# Patient Record
Sex: Male | Born: 1950 | Race: White | Hispanic: No | Marital: Single | Smoking: Current every day smoker
Health system: Southern US, Community
[De-identification: ages and names within clinical notes are randomized; demographics above are authoritative.]

## PROBLEM LIST (undated history)

## (undated) DIAGNOSIS — E079 Disorder of thyroid, unspecified: Secondary | ICD-10-CM

## (undated) DIAGNOSIS — I219 Acute myocardial infarction, unspecified: Secondary | ICD-10-CM

## (undated) DIAGNOSIS — I4891 Unspecified atrial fibrillation: Secondary | ICD-10-CM

## (undated) DIAGNOSIS — M549 Dorsalgia, unspecified: Secondary | ICD-10-CM

## (undated) DIAGNOSIS — E785 Hyperlipidemia, unspecified: Secondary | ICD-10-CM

## (undated) DIAGNOSIS — J449 Chronic obstructive pulmonary disease, unspecified: Secondary | ICD-10-CM

## (undated) DIAGNOSIS — I509 Heart failure, unspecified: Secondary | ICD-10-CM

## (undated) DIAGNOSIS — Z951 Presence of aortocoronary bypass graft: Secondary | ICD-10-CM

## (undated) HISTORY — PX: CORONARY ARTERY BYPASS GRAFT: SHX141

## (undated) HISTORY — PX: SHOULDER SURGERY: SHX246

## (undated) HISTORY — PX: CORONARY ANGIOPLASTY WITH STENT PLACEMENT: SHX49

## (undated) HISTORY — PX: CORONARY ANGIOPLASTY: SHX604

## (undated) HISTORY — PX: CHOLECYSTECTOMY: SHX55

---

## 2008-02-22 ENCOUNTER — Ambulatory Visit: Payer: Self-pay | Admitting: Gastroenterology

## 2008-02-26 ENCOUNTER — Ambulatory Visit: Payer: Self-pay | Admitting: Orthopedic Surgery

## 2008-03-08 HISTORY — PX: JOINT REPLACEMENT: SHX530

## 2008-04-22 ENCOUNTER — Ambulatory Visit: Payer: Self-pay | Admitting: Orthopedic Surgery

## 2008-04-30 ENCOUNTER — Inpatient Hospital Stay: Payer: Self-pay | Admitting: Orthopedic Surgery

## 2008-07-30 ENCOUNTER — Ambulatory Visit: Payer: Self-pay | Admitting: Orthopedic Surgery

## 2008-08-06 ENCOUNTER — Ambulatory Visit: Payer: Self-pay | Admitting: Orthopedic Surgery

## 2009-03-05 ENCOUNTER — Ambulatory Visit: Payer: Self-pay | Admitting: Orthopedic Surgery

## 2009-03-13 ENCOUNTER — Ambulatory Visit: Payer: Self-pay | Admitting: Orthopedic Surgery

## 2010-02-10 ENCOUNTER — Encounter: Payer: Self-pay | Admitting: Family Medicine

## 2010-03-08 ENCOUNTER — Encounter: Payer: Self-pay | Admitting: Family Medicine

## 2010-03-18 ENCOUNTER — Ambulatory Visit: Payer: Self-pay | Admitting: Cardiovascular Disease

## 2010-03-31 ENCOUNTER — Ambulatory Visit: Payer: Self-pay | Admitting: Family Medicine

## 2010-06-05 ENCOUNTER — Ambulatory Visit: Payer: Self-pay | Admitting: Orthopedic Surgery

## 2010-06-10 ENCOUNTER — Ambulatory Visit: Payer: Self-pay | Admitting: Orthopedic Surgery

## 2010-07-15 ENCOUNTER — Encounter: Payer: Self-pay | Admitting: Orthopedic Surgery

## 2011-02-23 ENCOUNTER — Ambulatory Visit: Payer: Self-pay | Admitting: Family Medicine

## 2011-03-26 ENCOUNTER — Encounter: Payer: Self-pay | Admitting: Family Medicine

## 2011-04-09 ENCOUNTER — Encounter: Payer: Self-pay | Admitting: Family Medicine

## 2011-05-07 ENCOUNTER — Encounter: Payer: Self-pay | Admitting: Family Medicine

## 2011-09-07 ENCOUNTER — Ambulatory Visit: Payer: Self-pay | Admitting: Pain Medicine

## 2011-09-13 ENCOUNTER — Ambulatory Visit: Payer: Self-pay | Admitting: Pain Medicine

## 2011-09-16 ENCOUNTER — Ambulatory Visit: Payer: Self-pay | Admitting: Pain Medicine

## 2011-10-12 ENCOUNTER — Ambulatory Visit: Payer: Self-pay | Admitting: Pain Medicine

## 2011-10-20 ENCOUNTER — Ambulatory Visit: Payer: Self-pay | Admitting: Pain Medicine

## 2011-11-16 ENCOUNTER — Ambulatory Visit: Payer: Self-pay | Admitting: Pain Medicine

## 2011-11-24 ENCOUNTER — Ambulatory Visit: Payer: Self-pay | Admitting: Pain Medicine

## 2011-11-30 ENCOUNTER — Ambulatory Visit: Payer: Self-pay | Admitting: Pain Medicine

## 2011-12-23 ENCOUNTER — Ambulatory Visit: Payer: Self-pay | Admitting: Pain Medicine

## 2012-01-03 ENCOUNTER — Ambulatory Visit: Payer: Self-pay | Admitting: Pain Medicine

## 2012-03-14 ENCOUNTER — Ambulatory Visit: Payer: Self-pay | Admitting: Pain Medicine

## 2012-03-22 ENCOUNTER — Ambulatory Visit: Payer: Self-pay | Admitting: Pain Medicine

## 2012-05-16 ENCOUNTER — Ambulatory Visit: Payer: Self-pay | Admitting: Pain Medicine

## 2012-06-07 ENCOUNTER — Ambulatory Visit: Payer: Self-pay | Admitting: Pain Medicine

## 2012-08-03 ENCOUNTER — Ambulatory Visit: Payer: Self-pay | Admitting: Pain Medicine

## 2012-08-16 ENCOUNTER — Ambulatory Visit: Payer: Self-pay | Admitting: Pain Medicine

## 2012-09-07 ENCOUNTER — Emergency Department: Payer: Self-pay | Admitting: Emergency Medicine

## 2012-09-07 LAB — CBC WITH DIFFERENTIAL/PLATELET
Basophil #: 0.1 10*3/uL (ref 0.0–0.1)
Eosinophil #: 0 10*3/uL (ref 0.0–0.7)
Eosinophil %: 0.2 %
HGB: 14.5 g/dL (ref 13.0–18.0)
Lymphocyte #: 1.4 10*3/uL (ref 1.0–3.6)
Lymphocyte %: 11.5 %
MCHC: 33.9 g/dL (ref 32.0–36.0)
Monocyte %: 8.5 %
Neutrophil #: 9.7 10*3/uL — ABNORMAL HIGH (ref 1.4–6.5)
Neutrophil %: 79.2 %
RBC: 4.96 10*6/uL (ref 4.40–5.90)
RDW: 13.6 % (ref 11.5–14.5)
WBC: 12.2 10*3/uL — ABNORMAL HIGH (ref 3.8–10.6)

## 2012-09-07 LAB — SYNOVIAL CELL COUNT + DIFF, W/ CRYSTALS
Basophil: 0 %
Nucleated Cell Count: 20313 /mm3
Other Cells BF: 0 %
Other Mononuclear Cells: 8 %

## 2012-09-07 LAB — BASIC METABOLIC PANEL
Anion Gap: 7 (ref 7–16)
Calcium, Total: 9.4 mg/dL (ref 8.5–10.1)
Co2: 24 mmol/L (ref 21–32)
Creatinine: 1.32 mg/dL — ABNORMAL HIGH (ref 0.60–1.30)
EGFR (African American): >60
Glucose: 130 mg/dL — ABNORMAL HIGH (ref 65–99)

## 2012-09-11 LAB — AEROBIC CULTURE

## 2012-10-20 ENCOUNTER — Emergency Department: Payer: Self-pay | Admitting: Emergency Medicine

## 2012-10-20 LAB — COMPREHENSIVE METABOLIC PANEL
Anion Gap: 9 (ref 7–16)
BUN: 13 mg/dL (ref 7–18)
Bilirubin,Total: 0.2 mg/dL (ref 0.2–1.0)
Calcium, Total: 9.5 mg/dL (ref 8.5–10.1)
Co2: 22 mmol/L (ref 21–32)
Creatinine: 0.98 mg/dL (ref 0.60–1.30)
EGFR (African American): >60
EGFR (Non-African Amer.): >60
Glucose: 127 mg/dL — ABNORMAL HIGH (ref 65–99)
Osmolality: 270 (ref 275–301)
SGOT(AST): 20 U/L (ref 15–37)
SGPT (ALT): 19 U/L (ref 12–78)

## 2012-10-20 LAB — CBC
HCT: 44.2 % (ref 40.0–52.0)
HGB: 15.3 g/dL (ref 13.0–18.0)
MCH: 29.3 pg (ref 26.0–34.0)
MCHC: 34.5 g/dL (ref 32.0–36.0)
MCV: 85 fL (ref 80–100)

## 2012-10-20 LAB — TROPONIN I: Troponin-I: 0.15 ng/mL — ABNORMAL HIGH

## 2012-10-25 DIAGNOSIS — E039 Hypothyroidism, unspecified: Secondary | ICD-10-CM | POA: Insufficient documentation

## 2012-10-25 DIAGNOSIS — I213 ST elevation (STEMI) myocardial infarction of unspecified site: Secondary | ICD-10-CM | POA: Insufficient documentation

## 2012-11-01 DIAGNOSIS — I2510 Atherosclerotic heart disease of native coronary artery without angina pectoris: Secondary | ICD-10-CM | POA: Insufficient documentation

## 2012-11-01 DIAGNOSIS — E782 Mixed hyperlipidemia: Secondary | ICD-10-CM | POA: Insufficient documentation

## 2012-11-26 ENCOUNTER — Inpatient Hospital Stay: Payer: Self-pay | Admitting: Internal Medicine

## 2012-11-26 LAB — CBC
MCHC: 33.4 g/dL (ref 32.0–36.0)
Platelet: 382 10*3/uL (ref 150–440)
RDW: 16.3 % — ABNORMAL HIGH (ref 11.5–14.5)
WBC: 8.6 10*3/uL (ref 3.8–10.6)

## 2012-11-26 LAB — COMPREHENSIVE METABOLIC PANEL
Albumin: 3.4 g/dL (ref 3.4–5.0)
Alkaline Phosphatase: 133 U/L (ref 50–136)
Anion Gap: 8 (ref 7–16)
BUN: 13 mg/dL (ref 7–18)
Calcium, Total: 9.5 mg/dL (ref 8.5–10.1)
Creatinine: 1 mg/dL (ref 0.60–1.30)
EGFR (Non-African Amer.): >60
Glucose: 134 mg/dL — ABNORMAL HIGH (ref 65–99)
Potassium: 3.8 mmol/L (ref 3.5–5.1)
SGOT(AST): 14 U/L — ABNORMAL LOW (ref 15–37)
SGPT (ALT): 19 U/L (ref 12–78)
Sodium: 134 mmol/L — ABNORMAL LOW (ref 136–145)
Total Protein: 6.9 g/dL (ref 6.4–8.2)

## 2012-11-26 LAB — CK TOTAL AND CKMB (NOT AT ARMC): CK-MB: 1.1 ng/mL (ref 0.5–3.6)

## 2012-11-27 DIAGNOSIS — R079 Chest pain, unspecified: Secondary | ICD-10-CM

## 2012-11-27 LAB — LIPID PANEL
Cholesterol: 79 mg/dL (ref 0–200)
Ldl Cholesterol, Calc: 34 mg/dL (ref 0–100)
Triglycerides: 112 mg/dL (ref 0–200)
VLDL Cholesterol, Calc: 22 mg/dL (ref 5–40)

## 2012-11-27 LAB — TROPONIN I
Troponin-I: 6.8 ng/mL — ABNORMAL HIGH
Troponin-I: 3.4 ng/mL — ABNORMAL HIGH

## 2012-11-27 LAB — CK TOTAL AND CKMB (NOT AT ARMC)
CK, Total: 107 U/L (ref 35–232)
CK-MB: 11.6 ng/mL — ABNORMAL HIGH (ref 0.5–3.6)

## 2012-11-28 LAB — HEMOGLOBIN: HGB: 11.1 g/dL — ABNORMAL LOW (ref 13.0–18.0)

## 2013-11-27 IMAGING — CR DG CHEST 2V
1 series · 2 of 2 positions shown · non-contrast
Comparison: none

REASON FOR EXAM: Chest Pain
COMMENTS:

PROCEDURE:     DXR - DXR CHEST PA (OR AP) AND LATERAL  - November 26, 2012  [DATE]
RESULT:     There is lung base haziness on the right concerning for
atelectasis. The lungs otherwise appear to be grossly clear. Cardiac
silhouette is normal. The bony and mediastinal structures are unremarkable.

[Series 1: w chest pa · 0.14mm/px · 2 of 2 slices shown]
[im 1/2]
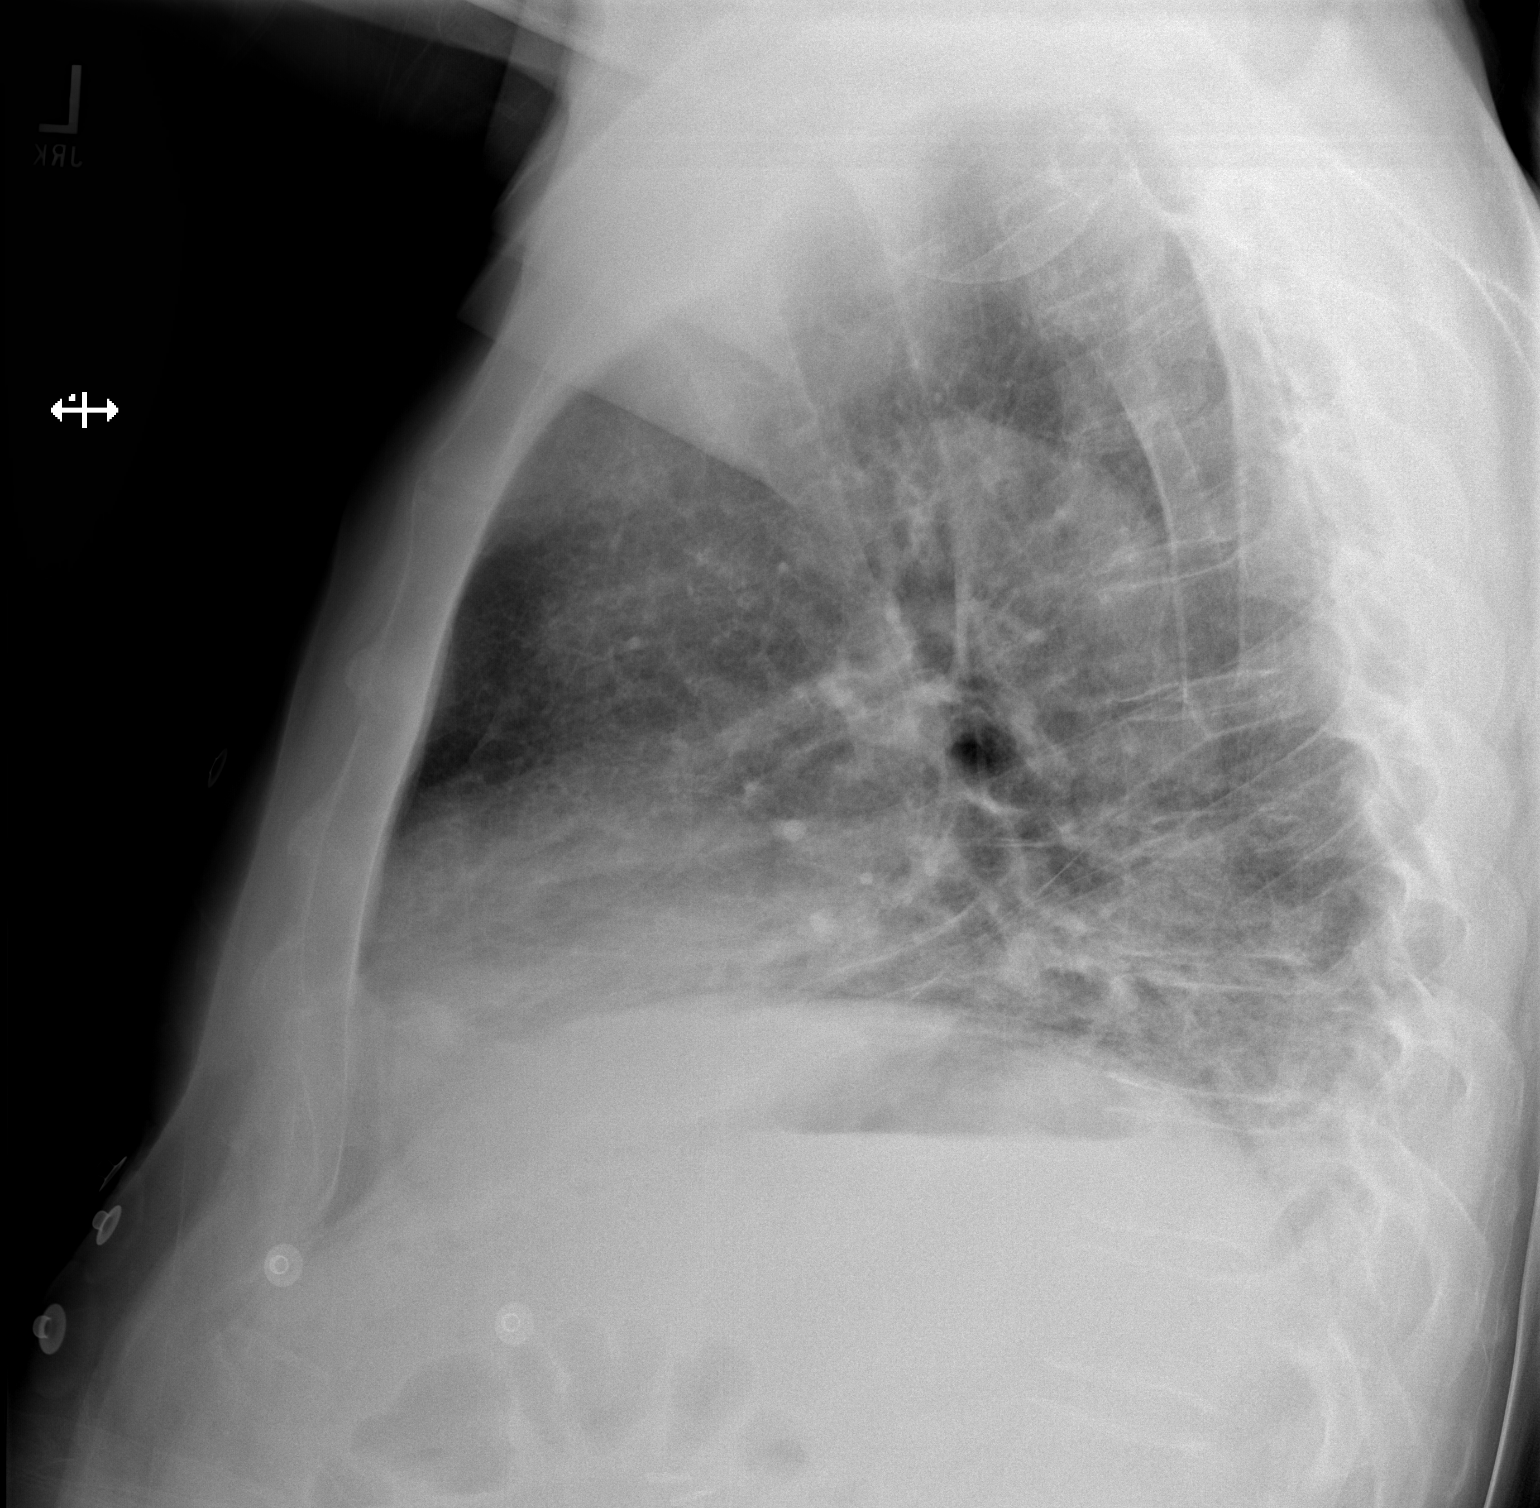
[im 2/2]
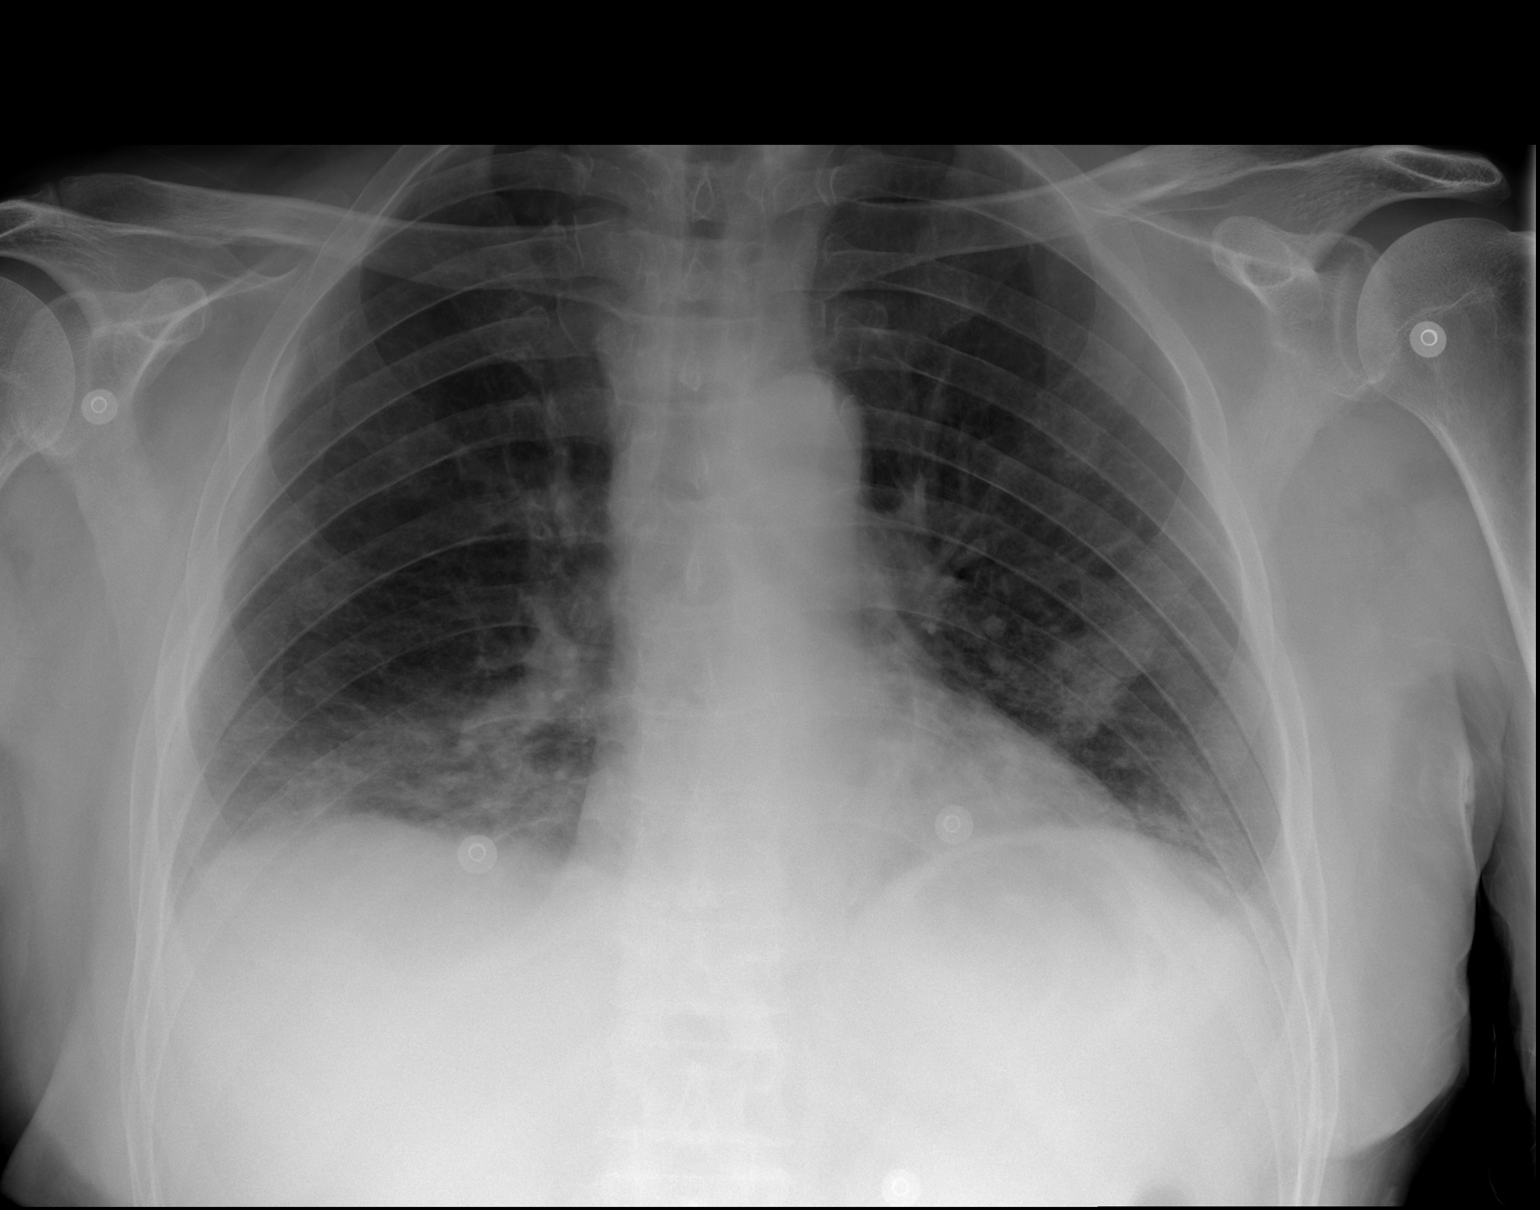

[2 of 2 positions shown; findings below may reference images not displayed]

IMPRESSION: 1. Lung base hazy density concerning for atelectasis. Minimal infiltrate at
the right lung base is not excluded.

[REDACTED]

## 2014-03-08 HISTORY — PX: JOINT REPLACEMENT: SHX530

## 2014-04-16 ENCOUNTER — Ambulatory Visit: Payer: Self-pay | Admitting: Orthopedic Surgery

## 2014-05-07 ENCOUNTER — Ambulatory Visit: Payer: Self-pay | Admitting: Orthopedic Surgery

## 2014-05-16 ENCOUNTER — Inpatient Hospital Stay: Payer: Self-pay | Admitting: Orthopedic Surgery

## 2014-05-31 ENCOUNTER — Emergency Department: Payer: Self-pay | Admitting: Emergency Medicine

## 2014-05-31 LAB — CBC WITH DIFFERENTIAL/PLATELET
Basophil #: 0 10*3/uL (ref 0.0–0.1)
Basophil %: 0.4 %
EOS ABS: 0.3 10*3/uL (ref 0.0–0.7)
Eosinophil %: 3.4 %
HCT: 38.2 % — AB (ref 40.0–52.0)
HGB: 13.2 g/dL (ref 13.0–18.0)
LYMPHS ABS: 2.2 10*3/uL (ref 1.0–3.6)
LYMPHS PCT: 24.4 %
MCH: 31.5 pg (ref 26.0–34.0)
MCHC: 34.6 g/dL (ref 32.0–36.0)
MCV: 91 fL (ref 80–100)
Monocyte #: 0.8 x10 3/mm (ref 0.2–1.0)
Monocyte %: 8.9 %
NEUTROS ABS: 5.6 10*3/uL (ref 1.4–6.5)
Neutrophil %: 62.9 %
Platelet: 485 10*3/uL — ABNORMAL HIGH (ref 150–440)
RBC: 4.2 10*6/uL — ABNORMAL LOW (ref 4.40–5.90)
RDW: 14.5 % (ref 11.5–14.5)
WBC: 9 10*3/uL (ref 3.8–10.6)

## 2014-05-31 LAB — BASIC METABOLIC PANEL
Anion Gap: 6 — ABNORMAL LOW (ref 7–16)
BUN: 17 mg/dL
CHLORIDE: 105 mmol/L
CO2: 23 mmol/L
CREATININE: 0.93 mg/dL
Calcium, Total: 9.1 mg/dL
GLUCOSE: 101 mg/dL — AB
Potassium: 3.8 mmol/L
Sodium: 134 mmol/L — ABNORMAL LOW

## 2014-05-31 LAB — TROPONIN I: Troponin-I: <0.03 ng/mL

## 2014-06-28 NOTE — H&P (Signed)
PATIENT NAME:  Bryce Haney, Bryce Haney MR#:  [id number] DATE OF BIRTH:  [birth year]  DATE OF ADMISSION: 11/26/2012  PRIMARY CARE PHYSICIAN: Salome Holmes, M.D.  REFERRING PHYSICIAN: Graciella Freer, M.D.  CHIEF COMPLAINT: Chest pain.   HISTORY OF PRESENT ILLNESS: The patient is 64 year old Caucasian male with past medical history of coronary artery disease, status post STEMI and stent placement on August 15 at [facility], COPD, degenerative joint disease, hypothyroidism, who is presenting to the ER with a chief complaint of chest pain. The patient is reporting that he started having chest pain at around 5:30 p.m. yesterday while he was resting. It was associated with nausea and diaphoresis. Denies any dizziness or loss of consciousness. Pain was on the left side of the chest which was pressure like and subsequently it was sharp in nature, associated with left shoulder weakness and numbness. He took aspirin yesterday morning and tried sublingual nitroglycerin  after he started having chest pressure with no significant improvement. As the chest pressure with pain was persistent in nature he called EMS. As soon as EMS came they gave 3 more aspirin and sublingual nitroglycerin. Subsequently his pain subsided from 10/10 to 4/10, and the patient was brought into the ER. In the ER, the patient's initial troponin is 0.03 but subsequent  one is at 1.20. Hospitalist team is called to admit the patient. A 12-lead EKG did not reveal any acute ST-T wave changes. During my examination, the patient was complaining of chest pressure which was 4/10. Repeat EKG did not reveal any acute ST-T wave changes either. Nitro paste is attached to his anterior chest wall and Lovenox 1 mg/kg subcutaneous x1 was given as troponin trending up. As there was a concern for possible re-stent stenosis call was placed to on-call cardiologist, Dr. Saralyn Pilar, and discussed with him regarding the patient's care. Dr. Saralyn Pilar has recommended to admit the  patient to [facility] as the patient did not have any acute ST-T wave changes. The patient is reporting that his allergic to morphine. After attaching 1 inch of nitro paste patient's chest pain significantly improved. No family members are at bedside.   PAST MEDICAL HISTORY: Coronary artery disease status post STEMI and one stent placement 1 month ago at [facility] on August 15, COPD . The patient quit smoking on August 15. Degenerative joint disease, hypothyroidism.   PAST SURGICAL HISTORY: Right shoulder surgery, right knee repair, stent placement at [facility] on August 15.   ALLERGIES:  MORPHINE.   PSYCHOSOCIAL HISTORY: Lives alone. He quit smoking on August 15.  Prior to that, he smoked 1 pack a day for 16 years. Denies any alcohol or illicit drug usage.   FAMILY HISTORY: Both parents have heart problems.   HOME MEDICATIONS: Plavix 75 mg once daily, aspirin 81 mg once daily, mirtazapine 30 mg once daily, levothyroxine 175 mcg once daily, gabapentin 300 mg 1 capsule p.o. once daily, Coreg 6.25 mg twice a day, atorvastatin 80 mg once daily, aspirin 81 mg once daily.   REVIEW OF SYSTEMS:   CONSTITUTIONAL: Denies any fever or fatigue.  EYES: Denies blurry vision or glaucoma.  EARS, NOSE, THROAT: No epistaxis or discharge.  RESPIRATION: Denies cough. Has a chronic history of COPD.  CARDIOVASCULAR: Complaining of chest pain with pressure. Denies any edema or syncope.  GASTROINTESTINAL: Denies nausea, vomiting, diarrhea, abdominal pain.  GENITOURINARY: No dysuria or hematuria.  ENDOCRINE: Denies polyuria, nocturia. Has chronic hypothyroidism.  HEMATOLOGIC AND LYMPHATIC: Denies anemia, easy bruising, bleeding.  INTEGUMENTARY: No acne, rash, lesions.  MUSCULOSKELETAL: Has chronic back pain and sees pain management doctor. Denies any gout.  NEUROLOGIC: No vertigo or ataxia.  PSYCHIATRIC: No ADD, OCD.   PHYSICAL EXAMINATION:  VITAL SIGNS: Temperature 98.2, pulse 61, respirations 18, blood pressure  146/71, pulse oximetry 99% on 2 liters.  GENERAL APPEARANCE: Not under acute distress. Moderately built and nourished, answering questions appropriately.   HEENT: Normocephalic, atraumatic. Pupils are equally reacting to light and accommodation. No scleral icterus. No conjunctival injection. No sinus tenderness. No postnasal drip. Moist mucous membranes.  NECK: Supple. No JVD. No thyromegaly.  LUNGS: Clear to auscultation bilaterally. No wheezing.  CARDIAC: S1 and S2 normal.  Regular rate and rhythm. No murmurs and gallops.No chest wall tenderness on palpation.  GASTROINTESTINAL: Soft. Bowel sounds are positive in all four quadrants. Nontender, nondistended. No hepatosplenomegaly.  NEUROLOGIC: Awake, alert, oriented x3. Motor and sensory grossly intact. Reflexes are 2+.  EXTREMITIES: No edema. No cyanosis. No clubbing.  SKIN: Warm to touch. Normal turgor. No rashes. No lesions.  MUSCULOSKELETAL: No joint effusion, tenderness or erythema.   LABS AND IMAGING STUDIES: Chest x-ray within normal limits. A 12-lead EKG: Initial one was done at 19 hours 39 minutes which has revealed normal sinus rhythm at 62 beats per minute, normal PR and QRS intervals interval, nonspecific ST-T wave changes. Repeat EKG was done at three hours 34 minutes on September 22 which has revealed sinus bradycardia at 58 beats per minute but normal PR and QRS interval, nonspecific ST-T wave changes. CK total is 23, CPK-MB 1.1. Troponin 0.03. A repeat troponin is at 1.20. LFTs are within normal range. Serum glucose 154, BNP 240, BUN 13, creatinine 1.0, sodium 134, potassium 3.8, chloride 105, CO2 of 21. GFR greater than 60, serum osmolality 270. Calcium 9.5. WBC 8.6, hemoglobin 11.3, hematocrit 33.7, platelets 382.   ASSESSMENT AND PLAN: A 64 year old Caucasian male with a past medical history of ST- segment myocardial infarction, status post stent placement at [facility] on August is coming with chest pain, will be admitted with the  following assessment and plan.  1.  Non-ST-segment myocardial infarction. Will admit him to Critical Care Unit. We will cycle cardiac biomarkers. The patient will be on acute coronary syndrome protocol with oxygen, nitroglycerin, aspirin, beta blocker and statin. The patient is allergic to morphine. We will provide him Lovenox 1 mg/kg subcutaneous q.12 hours. Cardiology consult is placed and discussed with Dr. Saralyn Pilar who agrees with the current plan.  2.  Chronic obstructive pulmonary disease, not in acute exacerbation. We will provide him DuoNebs as needed basis.  3.  Hypothyroidism. Resume Synthroid.  4.  Degenerative joint disease. Pain management as needed.  5.  He is full code. The patient's nephew, Mr. Lanny Cramp, is his medical power of attorney.   Total critical care Time Spent: 60 minutes.   Diagnosis and plan of care was discussed in detail with the patient. He is aware of the plan.   ____________________________ Nicholes Mango, MD ag:ja D: 11/27/2012 01:22:33 ET T: 11/27/2012 06:03:04 ET JOB#: [id number]  cc: Nicholes Mango, MD, <Dictator> Isaias Cowman, MD Nicholes Mango MD ELECTRONICALLY SIGNED 12/08/2012 6:56

## 2014-06-28 NOTE — Consult Note (Signed)
PATIENT NAME:  Bryce Haney, DOWNS MR#:  [id number] DATE OF BIRTH:  07/08/1950  DATE OF CONSULTATION:  11/27/2012  REFERRING PHYSICIAN:  Dr. Margaretmary Eddy.  CONSULTING PHYSICIAN:  Dwayne D. Callwood, MD  INDICATION: Non-Q-wave myocardial infarction.   PRIMARY CARE PHYSICIAN: Salome Holmes, MD.  HISTORY OF PRESENT ILLNESS: Bryce Haney is a 64 year old white male with recent past history of acute myocardial infarction with stent placement at [facility] August 15. He has COPD, DJD and hypothyroidism. He did reasonably well at [facility]. He was discharged home and followed up with Dr. Nehemiah Massed as an outpatient and was scheduled for outpatient cardiac studies. Today, he was admitted. This time he complained of left arm numbness and chest pain, not as bad as when he had his STEMI. He had some nausea and diaphoresis. Finally, EMS brought him in. He got aspirin and sublingual nitroglycerin. Pain improved from 10 out of 10 to 4 out of 10. Initial troponin was 0.03. It subsequently increased to 1.2 and increased even higher to 6. EKG had nonspecific ST-T wave changes. He was placed on anticoagulation and advised further evaluation and management. Left arm numbness and pain improved, but with positive troponins and recent cardiac history, he was advised to be admitted for further evaluation and care.   REVIEW OF SYSTEMS: Denies blackout spells or syncope. He has had mild nausea. No vomiting. No fever, no chills. He has had some sweats. No weight loss. No weight gain. No hemoptysis or hematemesis. No bright red blood per rectum. No vision change or hearing change. Denies sputum production or cough.   PAST MEDICAL HISTORY: Notable for coronary artery disease, status post STEMI, past history of smoking, DJD and hypothyroidism.   PAST SURGICAL HISTORY: Angioplasty and stenting at [facility] August 15, right shoulder surgery and knee repair.   ALLERGIES: MORPHINE.   SOCIAL HISTORY: Lives alone. Quit smoking about a month ago. Denies  significant alcohol consumption.   FAMILY HISTORY: Heart disease.   MEDICATIONS: Plavix 75 mg a day, aspirin 81 mg day, mirtazapine 30 mg a day, levothyroxine 175 mcg a day, gabapentin 300 mg once a day, Coreg 6.25 mg twice a day and atorvastatin 80 mg once a day.   PHYSICAL EXAMINATION:  VITAL SIGNS: Blood pressure 150/70, pulse 65, respiratory rate 18, afebrile.  HEENT: Normocephalic, atraumatic. Pupils equal and reactive to light.  NECK: Supple. No significant JVD, bruits or adenopathy.  LUNGS: Clear to auscultation and percussion. No significant wheezing. He had some mild rhonchi, no rales.  HEART: Regular rate and rhythm. Systolic ejection murmur left sternal border. Positive S4.  ABDOMEN: Benign.  EXTREMITIES: Within normal limits.  NEUROLOGIC: Intact.  SKIN: Normal.   LABORATORIES: Again, his EKG showed normal sinus rhythm, rate of 70, nonspecific ST-T changes. Chest x-ray unremarkable. Total CK was normal. Initial troponin 0.03, increased to 1.2. LFTs were normal. Glucose 154. BNP 240. MET-B unremarkable. White count 8.6, hemoglobin 11, hematocrit 33.7, platelet count 382. Follow up troponin was 6.   ASSESSMENT:  1.  Non-Q-wave myocardial infarction.  2.  Unstable angina.  3.  Coronary artery disease. 4.  Chronic obstructive pulmonary disease.  5.  Hypothyroidism. 6.  Degenerative joint disease.  7.  Past history of smoking. 8.  Recent history of angioplasty and stent. The patient states to be complaint with Plavix.   PLAN: I agree with admit. Continue to rule out for myocardial infarction. Followup cardiac enzymes. Followup EKGs. Would switch to heparin and Integrilin. Echocardiogram will be helpful. Continue beta blockers for now. Continue  Lipitor for lipid management. Continue ACE inhibitor therapy. Would also consider proceeding with cardiac catheter since he had non-Q-wave myocardial infarction and recent myocardial infarction. Will cath within intention to treat and  intervene as necessary. Would also recommend patient continue to refrain from smoking. We will base further evaluation on results of the catheterization.  ____________________________ Loran Senters Clayborn Bigness, MD ddc:aw D: 11/27/2012 12:33:12 ET T: 11/27/2012 13:15:28 ET JOB#: [id number]  cc: Dwayne D. Clayborn Bigness, MD, <Dictator> Yolonda Kida MD ELECTRONICALLY SIGNED 12/27/2012 10:34

## 2014-06-28 NOTE — Discharge Summary (Signed)
PATIENT NAME:  Bryce Haney, Bryce Haney MR#:  [id number] DATE OF BIRTH:  [birth year]  DATE OF ADMISSION:  11/26/2012 DATE OF DISCHARGE: 11/28/2012   DISCHARGE DIAGNOSES: 1. Chest pain secondary to angina.  2. Coronary artery disease with recent stent.  3. Hypertension.  4. Hypothyroidism.  5. Hyperlipidemia.  6. Depression.   DISCHARGE MEDICATIONS: 1. Neurontin 300 mg p.o. daily.  2. Ranitidine 150 mg p.o. daily.  3 omeprazole  20 mg b.i.d.  4. Levothyroxine 175 mcg p.o. daily.  >  mirtazapine  30 mg  p.o. daily.  6. Atorvastatin 80 mg p.o. daily.  7. Plavix 75 mg p.o. daily.  8. Lisinopril 5 mg p.o. daily.  9. Ferrous sulfate 325 mg 2 tablets p.o.  b.i.d.  10. Coreg 6.25 mg p.o. b.i.d.  11. Aspirin 81 mg p.o. daily.  12. Nitrostat sublingual as needed for chest pain.   CONSULTATIONS: Cardiology consult with Dr. Clayborn Bigness.   DIET: Low sodium diet.  PRIMARY DOCTOR: Dr. Salome Holmes  PROCEDURES: Cardiac cath report.  Angiogram report says EF 50% to 55% with no significant obstructive coronary artery disease. The patient has a patent stent to LAD and normal ejection fraction. The patient advised to have medical management and okay to discharge home as per the Cardiology, and the patient advised to follow up with the cardiologist in [place] in about 10 days and  also follow with primary doctor.   HOSPITAL COURSE: A 64 year old male patient admitted for chest pain. The patient had a recent stent in 10/20/2012 at [facility].  PMH significant for COPD, hypothyroidism, and depression, and the patient had left-sided chest pain and heaviness. The patient's initial troponins were negative and second troponin went up to 1.20, and third one is 6.80. CPK-MB was also elevated to 18.5 CK total was 157. The patient was admitted to ICU, started on heparin drip and also Integrilin drip. The patient had an angiogram yesterday which showed normal coronaries and also patent stent to LAD. The patient's troponins did come  down to 3.4 yesterday afternoon, and CK-MB also decreased to 11.6. The patient denies any chest pain today. The patient is very eager to go home. The patient cleared by Cardiology yesterday after the angiogram for discharge.  Vitals today, heart rate 70, blood pressure 128/85, and temperature 97 sats 98% on room air. Advised to quit smoking and (  discharge the patient  home. Advised to be compliant with his aspirin and Plavix and also blood pressure medications.   TIME SPENT ON DISCHARGE PREPARATION: More than 30 minutes.   ____________________________ Epifanio Lesches, MD sk:sg D: 11/28/2012 08:35:00 ET T: 11/28/2012 09:03:49 ET JOB#: [id number]  cc: Salome Holmes, MD Dwayne D. Clayborn Bigness, MD Epifanio Lesches, MD, <Dictator>    Epifanio Lesches MD ELECTRONICALLY SIGNED 12/15/2012 14:50

## 2014-07-01 LAB — SURGICAL PATHOLOGY

## 2014-07-07 NOTE — Op Note (Signed)
PATIENT NAME:  Bryce Haney, Bryce Haney MR#:  [id number] DATE OF BIRTH:  06/08/1950  DATE OF PROCEDURE:  05/16/2014  PREOPERATIVE DIAGNOSIS:  Left knee primary osteoarthritis.   POSTOPERATIVE DIAGNOSIS:  Left knee primary osteoarthritis.   PROCEDURE:  Left total knee replacement.   ANESTHESIA:  Spinal.   SURGEON:  Laurene Footman, MD   ASSISTANT:  Ronney Asters, PA-C  DESCRIPTION OF PROCEDURE:  The patient was brought to the operating room, and after adequate anesthesia was obtained, the left leg was prepped and draped in the usual sterile fashion with a tourniquet applied to the upper thigh. After patient identification and timeout procedures were completed, the tourniquet was raised to 300 mmHg. A midline skin incision was made with the knee in flexion followed by medial parapatellar arthrotomy. Next, inspection of the knee revealed moderate synovitis in areas in each compartment with exposed bone with very abnormal appearance with a roughened appearance to the articular cartilage. The ACL and PCL were excised along with the fat pad. Proximal tibial exposure was carried out and the [place] cutting block applied with a proximal tibial cut created. The proximal tibial cut was removed as one piece. The femur was approached in a similar fashion, removing cartilage around the area for the guide. When the guide was applied, the distal femoral cut was made with a 4+ cutting block applied based on preoperative templating. Anterior, posterior, and chamfer cuts were then carried out with no notching. The residual posterior horns of the menisci were excised at this time. The wounds were irrigated and then trial placed with a size 4 tibia applied centered over the tibia, pinned in place, and drilling carried out for a short stem. Next, the keel punch was placed with a short stem and the femoral trial placed with an 11 mm insert. There was excellent stability and full extension passively. The distal femoral drill holes  were made and the trochlear groove cut carried out. These trials were all then removed. The patella was then cut using the patellar cutting guide and sized to a size 3 after drill holes were made and trial placed. The tourniquet was let down and hemostasis checked with electrocautery without any significant bleeders. Next, the knee was infiltrated with Sensorcaine 0.25% with epinephrine as well as a dilute Exparel solution. The tourniquet was raised and the knee was thoroughly irrigated and bony surfaces dried. The components were then placed with the tibial component cemented into place first, followed by the 11 mm insert with set screw. The femoral component was impacted into place again with cement. The knee was held in extension. The tibial component was clamped. After the cement had set, excess cement was removed. There was excellent patellofemoral tracking. The tourniquet was let down, and the arthrotomy was repaired using a heavy quill suture, 2-0 quill subcutaneously, and skin staples. Diluted Betadine irrigation was used prior to wound closure. Xeroform, 4 x 4's, Webril, ABDs, Polar Care, and Ace wrap were applied. The patient was sent to the recovery room in stable condition.   ESTIMATED BLOOD LOSS:  100 mL.   TOURNIQUET TIME:  51 minutes at 300 mmHg.   IMPLANTS:  Medacta GMK Sphere 4+, left, 11 mm tibial insert size 4 with a left fixed tibial component and an 11 x 30 mm stem, size 3 patella, all components again cemented.    ____________________________ Laurene Footman, MD mjm:nb D: 05/16/2014 19:20:07 ET T: 05/17/2014 01:57:46 ET JOB#: [id number]  cc: Laurene Footman, MD, <Dictator> Iszabella Hebenstreit  Arnell Asal MD ELECTRONICALLY SIGNED 05/17/2014 8:01

## 2014-07-07 NOTE — Discharge Summary (Signed)
PATIENT NAME:  Bryce Haney, Bryce Haney MR#:  [id number] DATE OF BIRTH:  06-19-1950  DATE OF ADMISSION:  05/16/2014 DATE OF DISCHARGE:  05/19/2014  ADMITTING DIAGNOSIS: Left knee osteoarthritis.   DISCHARGE DIAGNOSIS: Left knee osteoarthritis.   PROCEDURE: Left total knee arthroplasty.   ANESTHESIA: Spinal.   SURGEON: Laurene Footman, MD   ASSISTANT: Ronney Asters, PA-C  ESTIMATED BLOOD LOSS:  100 mL   TOURNIQUET TIME: 51 minutes at 300 mmHg   IMPLANTS: Medacta GMK sphere 4+, left, 11 mm tibial insert size 4 with a left fixed tibial component and an 11 x 30 mm stem, size 3 patella, all components again cemented.   HISTORY: The patient is a 64 year old male who presents for evaluation of left knee pain. He has had years of left knee pain that has progressively gotten worse. The pain is located along the medial and lateral aspects of the knee. The pain is described as an ache that is increased with activity. He gets minimal relief with rest. The patient's knee has progressively gotten worse in regards to stability. The knee will constantly give out on him. He has frequent knee effusions that limit his range of motion. He takes occasional Tylenol and has used ice for pain. He is also using a knee brace to help with stability. He ambulates with a cane. The patient's pain is interfering with activities of daily living. He has had a cortisone injection with less than 1 week's worth of relief as well as Synvisc with less than 3 or 4 weeks' worth relief.   PHYSICAL EXAMINATION:  MUSCULOSKELETAL: Left knee: Examination of the left knee shows the patient has mild swelling with no warmth, erythema or effusion. Range of motion is -8 to 120 degrees. There is discomfort with knee range of motion. The knee is stable to stress testing. He is neurovascularly intact in the left lower extremity.  HEART: Regular rate and rhythm.  LUNGS: Clear to auscultation bilaterally. No wheezing, rales or rhonchi.   HOSPITAL  COURSE: The patient was admitted to the hospital on 05/16/2014. He had surgery that same day and was brought to the orthopedic floor from the PACU in stable condition. On postoperative day 1, the patient's labs were stable. Hemoglobin was stable. Vital signs were stable, and he had slow progress with physical therapy due to pain. On postoperative day 2, the pain did improve, and the patient made excellent progress with physical therapy, ambulating over 200 feet. The patient was eating well, tolerating p.o. He continued to have normal vital signs, and, by postoperative day 3, patient was making progress with physical therapy but it was recommneded that patient undergo PT at SNF. Patient was evaluated for sinus tachycardia, medicine recommened she start metoprolol. Patients HR did improve. She tolerated the medicine well. On post op day 4, patient was stable and ready for discharge to rehab facility CONDITION AT DISCHARGE: Stable.   DISCHARGE INSTRUCTIONS: The patient may increase weight-bearing on the affected extremity. Elevate the affected foot or leg on 1 or 2 pillows with the foot higher than the knee. Thigh-high TED hose on both legs, remove at bedtime, replace on arising the next morning. Elevate the heels off the bed. Use incentive spirometer every hour while awake. Encourage cough and deep breathing. The patient may resume a regular diet as tolerated. Continue using Polar Care unit maintaining a temperature between 40 and 50 degrees. Do not get the dressing or bandage wet or dirty. Call [facility] if the dressing gets water  under it. Leave the dressing on. Call [facility]oped[facility] if any of the following occur: Bright red bleeding from the incision wound, fever of 101.5 degrees, redness, swelling, or drainage at the incision site. Call [facility]oped[facility] if you experience any increased leg pain, numbness or weakness in the  legs or bowel or bladder symptoms. Home physical therapy has been arranged for  continuation of rehab program. Please call [place] if a therapist has not contacted you within 48 hours of your return home. Call [facility] for follow-up appointment. The patient needs be seen in [place] in 2 weeks.   DISCHARGE MEDICATIONS: Please see the list of discharge medications on discharge instructions.    ____________________________ T. Rachelle Hora, PA-C tcg:JT D: 05/19/2014 08:26:46 ET T: 05/19/2014 15:39:36 ET JOB#: [id number]  cc: T. Rachelle Hora, PA-C, <Dictator> Bryce Haney [place] ELECTRONICALLY SIGNED 05/29/2014 15:18

## 2014-07-24 ENCOUNTER — Ambulatory Visit
Admission: RE | Admit: 2014-07-24 | Discharge: 2014-07-24 | Disposition: A | Discharge status: Home / Self Care | Payer: Medicaid Other | Source: Ambulatory Visit | Attending: Internal Medicine | Admitting: Internal Medicine

## 2014-07-24 ENCOUNTER — Encounter
Admission: RE | Disposition: A | Discharge status: Home / Self Care | Payer: Medicaid Other | Source: Ambulatory Visit | Attending: Internal Medicine

## 2014-07-24 ENCOUNTER — Encounter: Payer: Self-pay | Admitting: *Deleted

## 2014-07-24 DIAGNOSIS — Z955 Presence of coronary angioplasty implant and graft: Secondary | ICD-10-CM | POA: Diagnosis not present

## 2014-07-24 DIAGNOSIS — E039 Hypothyroidism, unspecified: Secondary | ICD-10-CM | POA: Diagnosis not present

## 2014-07-24 DIAGNOSIS — F329 Major depressive disorder, single episode, unspecified: Secondary | ICD-10-CM | POA: Insufficient documentation

## 2014-07-24 DIAGNOSIS — K219 Gastro-esophageal reflux disease without esophagitis: Secondary | ICD-10-CM | POA: Insufficient documentation

## 2014-07-24 DIAGNOSIS — Z720 Tobacco use: Secondary | ICD-10-CM | POA: Diagnosis not present

## 2014-07-24 DIAGNOSIS — E782 Mixed hyperlipidemia: Secondary | ICD-10-CM | POA: Insufficient documentation

## 2014-07-24 DIAGNOSIS — I252 Old myocardial infarction: Secondary | ICD-10-CM | POA: Insufficient documentation

## 2014-07-24 DIAGNOSIS — I25119 Atherosclerotic heart disease of native coronary artery with unspecified angina pectoris: Secondary | ICD-10-CM | POA: Insufficient documentation

## 2014-07-24 DIAGNOSIS — J449 Chronic obstructive pulmonary disease, unspecified: Secondary | ICD-10-CM | POA: Diagnosis not present

## 2014-07-24 DIAGNOSIS — Z79899 Other long term (current) drug therapy: Secondary | ICD-10-CM | POA: Insufficient documentation

## 2014-07-24 DIAGNOSIS — I10 Essential (primary) hypertension: Secondary | ICD-10-CM | POA: Diagnosis not present

## 2014-07-24 DIAGNOSIS — M109 Gout, unspecified: Secondary | ICD-10-CM | POA: Insufficient documentation

## 2014-07-24 DIAGNOSIS — Z7982 Long term (current) use of aspirin: Secondary | ICD-10-CM | POA: Insufficient documentation

## 2014-07-24 DIAGNOSIS — R079 Chest pain, unspecified: Secondary | ICD-10-CM | POA: Diagnosis present

## 2014-07-24 HISTORY — DX: Acute myocardial infarction, unspecified: I21.9

## 2014-07-24 HISTORY — PX: CARDIAC CATHETERIZATION: SHX172

## 2014-07-24 LAB — GLUCOSE, CAPILLARY: GLUCOSE-CAPILLARY: 94 mg/dL (ref 65–99)

## 2014-07-24 SURGERY — LEFT HEART CATH
Anesthesia: Moderate Sedation

## 2014-07-24 MED ORDER — SODIUM CHLORIDE 0.9 % IJ SOLN: 3.0000 mL | INTRAMUSCULAR | Status: DC | PRN

## 2014-07-24 MED ORDER — IOHEXOL 300 MG/ML  SOLN
INTRAMUSCULAR | Status: DC | PRN
  Administered 2014-07-24: 100 mL via INTRA_ARTERIAL
  Administered 2014-07-24: 10 mL via INTRA_ARTERIAL
  Administered 2014-07-24: 30 mL via INTRA_ARTERIAL

## 2014-07-24 MED ORDER — SODIUM CHLORIDE 0.9 % IV SOLN: 250.0000 mL | INTRAVENOUS | Status: DC | PRN

## 2014-07-24 MED ORDER — MIDAZOLAM HCL 2 MG/2ML IJ SOLN
INTRAMUSCULAR | Status: AC
  Filled 2014-07-24: qty 2

## 2014-07-24 MED ORDER — FENTANYL CITRATE (PF) 100 MCG/2ML IJ SOLN
INTRAMUSCULAR | Status: AC
  Filled 2014-07-24: qty 2

## 2014-07-24 MED ORDER — MIDAZOLAM HCL 2 MG/2ML IJ SOLN
INTRAMUSCULAR | Status: DC | PRN
  Administered 2014-07-24: 2 mg via INTRAVENOUS

## 2014-07-24 MED ORDER — ACETAMINOPHEN 325 MG PO TABS: 650.0000 mg | ORAL_TABLET | ORAL | Status: DC | PRN

## 2014-07-24 MED ORDER — SODIUM CHLORIDE 0.9 % IJ SOLN: 3.0000 mL | Freq: Two times a day (BID) | INTRAMUSCULAR | Status: DC

## 2014-07-24 MED ORDER — SODIUM CHLORIDE 0.9 % IV SOLN: INTRAVENOUS | Status: DC

## 2014-07-24 MED ORDER — ONDANSETRON HCL 4 MG/2ML IJ SOLN: 4.0000 mg | Freq: Four times a day (QID) | INTRAMUSCULAR | Status: DC | PRN

## 2014-07-24 SURGICAL SUPPLY — 9 items
CATH INFINITI 5FR ANG PIGTAIL (CATHETERS) ×3 IMPLANT
CATH INFINITI 5FR JL4 (CATHETERS) ×3 IMPLANT
CATH INFINITI JR4 5F (CATHETERS) ×3 IMPLANT
DEVICE CLOSURE MYNXGRIP 5F (Vascular Products) ×3 IMPLANT
KIT MANI 3VAL PERCEP (MISCELLANEOUS) ×3 IMPLANT
NEEDLE PERC 18GX7CM (NEEDLE) ×3 IMPLANT
PACK CARDIAC CATH (CUSTOM PROCEDURE TRAY) ×3 IMPLANT
SHEATH PINNACLE 5F 10CM (SHEATH) ×3 IMPLANT
WIRE EMERALD 3MM-J .035X150CM (WIRE) ×3 IMPLANT

## 2014-07-24 NOTE — Progress Notes (Signed)
Discharge instructions reviewed with patient; verbalizes understanding.

## 2014-07-24 NOTE — Discharge Instructions (Signed)

## 2014-11-12 ENCOUNTER — Encounter: Payer: Self-pay | Admitting: Emergency Medicine

## 2014-11-12 ENCOUNTER — Emergency Department
Admission: EM | Admit: 2014-11-12 | Discharge: 2014-11-12 | Disposition: A | Discharge status: Home / Self Care | Payer: Medicaid Other | Attending: Emergency Medicine | Admitting: Emergency Medicine

## 2014-11-12 DIAGNOSIS — Z720 Tobacco use: Secondary | ICD-10-CM | POA: Diagnosis not present

## 2014-11-12 DIAGNOSIS — G8929 Other chronic pain: Secondary | ICD-10-CM | POA: Diagnosis not present

## 2014-11-12 DIAGNOSIS — M549 Dorsalgia, unspecified: Secondary | ICD-10-CM

## 2014-11-12 DIAGNOSIS — M545 Low back pain: Secondary | ICD-10-CM | POA: Insufficient documentation

## 2014-11-12 DIAGNOSIS — M62830 Muscle spasm of back: Secondary | ICD-10-CM | POA: Diagnosis not present

## 2014-11-12 HISTORY — DX: Dorsalgia, unspecified: M54.9

## 2014-11-12 MED ORDER — DIAZEPAM 5 MG/ML IJ SOLN
5.0000 mg | Freq: Once | INTRAMUSCULAR | Status: AC
  Administered 2014-11-12: 5 mg via INTRAVENOUS
  Filled 2014-11-12: qty 2

## 2014-11-12 MED ORDER — PREDNISONE 10 MG PO TABS: 10.0000 mg | ORAL_TABLET | Freq: Every day | ORAL | Status: DC

## 2014-11-12 MED ORDER — KETOROLAC TROMETHAMINE 30 MG/ML IJ SOLN
30.0000 mg | Freq: Once | INTRAMUSCULAR | Status: AC
  Administered 2014-11-12: 30 mg via INTRAVENOUS
  Filled 2014-11-12: qty 1

## 2014-11-12 MED ORDER — DEXAMETHASONE SODIUM PHOSPHATE 10 MG/ML IJ SOLN
10.0000 mg | Freq: Once | INTRAMUSCULAR | Status: AC
  Administered 2014-11-12: 10 mg via INTRAVENOUS
  Filled 2014-11-12: qty 1

## 2014-11-12 MED ORDER — DIAZEPAM 5 MG PO TABS: 5.0000 mg | ORAL_TABLET | Freq: Three times a day (TID) | ORAL | Status: DC | PRN

## 2014-11-12 NOTE — ED Provider Notes (Signed)
[facility] Emergency Department Provider Note     Time seen: ----------------------------------------- 11:09 AM on 11/12/2014 -----------------------------------------    I have reviewed the triage vital signs and the nursing notes.   HISTORY  Chief Complaint Back Pain    HPI Bryce Haney is a 64 y.o. male who presents to ER with lower back pain for last 2 months. Patient states been worse over the last week, worse with movement. Patient states essentially he's been confined to his recliner for the past several days. States he has run out of his gabapentin that he typically takes, does not like strong pain medicine he states. His pain goes down both legs   Past Medical History  Diagnosis Date  . Myocardial infarction   . Back pain     There are no active problems to display for this patient.   Past Surgical History  Procedure Laterality Date  . Coronary angioplasty    . Cardiac catheterization N/A 07/24/2014    Procedure: Left Heart Cath;  Surgeon: Corey Skains, MD;  Location: [facility];  Service: Cardiovascular;  Laterality: N/A;    Allergies Morphine and related  Social History Social History  Substance Use Topics  . Smoking status: Current Every Day Smoker -- 0.50 packs/day for 20 years  . Smokeless tobacco: None  . Alcohol Use: No    Review of Systems Constitutional: Negative for fever. Eyes: Negative for visual changes. ENT: Negative for sore throat. Cardiovascular: Negative for chest pain. Respiratory: Negative for shortness of breath. Gastrointestinal: Negative for abdominal pain, vomiting and diarrhea. Genitourinary: Negative for dysuria. Musculoskeletal: Positive for low back pain Skin: Negative for rash. Neurological: Negative for headaches, focal weakness or numbness.  10-point ROS otherwise negative.  ____________________________________________   PHYSICAL EXAM:  VITAL SIGNS: ED Triage Vitals   Enc Vitals Group     BP 11/12/14 1027 128/69 mmHg     Pulse Rate 11/12/14 1027 56     Resp 11/12/14 1027 20     Temp 11/12/14 1027 97.6 F (36.4 C)     Temp Source 11/12/14 1027 Oral     SpO2 11/12/14 1027 99 %     Weight 11/12/14 1027 195 lb (88.451 kg)     Height 11/12/14 1027 6' (1.829 m)     Head Cir --      Peak Flow --      Pain Score 11/12/14 1028 10     Pain Loc --      Pain Edu? --      Excl. in [place]? --     Constitutional: Alert and oriented. Well appearing and in no distress. Eyes: Conjunctivae are normal. PERRL. Normal extraocular movements. ENT   Head: Normocephalic and atraumatic.   Nose: No congestion/rhinnorhea.   Mouth/Throat: Mucous membranes are moist.   Neck: No stridor. Cardiovascular: Normal rate, regular rhythm. Normal and symmetric distal pulses are present in all extremities. No murmurs, rubs, or gallops. Respiratory: Normal respiratory effort without tachypnea nor retractions. Breath sounds are clear and equal bilaterally. No wheezes/rales/rhonchi. Gastrointestinal: Soft and nontender. No distention. No abdominal bruits.  Musculoskeletal: Decreased range of motion of both lower extremities and back. It appears be a positive crossed and straight leg raise examination, or at least worsening of his back pain with any leg movement. Neurologic:  Normal speech and language. No gross focal neurologic deficits are appreciated. Speech is normal. No gait instability. Skin:  Skin is warm, dry and intact. No rash noted. Psychiatric: Mood  and affect are normal. Speech and behavior are normal. Patient exhibits appropriate insight and judgment. ____________________________________________  ED COURSE:  Pertinent labs & imaging results that were available during my care of the patient were reviewed by me and considered in my medical decision making (see chart for details). I've advised the patient we cannot manage chronic back pain. Patient is requesting  something to relieve his back spasms. He's been given Toradol, Decadron and Valium IV. ___________________________________________  FINAL ASSESSMENT AND PLAN  Chronic low back pain, muscle spasms  Plan: Patient with labs and imaging as dictated above. Patient states he has gotten some relief with IV Toradol, Decadron and Valium. She was given an extra dose of Valium here. He'll be discharged with Valium to take as an outpatient. Currently vital signs are stable.   Earleen Newport, MD   Earleen Newport, MD 11/12/14 [phone]

## 2014-11-12 NOTE — Discharge Instructions (Signed)
Back Exercises Back exercises help treat and prevent back injuries. The goal of back exercises is to increase the strength of your abdominal and back muscles and the flexibility of your back. These exercises should be started when you no longer have back pain. Back exercises include:  Pelvic Tilt. Lie on your back with your knees bent. Tilt your pelvis until the lower part of your back is against the floor. Hold this position 5 to 10 sec and repeat 5 to 10 times.  Knee to Chest. Pull first 1 knee up against your chest and hold for 20 to 30 seconds, repeat this with the other knee, and then both knees. This may be done with the other leg straight or bent, whichever feels better.  Sit-Ups or Curl-Ups. Bend your knees 90 degrees. Start with tilting your pelvis, and do a partial, slow sit-up, lifting your trunk only 30 to 45 degrees off the floor. Take at least 2 to 3 seconds for each sit-up. Do not do sit-ups with your knees out straight. If partial sit-ups are difficult, simply do the above but with only tightening your abdominal muscles and holding it as directed.  Hip-Lift. Lie on your back with your knees flexed 90 degrees. Push down with your feet and shoulders as you raise your hips a couple inches off the floor; hold for 10 seconds, repeat 5 to 10 times.  Back arches. Lie on your stomach, propping yourself up on bent elbows. Slowly press on your hands, causing an arch in your low back. Repeat 3 to 5 times. Any initial stiffness and discomfort should lessen with repetition over time.  Shoulder-Lifts. Lie face down with arms beside your body. Keep hips and torso pressed to floor as you slowly lift your head and shoulders off the floor. Do not overdo your exercises, especially in the beginning. Exercises may cause you some mild back discomfort which lasts for a few minutes; however, if the pain is more severe, or lasts for more than 15 minutes, do not continue exercises until you see your caregiver.  Improvement with exercise therapy for back problems is slow.  See your caregivers for assistance with developing a proper back exercise program. Document Released: 04/01/2004 Document Revised: 05/17/2011 Document Reviewed: 12/24/2010 [facility] Patient Information 2015 [place], [place]. This information is not intended to replace advice given to you by your health care provider. Make sure you discuss any questions you have with your health care provider.  Chronic Pain Chronic pain can be defined as pain that is off and on and lasts for 3-6 months or longer. Many things cause chronic pain, which can make it difficult to make a diagnosis. There are many treatment options available for chronic pain. However, finding a treatment that works well for you may require trying various approaches until the right one is found. Many people benefit from a combination of two or more types of treatment to control their pain. SYMPTOMS  Chronic pain can occur anywhere in the body and can range from mild to very severe. Some types of chronic pain include:  Headache.  Low back pain.  Cancer pain.  Arthritis pain.  Neurogenic pain. This is pain resulting from damage to nerves. People with chronic pain may also have other symptoms such as:  Depression.  Anger.  Insomnia.  Anxiety. DIAGNOSIS  Your health care provider will help diagnose your condition over time. In many cases, the initial focus will be on excluding possible conditions that could be causing the pain. Depending  on your symptoms, your health care provider may order tests to diagnose your condition. Some of these tests may include:   Blood tests.   CT scan.   MRI.   X-rays.   Ultrasounds.   Nerve conduction studies.  You may need to see a specialist.  TREATMENT  Finding treatment that works well may take time. You may be referred to a pain specialist. He or she may prescribe medicine or therapies, such as:   Mindful meditation  or yoga.  Shots (injections) of numbing or pain-relieving medicines into the spine or area of pain.  Local electrical stimulation.  Acupuncture.   Massage therapy.   Aroma, color, light, or sound therapy.   Biofeedback.   Working with a physical therapist to keep from getting stiff.   Regular, gentle exercise.   Cognitive or behavioral therapy.   Group support.  Sometimes, surgery may be recommended.  HOME CARE INSTRUCTIONS   Take all medicines as directed by your health care provider.   Lessen stress in your life by relaxing and doing things such as listening to calming music.   Exercise or be active as directed by your health care provider.   Eat a healthy diet and include things such as vegetables, fruits, fish, and lean meats in your diet.   Keep all follow-up appointments with your health care provider.   Attend a support group with others suffering from chronic pain. SEEK MEDICAL CARE IF:   Your pain gets worse.   You develop a new pain that was not there before.   You cannot tolerate medicines given to you by your health care provider.   You have new symptoms since your last visit with your health care provider.  SEEK IMMEDIATE MEDICAL CARE IF:   You feel weak.   You have decreased sensation or numbness.   You lose control of bowel or bladder function.   Your pain suddenly gets much worse.   You develop shaking.  You develop chills.  You develop confusion.  You develop chest pain.  You develop shortness of breath.  MAKE SURE YOU:  Understand these instructions.  Will watch your condition.  Will get help right away if you are not doing well or get worse. Document Released: 11/14/2001 Document Revised: 10/25/2012 Document Reviewed: 08/18/2012 [facility] Patient Information 2015 [place], [place]. This information is not intended to replace advice given to you by your health care provider. Make sure you discuss any questions  you have with your health care provider.  Radicular Pain Radicular pain in either the arm or leg is usually from a bulging or herniated disk in the spine. A piece of the herniated disk may press against the nerves as the nerves exit the spine. This causes pain which is felt at the tips of the nerves down the arm or leg. Other causes of radicular pain may include:  Fractures.  Heart disease.  Cancer.  An abnormal and usually degenerative state of the nervous system or nerves (neuropathy). Diagnosis may require CT or MRI scanning to determine the primary cause.  Nerves that start at the neck (nerve roots) may cause radicular pain in the outer shoulder and arm. It can spread down to the thumb and fingers. The symptoms vary depending on which nerve root has been affected. In most cases radicular pain improves with conservative treatment. Neck problems may require physical therapy, a neck collar, or cervical traction. Treatment may take many weeks, and surgery may be considered if the symptoms do  not improve.  Conservative treatment is also recommended for sciatica. Sciatica causes pain to radiate from the lower back or buttock area down the leg into the foot. Often there is a history of back problems. Most patients with sciatica are better after 2 to 4 weeks of rest and other supportive care. Short term bed rest can reduce the disk pressure considerably. Sitting, however, is not a good position since this increases the pressure on the disk. You should avoid bending, lifting, and all other activities which make the problem worse. Traction can be used in severe cases. Surgery is usually reserved for patients who do not improve within the first months of treatment. Only take over-the-counter or prescription medicines for pain, discomfort, or fever as directed by your caregiver. Narcotics and muscle relaxants may help by relieving more severe pain and spasm and by providing mild sedation. Cold or massage can  give significant relief. Spinal manipulation is not recommended. It can increase the degree of disc protrusion. Epidural steroid injections are often effective treatment for radicular pain. These injections deliver medicine to the spinal nerve in the space between the protective covering of the spinal cord and back bones (vertebrae). Your caregiver can give you more information about steroid injections. These injections are most effective when given within two weeks of the onset of pain.  You should see your caregiver for follow up care as recommended. A program for neck and back injury rehabilitation with stretching and strengthening exercises is an important part of management.  SEEK IMMEDIATE MEDICAL CARE IF:  You develop increased pain, weakness, or numbness in your arm or leg.  You develop difficulty with bladder or bowel control.  You develop abdominal pain. Document Released: 04/01/2004 Document Revised: 05/17/2011 Document Reviewed: 06/17/2008 [facility] Patient Information 2015 [place], [place]. This information is not intended to replace advice given to you by your health care provider. Make sure you discuss any questions you have with your health care provider.

## 2014-11-12 NOTE — ED Notes (Signed)
Reports lower back pain x 1 wk.  Worse with movement.

## 2015-04-17 IMAGING — CT CT OF THE LEFT KNEE WITHOUT CONTRAST
3 of 8 series · 11 of 33 positions shown, 12 images · non-contrast
Comparison: None.

CLINICAL DATA: Preoperative planning study. Patient for left knee
replacement.

EXAM:
CT OF THE LEFT KNEE WITHOUT CONTRAST
TECHNIQUE: Multidetector CT imaging of the left knee was performed according to
the standard protocol. Multiplanar CT image reconstructions were
also generated.

[Series 4: knee · axial · 0.39mm/px · z∈[+422,+568]mm · 3 of 292 slices shown, 4 images]
[im 73/292  soft-tissue]
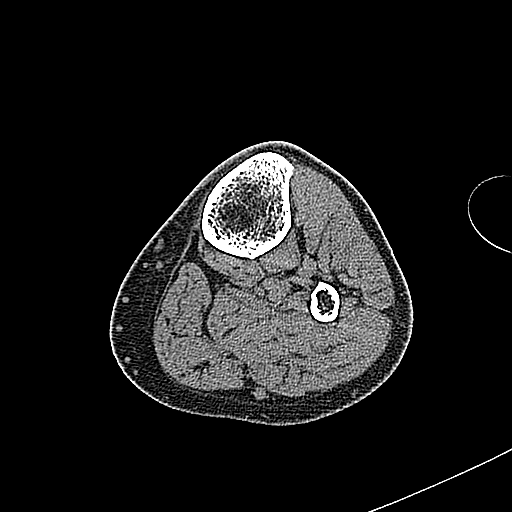
[im 73/292  bone]
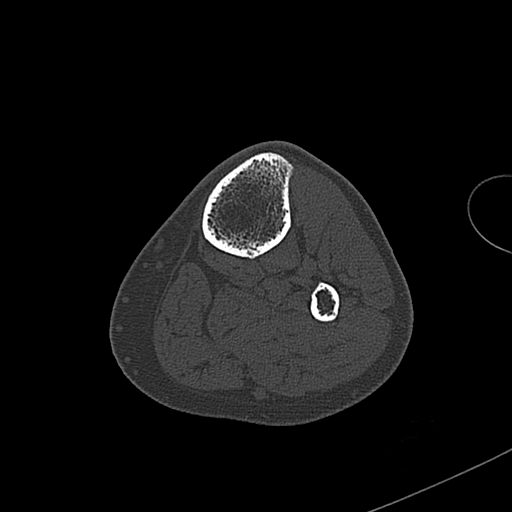
[im 146/292  bone]
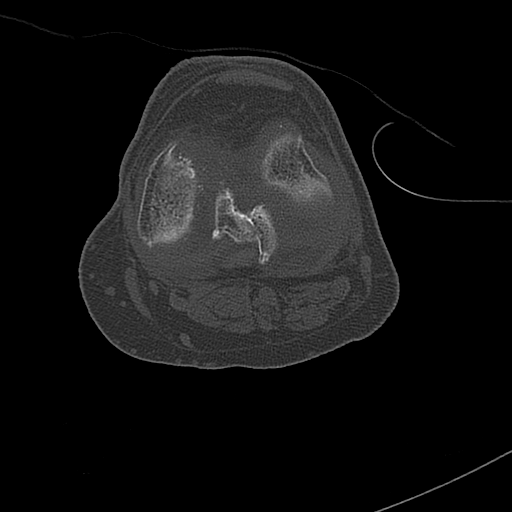
[im 219/292  bone]
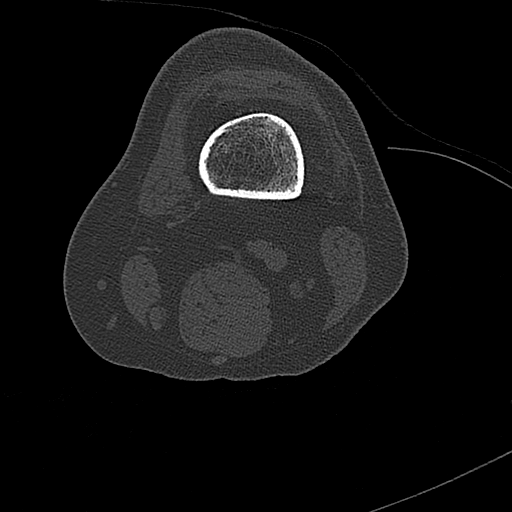

[Series 602: sagittal bone · sagittal · 0.57mm/px · 5 of 56 slices shown]
[im 10/56  bone]
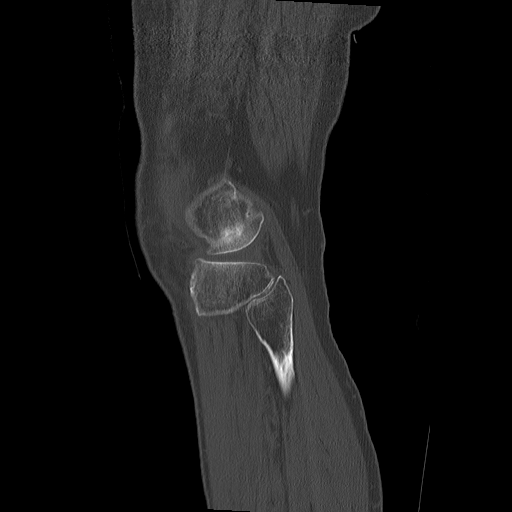
[im 19/56  bone]
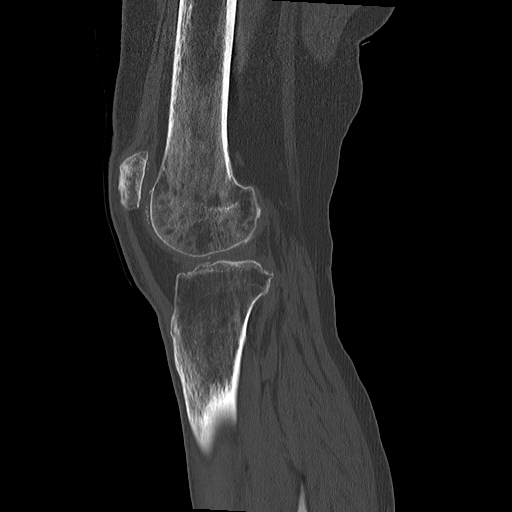
[im 28/56  bone]
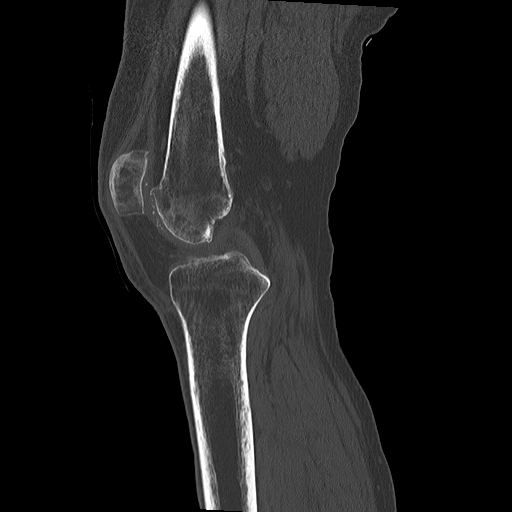
[im 37/56  bone]
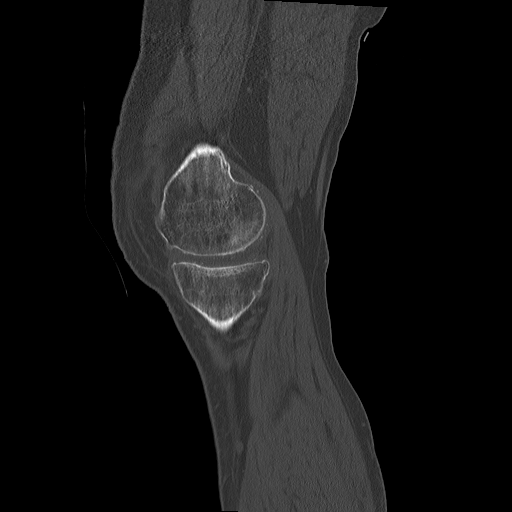
[im 46/56  bone]
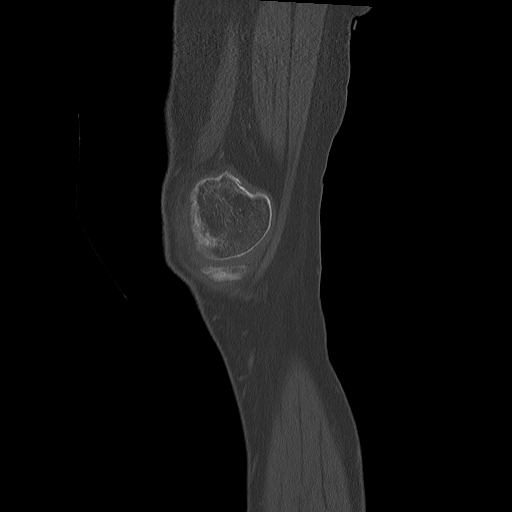

[Series 606: coronal soft tissue · coronal · 0.57mm/px · 3 of 61 slices shown]
[im 13/61  bone]
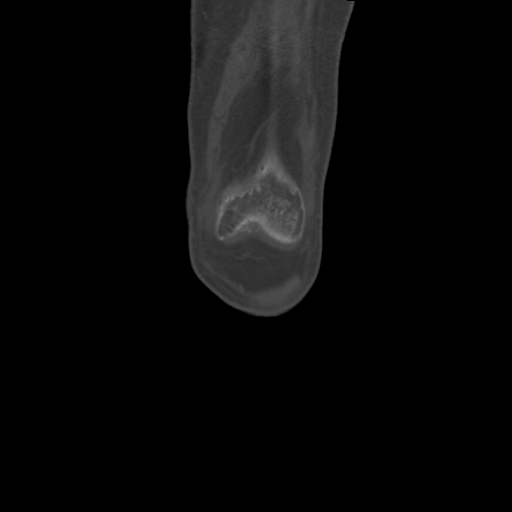
[im 25/61  bone]
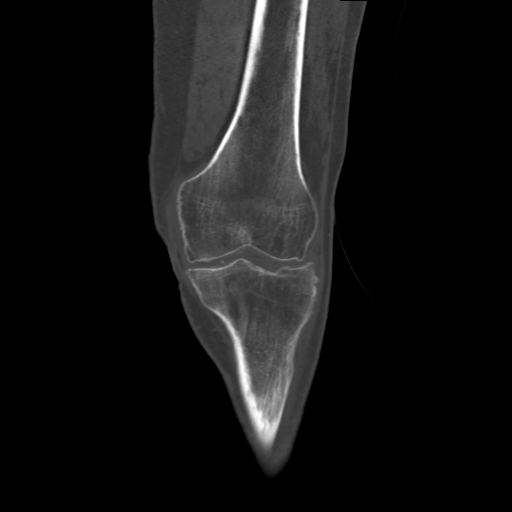
[im 37/61  bone]
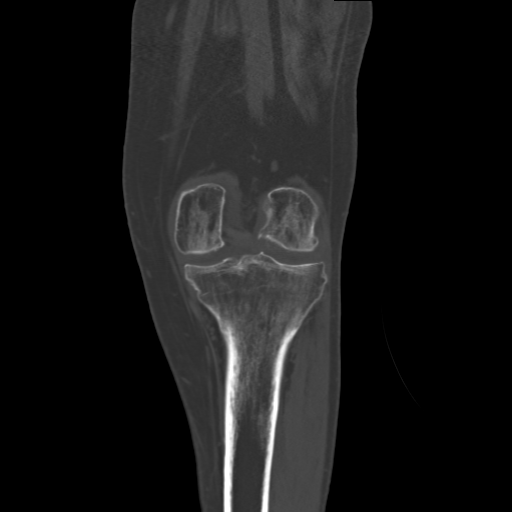

[11 of 33 positions shown; findings below may reference images not displayed]

FINDINGS: Limited imaging of the left hip and ankle is unremarkable.
Degenerative change is seen about the left knee. Chondrocalcinosis
is also noted. No fracture is identified. A trace amount of joint
fluid is present.
IMPRESSION: No acute finding.

Chondrocalcinosis present degenerative disease in chondrocalcinosis
left knee.

## 2015-05-22 ENCOUNTER — Emergency Department: Payer: Medicaid Other

## 2015-05-22 ENCOUNTER — Emergency Department
Admission: EM | Admit: 2015-05-22 | Discharge: 2015-05-23 | Disposition: A | Discharge status: Other Acute Inpatient Hospital | Payer: Medicaid Other | Attending: Emergency Medicine | Admitting: Emergency Medicine

## 2015-05-22 ENCOUNTER — Encounter: Payer: Self-pay | Admitting: Emergency Medicine

## 2015-05-22 DIAGNOSIS — Z9861 Coronary angioplasty status: Secondary | ICD-10-CM | POA: Diagnosis not present

## 2015-05-22 DIAGNOSIS — R079 Chest pain, unspecified: Secondary | ICD-10-CM | POA: Diagnosis present

## 2015-05-22 DIAGNOSIS — Z7902 Long term (current) use of antithrombotics/antiplatelets: Secondary | ICD-10-CM | POA: Diagnosis not present

## 2015-05-22 DIAGNOSIS — F329 Major depressive disorder, single episode, unspecified: Secondary | ICD-10-CM | POA: Diagnosis not present

## 2015-05-22 DIAGNOSIS — Z79899 Other long term (current) drug therapy: Secondary | ICD-10-CM | POA: Insufficient documentation

## 2015-05-22 DIAGNOSIS — I209 Angina pectoris, unspecified: Secondary | ICD-10-CM | POA: Diagnosis not present

## 2015-05-22 DIAGNOSIS — Z7982 Long term (current) use of aspirin: Secondary | ICD-10-CM | POA: Insufficient documentation

## 2015-05-22 DIAGNOSIS — I252 Old myocardial infarction: Secondary | ICD-10-CM | POA: Diagnosis not present

## 2015-05-22 DIAGNOSIS — F17200 Nicotine dependence, unspecified, uncomplicated: Secondary | ICD-10-CM | POA: Insufficient documentation

## 2015-05-22 HISTORY — DX: Disorder of thyroid, unspecified: E07.9

## 2015-05-22 HISTORY — DX: Chronic obstructive pulmonary disease, unspecified: J44.9

## 2015-05-22 LAB — CBC
HEMATOCRIT: 48 % (ref 40.0–52.0)
Hemoglobin: 16.3 g/dL (ref 13.0–18.0)
MCH: 30.8 pg (ref 26.0–34.0)
MCHC: 33.9 g/dL (ref 32.0–36.0)
MCV: 91.1 fL (ref 80.0–100.0)
Platelets: 248 10*3/uL (ref 150–440)
RBC: 5.27 MIL/uL (ref 4.40–5.90)
RDW: 14 % (ref 11.5–14.5)
WBC: 9.1 10*3/uL (ref 3.8–10.6)

## 2015-05-22 LAB — BASIC METABOLIC PANEL
Anion gap: 8 (ref 5–15)
BUN: 14 mg/dL (ref 6–20)
CALCIUM: 9.3 mg/dL (ref 8.9–10.3)
CO2: 20 mmol/L — ABNORMAL LOW (ref 22–32)
Chloride: 106 mmol/L (ref 101–111)
Creatinine, Ser: 0.86 mg/dL (ref 0.61–1.24)
GFR calc Af Amer: >60 mL/min (ref >60)
GFR calc non Af Amer: >60 mL/min (ref >60)
Glucose, Bld: 90 mg/dL (ref 65–99)
Potassium: 4.2 mmol/L (ref 3.5–5.1)
Sodium: 134 mmol/L — ABNORMAL LOW (ref 135–145)

## 2015-05-22 LAB — APTT: APTT: 33 s (ref 24–36)

## 2015-05-22 LAB — PROTIME-INR
INR: 1.08
PROTHROMBIN TIME: 14.2 s (ref 11.4–15.0)

## 2015-05-22 LAB — TROPONIN I

## 2015-05-22 MED ORDER — HEPARIN (PORCINE) IN NACL 100-0.45 UNIT/ML-% IJ SOLN
1050.0000 [IU]/h | INTRAMUSCULAR | Status: DC
  Administered 2015-05-22: 1050 [IU]/h via INTRAVENOUS
  Filled 2015-05-22: qty 250

## 2015-05-22 MED ORDER — GABAPENTIN 300 MG PO CAPS
300.0000 mg | ORAL_CAPSULE | ORAL | Status: AC
  Administered 2015-05-22: 300 mg via ORAL

## 2015-05-22 MED ORDER — ASPIRIN 81 MG PO CHEW
243.0000 mg | CHEWABLE_TABLET | Freq: Once | ORAL | Status: AC
  Administered 2015-05-22: 243 mg via ORAL
  Filled 2015-05-22: qty 3

## 2015-05-22 MED ORDER — HEPARIN BOLUS VIA INFUSION
4000.0000 [IU] | Freq: Once | INTRAVENOUS | Status: AC
  Administered 2015-05-22: 4000 [IU] via INTRAVENOUS
  Filled 2015-05-22: qty 4000

## 2015-05-22 MED ORDER — HEPARIN SODIUM (PORCINE) 5000 UNIT/ML IJ SOLN
INTRAMUSCULAR | Status: AC
  Filled 2015-05-22: qty 1

## 2015-05-22 MED ORDER — CYCLOBENZAPRINE HCL 10 MG PO TABS
10.0000 mg | ORAL_TABLET | ORAL | Status: AC
  Administered 2015-05-22: 10 mg via ORAL

## 2015-05-22 MED ORDER — CYCLOBENZAPRINE HCL 10 MG PO TABS
ORAL_TABLET | ORAL | Status: AC
  Administered 2015-05-22: 10 mg via ORAL
  Filled 2015-05-22: qty 1

## 2015-05-22 MED ORDER — GABAPENTIN 300 MG PO CAPS
ORAL_CAPSULE | ORAL | Status: AC
  Administered 2015-05-22: 300 mg via ORAL
  Filled 2015-05-22: qty 1

## 2015-05-22 NOTE — ED Notes (Signed)
Pt denies pain, Pt finished meal tray, pt denies any needs at this time.

## 2015-05-22 NOTE — ED Provider Notes (Signed)
[facility] Emergency Department Provider Note   ____________________________________________  Time seen:  I have reviewed the triage vital signs and the triage nursing note.  HISTORY  Chief Complaint Chest Pain and Weakness   Historian Patient, niece and nephew  HPI Bryce Haney is a 65 y.o. male with a history of prior MI, prior to stents, and history of angina due to known severe 2 vessel disease, not amenable to stenting, and previously recommended for bypass in May 2016, is coming in with chest pain that started this migraine around 8 AM and is somewhat better but still there intermittently. Located left chest going into the left arm. He took multiple sublingual nitros with some relief.  Patient states one year ago he had a cardiac catheter with Dr. Nehemiah Massed and was told he should have bypass surgery for the blockages that were not amenable to stenting, but the patient states that he became angry and stormed out of the office. He has not seen a cardiologist since then.  He additionally reports that he is afraid of bypass because his father had a bypass and died just 4 months later.  He states he was told to not do anything exertional, and nondistended heart rate up, so he's been pretty depressed sitting in his bed and not doing anything in order to protect his heart.    Past Medical History  Diagnosis Date  . Myocardial infarction ([place])   . Back pain   . Thyroid disease   . COPD (chronic obstructive pulmonary disease) ([facility])     There are no active problems to display for this patient.   Past Surgical History  Procedure Laterality Date  . Coronary angioplasty    . Cardiac catheterization N/A 07/24/2014    Procedure: Left Heart Cath;  Surgeon: Corey Skains, MD;  Location: [facility];  Service: Cardiovascular;  Laterality: N/A;  . Coronary angioplasty with stent placement      Current Outpatient Rx  Name  Route  Sig  Dispense   Refill  . aspirin 81 MG tablet   Oral   Take 81 mg by mouth daily.         Marland Kitchen atorvastatin (LIPITOR) 80 MG tablet   Oral   Take 80 mg by mouth daily at 6 PM.          . carvedilol (COREG) 6.25 MG tablet   Oral   Take 3.125 mg by mouth 2 (two) times daily with a meal.          . clopidogrel (PLAVIX) 75 MG tablet   Oral   Take 75 mg by mouth daily.         . cyclobenzaprine (FLEXERIL) 10 MG tablet   Oral   Take 10 mg by mouth at bedtime as needed for muscle spasms.         Marland Kitchen gabapentin (NEURONTIN) 300 MG capsule   Oral   Take 300 mg by mouth at bedtime.          Marland Kitchen levothyroxine (SYNTHROID, LEVOTHROID) 150 MCG tablet   Oral   Take 150 mcg by mouth daily before breakfast.         . mirtazapine (REMERON) 30 MG tablet   Oral   Take 30 mg by mouth at bedtime.         . nitroGLYCERIN (NITROSTAT) 0.4 MG SL tablet   Sublingual   Place 0.4 mg under the tongue every 5 (five) minutes as needed for chest pain.         Marland Kitchen  ranitidine (ZANTAC) 150 MG tablet   Oral   Take 150 mg by mouth 2 (two) times daily.           Allergies Morphine and related  History reviewed. No pertinent family history.  Social History Social History  Substance Use Topics  . Smoking status: Current Every Day Smoker -- 1.00 packs/day for 20 years  . Smokeless tobacco: None  . Alcohol Use: No    Review of Systems  Constitutional: Negative for fever. Eyes: Negative for visual changes. ENT: Negative for sore throat. Cardiovascular: Negative for palpitations Respiratory: Negative for shortness of breath. Gastrointestinal: Negative for abdominal pain, vomiting and diarrhea. Genitourinary: Negative for dysuria. Musculoskeletal: Negative for back pain. Skin: Negative for rash. Neurological: Negative for headache. 10 point Review of Systems otherwise negative ____________________________________________   PHYSICAL EXAM:  VITAL SIGNS: ED Triage Vitals  Enc Vitals Group      BP 05/22/15 1436 120/92 mmHg     Pulse Rate 05/22/15 1436 55     Resp 05/22/15 1436 18     Temp 05/22/15 1436 98 F (36.7 C)     Temp Source 05/22/15 1436 Oral     SpO2 05/22/15 1436 99 %     Weight 05/22/15 1436 185 lb (83.915 kg)     Height 05/22/15 1436 6' (1.829 m)     Head Cir --      Peak Flow --      Pain Score 05/22/15 1437 3     Pain Loc --      Pain Edu? --      Excl. in [place]? --      Constitutional: Alert and oriented. Well appearing and in no distress. HEENT   Head: Normocephalic and atraumatic.      Eyes: Conjunctivae are normal. PERRL. Normal extraocular movements.      Ears:         Nose: No congestion/rhinnorhea.   Mouth/Throat: Mucous membranes are moist.   Neck: No stridor. Cardiovascular/Chest: Normal rate, regular rhythm.  No murmurs, rubs, or gallops. Respiratory: Normal respiratory effort without tachypnea nor retractions. Breath sounds are clear and equal bilaterally. No wheezes/rales/rhonchi. Gastrointestinal: Soft. No distention, no guarding, no rebound. Nontender.    Genitourinary/rectal:Deferred Musculoskeletal: Nontender with normal range of motion in all extremities. No joint effusions.  No lower extremity tenderness.  No edema. Neurologic:  Normal speech and language. No gross or focal neurologic deficits are appreciated. Skin:  Skin is warm, dry and intact. No rash noted. Psychiatric: Mood and affect are normal. Speech and behavior are normal. Patient exhibits appropriate insight and judgment.  ____________________________________________   EKG I, Lisa Roca, MD, the attending physician have personally viewed and interpreted all ECGs.  59 bpm. Normal sinus rhythm. Narrow QRS. Nonspecific ST segment ____________________________________________  LABS (pertinent positives/negatives)  Basic metabolic panel significant for sodium 134 otherwise without significant abnormality White blood count 9.1, HemoCue was 16.3 platelet count 249  sign troponin less than 0.03  ____________________________________________  RADIOLOGY All Xrays were viewed by me. Imaging interpreted by Radiologist.  Chest: No active cardio pulmonary disease __________________________________________  PROCEDURES  Procedure(s) performed: None  Critical Care performed: None  ____________________________________________   ED COURSE / ASSESSMENT AND PLAN  Pertinent labs & imaging results that were available during my care of the patient were reviewed by me and considered in my medical decision making (see chart for details).   Patient arrived with chest pain and his EKG is nonspecific. Troponin negative, and onset of pain was  at 8 AM, no evidence of acute MI at this point. However when I review his history, he does have severe 2 vessel coronary artery disease and has been recommended for bypass 1 year ago.  I did discuss with him that it would still be recommended that he has recatheterization and evaluation for bypass. I discussed with Dr. Rockey Situ, who agrees likely transfer to [facility] where patient was being evaluated previously for bypass would be the best option.  Patient was given 3 baby aspirins as he already had one today. He was also given heparin bolus and drip.   After acceptance in transfer, plan will be to call back the transfer center to ensure patient is transferred as soon as possible. If there is currently extended delay including overnight, I would admit here, and plan for transfer when available to [facility].  CONSULTATIONS: Dr.Gollan, cardiology, by phone, recommends transfer.  [facility], Dr. Wille Glaser, CCU - accepted for transfer.  Awaiting discharge bed availability -- 5:50pm, reported by bed control would expect this to be in the next few hours.   Patient / Family / Caregiver informed of clinical course, medical decision-making process, and agree with plan.    ___________________________________________   FINAL  CLINICAL IMPRESSION(S) / ED DIAGNOSES   Final diagnoses:  Ischemic chest pain [facility])              Note: This dictation was prepared with Dragon dictation. Any transcriptional errors that result from this process are unintentional   Lisa Roca, MD 05/22/15 1751

## 2015-05-22 NOTE — ED Notes (Signed)
Pt presents to the ED today via [facility] for c/o chest pressure that began at 0830 this morning. Per EMS pt had 2 doses of SL nitro at 0930 and 1000. EMS states they gave 324 ASA en route as well as 3 sprays of SL nitro. Pt states that he was told by his cardiologist Dr. Nehemiah Massed that he had another heart block and there was nothing to be done.

## 2015-05-22 NOTE — Consult Note (Signed)
ANTICOAGULATION CONSULT NOTE - Initial Consult  Pharmacy Consult for heparin drip Indication: chest pain/ACS  Allergies  Allergen Reactions  . Morphine And Related Hives    Patient Measurements: Height: 6' (182.9 cm) Weight: 185 lb (83.915 kg) IBW/kg (Calculated) : 77.6 Heparin Dosing Weight: 83.9kg  Vital Signs: Temp: 98 F (36.7 C) (03/16 1436) Temp Source: Oral (03/16 1436) BP: 173/101 mmHg (03/16 1630) Pulse Rate: 55 (03/16 1630)  Labs:  Recent Labs  05/22/15 1430  HGB 16.3  HCT 48.0  PLT 248  CREATININE 0.86  TROPONINI <0.03    Estimated Creatinine Clearance: 97.8 mL/min (by C-G formula based on Cr of 0.86).   Medical History: Past Medical History  Diagnosis Date  . Myocardial infarction ([place])   . Back pain   . Thyroid disease   . COPD (chronic obstructive pulmonary disease) (HCC)     Medications:  Scheduled:  . heparin  4,000 Units Intravenous Once    Assessment: Pt is a 65 year old male who presents with chest pain. Baseline APTT and INR were ordered as add on. No home anticoagulants were seen upon review of outpatient meds.   Goal of Therapy:  Heparin level 0.3-0.7 units/ml Monitor platelets by anticoagulation protocol: Yes   Plan:  Give 4000 units bolus x 1 Start heparin infusion at 1050 units/hr Check anti-Xa level in 6 hours and daily while on heparin Continue to monitor H&H and platelets  Melissa D Maccia 05/22/2015,4:50 PM

## 2015-05-22 NOTE — ED Notes (Signed)
Pt reports crushing CP starting this am at 8 am. Pt reports 2 doses of nitro did not help. Pt now reports an "improvement" in pain but states" My right hand was a little bit numb too". No apparent neuro deficits. Pt VSS.

## 2015-05-23 DIAGNOSIS — K219 Gastro-esophageal reflux disease without esophagitis: Secondary | ICD-10-CM | POA: Insufficient documentation

## 2015-05-23 DIAGNOSIS — F329 Major depressive disorder, single episode, unspecified: Secondary | ICD-10-CM | POA: Insufficient documentation

## 2015-05-23 DIAGNOSIS — F32A Depression, unspecified: Secondary | ICD-10-CM | POA: Insufficient documentation

## 2015-05-23 LAB — HEPARIN LEVEL (UNFRACTIONATED): Heparin Unfractionated: 0.62 IU/mL (ref 0.30–0.70)

## 2015-05-23 NOTE — Consult Note (Signed)
ANTICOAGULATION CONSULT NOTE - Initial Consult  Pharmacy Consult for heparin drip Indication: chest pain/ACS  Allergies  Allergen Reactions  . Morphine And Related Hives   Patient Measurements: Height: 6' (182.9 cm) Weight: 185 lb (83.915 kg) IBW/kg (Calculated) : 77.6 Heparin Dosing Weight: 83.9kg  Vital Signs: Temp: 98 F (36.7 C) (03/16 1436) Temp Source: Oral (03/16 1436) BP: 124/87 mmHg (03/17 0110) Pulse Rate: 55 (03/17 0110)  Labs:  Recent Labs  05/22/15 1430 05/23/15 0019  HGB 16.3  --   HCT 48.0  --   PLT 248  --   APTT 33  --   LABPROT 14.2  --   INR 1.08  --   HEPARINUNFRC  --  0.62  CREATININE 0.86  --   TROPONINI <0.03  --     Estimated Creatinine Clearance: 97.8 mL/min (by C-G formula based on Cr of 0.86).   Medical History: Past Medical History  Diagnosis Date  . Myocardial infarction ([place])   . Back pain   . Thyroid disease   . COPD (chronic obstructive pulmonary disease) (HCC)    Assessment: Pt is a 65 year old male who presents with chest pain. Baseline APTT and INR were ordered as add on. No home anticoagulants were seen upon review of outpatient meds.   Goal of Therapy:  Heparin level 0.3-0.7 units/ml Monitor platelets by anticoagulation protocol: Yes   Plan:  Heparin level drawn at 0019 was within therapeutic range at 0.62. Will continue with current infusion rate of 1050 units/hr. Confimatory heparin level ordered in 6 hours. CBC ordered with AM labs.  Thank you for the consult.  Lenis Noon, PharmD Clinical Pharmacist 05/23/2015,1:29 AM

## 2015-05-29 DIAGNOSIS — Z951 Presence of aortocoronary bypass graft: Secondary | ICD-10-CM | POA: Insufficient documentation

## 2015-06-05 DIAGNOSIS — I8003 Phlebitis and thrombophlebitis of superficial vessels of lower extremities, bilateral: Secondary | ICD-10-CM | POA: Insufficient documentation

## 2015-06-19 ENCOUNTER — Emergency Department: Payer: Medicaid Other

## 2015-06-19 ENCOUNTER — Inpatient Hospital Stay
Admission: EM | Admit: 2015-06-19 | Discharge: 2015-06-21 | DRG: 190 | Disposition: A | Discharge status: Home Health | Payer: Medicaid Other | Attending: Specialist | Admitting: Specialist

## 2015-06-19 DIAGNOSIS — J189 Pneumonia, unspecified organism: Secondary | ICD-10-CM | POA: Diagnosis present

## 2015-06-19 DIAGNOSIS — Z7901 Long term (current) use of anticoagulants: Secondary | ICD-10-CM

## 2015-06-19 DIAGNOSIS — Z885 Allergy status to narcotic agent status: Secondary | ICD-10-CM | POA: Diagnosis not present

## 2015-06-19 DIAGNOSIS — Z951 Presence of aortocoronary bypass graft: Secondary | ICD-10-CM

## 2015-06-19 DIAGNOSIS — E785 Hyperlipidemia, unspecified: Secondary | ICD-10-CM | POA: Diagnosis present

## 2015-06-19 DIAGNOSIS — I252 Old myocardial infarction: Secondary | ICD-10-CM

## 2015-06-19 DIAGNOSIS — Z7982 Long term (current) use of aspirin: Secondary | ICD-10-CM

## 2015-06-19 DIAGNOSIS — Z79899 Other long term (current) drug therapy: Secondary | ICD-10-CM | POA: Diagnosis not present

## 2015-06-19 DIAGNOSIS — I482 Chronic atrial fibrillation: Secondary | ICD-10-CM | POA: Diagnosis present

## 2015-06-19 DIAGNOSIS — F17210 Nicotine dependence, cigarettes, uncomplicated: Secondary | ICD-10-CM | POA: Diagnosis present

## 2015-06-19 DIAGNOSIS — E039 Hypothyroidism, unspecified: Secondary | ICD-10-CM | POA: Diagnosis present

## 2015-06-19 DIAGNOSIS — Z9861 Coronary angioplasty status: Secondary | ICD-10-CM | POA: Diagnosis not present

## 2015-06-19 DIAGNOSIS — Z8249 Family history of ischemic heart disease and other diseases of the circulatory system: Secondary | ICD-10-CM | POA: Diagnosis not present

## 2015-06-19 DIAGNOSIS — I119 Hypertensive heart disease without heart failure: Secondary | ICD-10-CM | POA: Diagnosis present

## 2015-06-19 DIAGNOSIS — I2510 Atherosclerotic heart disease of native coronary artery without angina pectoris: Secondary | ICD-10-CM | POA: Diagnosis present

## 2015-06-19 DIAGNOSIS — J440 Chronic obstructive pulmonary disease with acute lower respiratory infection: Secondary | ICD-10-CM | POA: Diagnosis present

## 2015-06-19 HISTORY — DX: Presence of aortocoronary bypass graft: Z95.1

## 2015-06-19 HISTORY — DX: Hyperlipidemia, unspecified: E78.5

## 2015-06-19 HISTORY — DX: Unspecified atrial fibrillation: I48.91

## 2015-06-19 LAB — PROTIME-INR
INR: 1.35
Prothrombin Time: 16.8 seconds — ABNORMAL HIGH (ref 11.4–15.0)

## 2015-06-19 LAB — LACTIC ACID, PLASMA
Lactic Acid, Venous: 1 mmol/L (ref 0.5–2.0)
Lactic Acid, Venous: 0.9 mmol/L (ref 0.5–2.0)

## 2015-06-19 LAB — BASIC METABOLIC PANEL
ANION GAP: 11 (ref 5–15)
BUN: 14 mg/dL (ref 6–20)
CHLORIDE: 100 mmol/L — AB (ref 101–111)
CO2: 19 mmol/L — AB (ref 22–32)
Calcium: 8.4 mg/dL — ABNORMAL LOW (ref 8.9–10.3)
Creatinine, Ser: 1.06 mg/dL (ref 0.61–1.24)
GFR calc Af Amer: >60 mL/min (ref >60)
GLUCOSE: 87 mg/dL (ref 65–99)
POTASSIUM: 3.9 mmol/L (ref 3.5–5.1)
Sodium: 130 mmol/L — ABNORMAL LOW (ref 135–145)

## 2015-06-19 LAB — CBC WITH DIFFERENTIAL/PLATELET
Basophils Absolute: 0 10*3/uL (ref 0–0.1)
Basophils Relative: 0 %
EOS PCT: 2 %
Eosinophils Absolute: 0.3 10*3/uL (ref 0–0.7)
HEMATOCRIT: 36.2 % — AB (ref 40.0–52.0)
HEMOGLOBIN: 12 g/dL — AB (ref 13.0–18.0)
LYMPHS ABS: 0.8 10*3/uL — AB (ref 1.0–3.6)
LYMPHS PCT: 6 %
MCH: 29.9 pg (ref 26.0–34.0)
MCHC: 33.2 g/dL (ref 32.0–36.0)
MCV: 89.9 fL (ref 80.0–100.0)
Monocytes Absolute: 1 10*3/uL (ref 0.2–1.0)
Monocytes Relative: 8 %
NEUTROS ABS: 10.9 10*3/uL — AB (ref 1.4–6.5)
Neutrophils Relative %: 84 %
PLATELETS: 294 10*3/uL (ref 150–440)
RBC: 4.02 MIL/uL — AB (ref 4.40–5.90)
RDW: 14.5 % (ref 11.5–14.5)
WBC: 13.1 10*3/uL — AB (ref 3.8–10.6)

## 2015-06-19 LAB — TROPONIN I: TROPONIN I: 0.03 ng/mL (ref <0.031)

## 2015-06-19 MED ORDER — VANCOMYCIN HCL IN DEXTROSE 1-5 GM/200ML-% IV SOLN
1000.0000 mg | Freq: Two times a day (BID) | INTRAVENOUS | Status: DC
  Administered 2015-06-20 – 2015-06-21 (×3): 1000 mg via INTRAVENOUS
  Filled 2015-06-19 (×5): qty 200

## 2015-06-19 MED ORDER — LEVOTHYROXINE SODIUM 150 MCG PO TABS
150.0000 ug | ORAL_TABLET | Freq: Every day | ORAL | Status: DC
  Administered 2015-06-20 – 2015-06-21 (×2): 150 ug via ORAL
  Filled 2015-06-19 (×2): qty 1

## 2015-06-19 MED ORDER — PIPERACILLIN-TAZOBACTAM 3.375 G IVPB 30 MIN
3.3750 g | Freq: Once | INTRAVENOUS | Status: AC
  Administered 2015-06-19: 3.375 g via INTRAVENOUS
  Filled 2015-06-19: qty 50

## 2015-06-19 MED ORDER — METOPROLOL TARTRATE 25 MG PO TABS
12.5000 mg | ORAL_TABLET | Freq: Two times a day (BID) | ORAL | Status: DC
  Administered 2015-06-19: 12.5 mg via ORAL
  Filled 2015-06-19: qty 1

## 2015-06-19 MED ORDER — ACETAMINOPHEN 650 MG RE SUPP: 650.0000 mg | Freq: Four times a day (QID) | RECTAL | Status: DC | PRN

## 2015-06-19 MED ORDER — ACETAMINOPHEN 325 MG PO TABS: 650.0000 mg | ORAL_TABLET | Freq: Four times a day (QID) | ORAL | Status: DC | PRN

## 2015-06-19 MED ORDER — WARFARIN - PHYSICIAN DOSING INPATIENT: Freq: Every day | Status: DC

## 2015-06-19 MED ORDER — LEVOFLOXACIN IN D5W 750 MG/150ML IV SOLN
750.0000 mg | Freq: Once | INTRAVENOUS | Status: AC
  Administered 2015-06-19: 750 mg via INTRAVENOUS
  Filled 2015-06-19: qty 150

## 2015-06-19 MED ORDER — GABAPENTIN 300 MG PO CAPS
300.0000 mg | ORAL_CAPSULE | Freq: Every day | ORAL | Status: DC
  Administered 2015-06-19 – 2015-06-20 (×2): 300 mg via ORAL
  Filled 2015-06-19 (×2): qty 1

## 2015-06-19 MED ORDER — FUROSEMIDE 40 MG PO TABS
20.0000 mg | ORAL_TABLET | Freq: Two times a day (BID) | ORAL | Status: DC
  Administered 2015-06-20 – 2015-06-21 (×3): 20 mg via ORAL
  Filled 2015-06-19 (×3): qty 1

## 2015-06-19 MED ORDER — FAMOTIDINE 20 MG PO TABS
20.0000 mg | ORAL_TABLET | Freq: Two times a day (BID) | ORAL | Status: DC
  Administered 2015-06-19 – 2015-06-21 (×4): 20 mg via ORAL
  Filled 2015-06-19 (×4): qty 1

## 2015-06-19 MED ORDER — MIRTAZAPINE 15 MG PO TABS
30.0000 mg | ORAL_TABLET | Freq: Every day | ORAL | Status: DC
  Administered 2015-06-19 – 2015-06-20 (×2): 30 mg via ORAL
  Filled 2015-06-19 (×2): qty 2

## 2015-06-19 MED ORDER — AMIODARONE HCL 200 MG PO TABS
200.0000 mg | ORAL_TABLET | Freq: Every day | ORAL | Status: DC
  Administered 2015-06-19 – 2015-06-21 (×3): 200 mg via ORAL
  Filled 2015-06-19 (×3): qty 1

## 2015-06-19 MED ORDER — ONDANSETRON HCL 4 MG PO TABS: 4.0000 mg | ORAL_TABLET | Freq: Four times a day (QID) | ORAL | Status: DC | PRN

## 2015-06-19 MED ORDER — ENOXAPARIN SODIUM 40 MG/0.4ML ~~LOC~~ SOLN
40.0000 mg | SUBCUTANEOUS | Status: DC
  Administered 2015-06-19 – 2015-06-20 (×2): 40 mg via SUBCUTANEOUS
  Filled 2015-06-19 (×2): qty 0.4

## 2015-06-19 MED ORDER — ASPIRIN EC 81 MG PO TBEC
81.0000 mg | DELAYED_RELEASE_TABLET | Freq: Every day | ORAL | Status: DC
  Administered 2015-06-19 – 2015-06-21 (×3): 81 mg via ORAL
  Filled 2015-06-19 (×3): qty 1

## 2015-06-19 MED ORDER — POTASSIUM CHLORIDE CRYS ER 20 MEQ PO TBCR
20.0000 meq | EXTENDED_RELEASE_TABLET | Freq: Every day | ORAL | Status: DC
  Administered 2015-06-19 – 2015-06-21 (×3): 20 meq via ORAL
  Filled 2015-06-19 (×3): qty 1

## 2015-06-19 MED ORDER — ATORVASTATIN CALCIUM 20 MG PO TABS
40.0000 mg | ORAL_TABLET | Freq: Every day | ORAL | Status: DC
  Administered 2015-06-19 – 2015-06-21 (×3): 40 mg via ORAL
  Filled 2015-06-19 (×3): qty 2

## 2015-06-19 MED ORDER — ONDANSETRON HCL 4 MG/2ML IJ SOLN
4.0000 mg | Freq: Four times a day (QID) | INTRAMUSCULAR | Status: DC | PRN
  Administered 2015-06-19: 4 mg via INTRAVENOUS
  Filled 2015-06-19: qty 2

## 2015-06-19 MED ORDER — GUAIFENESIN-DM 100-10 MG/5ML PO SYRP
5.0000 mL | ORAL_SOLUTION | ORAL | Status: DC | PRN
  Administered 2015-06-19: 5 mL via ORAL
  Filled 2015-06-19: qty 5

## 2015-06-19 MED ORDER — WARFARIN SODIUM 2 MG PO TABS
2.0000 mg | ORAL_TABLET | Freq: Every day | ORAL | Status: DC
  Administered 2015-06-19 – 2015-06-21 (×3): 2 mg via ORAL
  Filled 2015-06-19 (×3): qty 1

## 2015-06-19 MED ORDER — VANCOMYCIN HCL IN DEXTROSE 1-5 GM/200ML-% IV SOLN
1000.0000 mg | Freq: Once | INTRAVENOUS | Status: AC
  Administered 2015-06-19: 1000 mg via INTRAVENOUS
  Filled 2015-06-19: qty 200

## 2015-06-19 MED ORDER — PIPERACILLIN-TAZOBACTAM 3.375 G IVPB
3.3750 g | Freq: Three times a day (TID) | INTRAVENOUS | Status: DC
  Administered 2015-06-20 – 2015-06-21 (×4): 3.375 g via INTRAVENOUS
  Filled 2015-06-19 (×6): qty 50

## 2015-06-19 NOTE — ED Notes (Signed)
Pt is post op double bypass open heart 3/23 at [facility], states for the past 7 days having SOB with cough and congestion , pain in chest with movement.Bryce Haney

## 2015-06-19 NOTE — ED Provider Notes (Signed)
[facility] Emergency Department Provider Note    ____________________________________________  Time seen: ~1615  I have reviewed the triage vital signs and the nursing notes.   HISTORY  Chief Complaint Cough; Nasal Congestion; and Post-op Problem   History limited by: Not Limited   HPI Bryce Haney is a 65 y.o. male status post CABG 3 weeks ago who presents to the emergency department today because of concerns for cough and cold-like symptoms. He states that this is been going on for a little over a week. He states his main concern is for cough. He states that he has then developed pain. He describes it as being located in the bilateral chest. It will become severe. It is worse with his cough. His carpal be brought on by a deep breath. He describes as dark green phlegm with his cough. He denies any blood in his cough. He denies any fevers although he states he has felt intermittent chills. He states he has had a sick contact at home. That family member had similar symptoms of cough and cold-like symptoms.     Past Medical History  Diagnosis Date  . Myocardial infarction ([place])   . Back pain   . Thyroid disease   . COPD (chronic obstructive pulmonary disease) (HCC)     There are no active problems to display for this patient.   Past Surgical History  Procedure Laterality Date  . Coronary angioplasty    . Cardiac catheterization N/A 07/24/2014    Procedure: Left Heart Cath;  Surgeon: Corey Skains, MD;  Location: [facility];  Service: Cardiovascular;  Laterality: N/A;  . Coronary angioplasty with stent placement    . Coronary artery bypass graft      Current Outpatient Rx  Name  Route  Sig  Dispense  Refill  . aspirin 81 MG tablet   Oral   Take 81 mg by mouth daily.         Marland Kitchen atorvastatin (LIPITOR) 80 MG tablet   Oral   Take 80 mg by mouth daily at 6 PM.          . carvedilol (COREG) 6.25 MG tablet   Oral   Take 3.125  mg by mouth 2 (two) times daily with a meal.          . clopidogrel (PLAVIX) 75 MG tablet   Oral   Take 75 mg by mouth daily.         . cyclobenzaprine (FLEXERIL) 10 MG tablet   Oral   Take 10 mg by mouth at bedtime as needed for muscle spasms.         Marland Kitchen gabapentin (NEURONTIN) 300 MG capsule   Oral   Take 300 mg by mouth at bedtime.          Marland Kitchen levothyroxine (SYNTHROID, LEVOTHROID) 150 MCG tablet   Oral   Take 150 mcg by mouth daily before breakfast.         . mirtazapine (REMERON) 30 MG tablet   Oral   Take 30 mg by mouth at bedtime.         . nitroGLYCERIN (NITROSTAT) 0.4 MG SL tablet   Sublingual   Place 0.4 mg under the tongue every 5 (five) minutes as needed for chest pain.         . ranitidine (ZANTAC) 150 MG tablet   Oral   Take 150 mg by mouth 2 (two) times daily.  Allergies Morphine and related  No family history on file.  Social History Social History  Substance Use Topics  . Smoking status: Current Every Day Smoker -- 1.00 packs/day for 20 years  . Smokeless tobacco: None  . Alcohol Use: No    Review of Systems  Constitutional: Negative for fever. Cardiovascular: Negative for chest pain. Respiratory: Negative for shortness of breath. Gastrointestinal: Negative for abdominal pain, vomiting and diarrhea. Neurological: Negative for headaches, focal weakness or numbness.  10-point ROS otherwise negative.  ____________________________________________   PHYSICAL EXAM:  VITAL SIGNS: ED Triage Vitals  Enc Vitals Group     BP 06/19/15 1535 99/66 mmHg     Pulse Rate 06/19/15 1535 92     Resp 06/19/15 1535 24     Temp 06/19/15 1535 98.2 F (36.8 C)     Temp Source 06/19/15 1535 Oral     SpO2 06/19/15 1535 94 %     Weight 06/19/15 1535 196 lb (88.905 kg)     Height 06/19/15 1535 6' (1.829 m)     Head Cir --      Peak Flow --      Pain Score 06/19/15 1536 10   Constitutional: Alert and oriented. Well appearing and in  no distress. Occasional cough Eyes: Conjunctivae are normal. PERRL. Normal extraocular movements. ENT   Head: Normocephalic and atraumatic.   Nose: No congestion/rhinnorhea.   Mouth/Throat: Mucous membranes are moist.   Neck: No stridor. Hematological/Lymphatic/Immunilogical: No cervical lymphadenopathy. Cardiovascular: Normal rate, regular rhythm.  No murmurs, rubs, or gallops. Well-healing sternotomy incision site. Respiratory: Normal respiratory effort without tachypnea nor retractions. Breath sounds are clear and equal bilaterally. No wheezes/rales/rhonchi. Gastrointestinal: Soft and nontender. No distention. There is no CVA tenderness. Genitourinary: Deferred Musculoskeletal: Normal range of motion in all extremities. No joint effusions.  No lower extremity tenderness nor edema. Neurologic:  Normal speech and language. No gross focal neurologic deficits are appreciated.  Skin:  Skin is warm, dry and intact. No rash noted. Psychiatric: Mood and affect are normal. Speech and behavior are normal. Patient exhibits appropriate insight and judgment.  ____________________________________________    LABS (pertinent positives/negatives)  Labs Reviewed  CBC WITH DIFFERENTIAL/PLATELET - Abnormal; Notable for the following:    WBC 13.1 (*)    RBC 4.02 (*)    Hemoglobin 12.0 (*)    HCT 36.2 (*)    Neutro Abs 10.9 (*)    Lymphs Abs 0.8 (*)    All other components within normal limits  BASIC METABOLIC PANEL - Abnormal; Notable for the following:    Sodium 130 (*)    Chloride 100 (*)    CO2 19 (*)    Calcium 8.4 (*)    All other components within normal limits  CULTURE, BLOOD (ROUTINE X 2)  CULTURE, BLOOD (ROUTINE X 2)  TROPONIN I  LACTIC ACID, PLASMA  LACTIC ACID, PLASMA    ____________________________________________   EKG  I, Nance Pear, attending physician, personally viewed and interpreted this EKG  EKG Time: 1543 Rate: 95 Rhythm: normal sinus  rhythm Axis: left axis deviation Intervals: qtc 477 QRS: narrow ST changes: no st elevation Impression: abnormal ekg ____________________________________________    RADIOLOGY  CXR  IMPRESSION: 1. Prior CABG. No pulmonary venous congestion.  2. Mild left mid lung field and left base infiltrate. Small left pleural effusion . Follow-up chest x-rays to demonstrate clearing suggested.  ____________________________________________   PROCEDURES  Procedure(s) performed: None  Critical Care performed: No  ____________________________________________   INITIAL IMPRESSION /  ASSESSMENT AND PLAN / ED COURSE  Pertinent labs & imaging results that were available during my care of the patient were reviewed by me and considered in my medical decision making (see chart for details).  She presented to the emergency department today because of concerns for cough, congestion and some shortness of breath. Patient recently underwent CABG [facility]. X-ray today shows pneumonia. Patient does have a mild leukocytosis. Will plan on treating for healthcare associated pneumonia. Will admit to the hospital service.  ____________________________________________   FINAL CLINICAL IMPRESSION(S) / ED DIAGNOSES  Final diagnoses:  Healthcare-associated pneumonia     Nance Pear, MD 06/19/15 [id number]

## 2015-06-19 NOTE — Progress Notes (Signed)
Pharmacy Antibiotic Note  Bryce Haney is a 65 y.o. male admitted on 06/19/2015 with pneumonia.  Pharmacy has been consulted for vancomycin & piperacillin/tazobactam dosing.  Plan: Piperacillin/tazobactam 3.375 g IV q8h EI  Patient received one dose of vancomycin 1000 mg in ED. Will order vancomycin 1000 mg IV q12h (with stacked dosing to begin 6 hours after initial dose at 0100 tomorrow morning).   Vancomycin trough goal 15-20 mcg/mL Vancomycin trough ordered for 4/15 @ 1230 which is prior to 5th dose and should represent steady state.  Kinetics: Ke: 0.074 Half-life: 9.4 hrs Vd: 62 L   Actual body weight: 88.9 kg  CrCl 79 mL/min  Height: 6' (182.9 cm) Weight: 196 lb (88.905 kg) IBW/kg (Calculated) : 77.6  Temp (24hrs), Avg:98.2 F (36.8 C), Min:98.2 F (36.8 C), Max:98.2 F (36.8 C)   Recent Labs Lab 06/19/15 1640  WBC 13.1*  CREATININE 1.06  LATICACIDVEN 1.0    Estimated Creatinine Clearance: 79.3 mL/min (by C-G formula based on Cr of 1.06).    Allergies  Allergen Reactions  . Morphine And Related Hives   Antimicrobials this admission: Piperacillin/tazobactam 4/13 >>  vancomycin 4/13 >>  Levofloxacin one dose in ED 4/13  Dose adjustments this admission: n/a  Microbiology results: 4/13 BCx: Sent  Thank you for allowing pharmacy to be a part of this patient's care.  Lenis Noon, PharmD Clinical Pharmacist 06/19/2015 7:29 PM

## 2015-06-19 NOTE — ED Notes (Signed)
Pt transported to room 209 

## 2015-06-19 NOTE — ED Notes (Signed)
Pt in X ray

## 2015-06-19 NOTE — H&P (Signed)
[facility] [place] NAME: Bryce Haney    MR#:  [id number]  DATE OF BIRTH:  March 08, 1951  DATE OF ADMISSION:  06/19/2015  PRIMARY CARE PHYSICIAN: Letta Median, MD   REQUESTING/REFERRING PHYSICIAN: Dr. Nance Pear  CHIEF COMPLAINT:   Chief Complaint  Patient presents with  . Cough  . Nasal Congestion  . Post-op Problem    HISTORY OF PRESENT ILLNESS:  Bryce Haney  is a 65 y.o. male with a known history of Coronary artery disease status post recent bypass, hypertension, hyperlipidemia, COPD, hypothyroidism, back pain who presents to the hospital due to generalized weakness, cough and congestion for the past week to 2 weeks. Patient had a recent coronary artery bypass graft surgery done about 3 weeks ago at [facility] and was then discharged home and had been doing well until the past week to 10 days he has developed generalized weakness, lethargy associated with cough productive sputum chills but no documented fever. He has not been improving and therefore came to the ER for further evaluation and underwent a chest x-ray which showed a left lower lobe infiltrate.  He had a leukocytosis and was ill appearing secondary to pneumonia and therefore hospitalist services were contacted further treatment and evaluation. She has noticed a cough which is productive with green yellow sputum, associated with chills but no documented fever. Positive chest pain at the sternotomy site, positive nausea but no vomiting but also poor by mouth intake. No abdominal pain, diarrhea.  PAST MEDICAL HISTORY:   Past Medical History  Diagnosis Date  . Myocardial infarction ([place])   . Back pain   . Thyroid disease   . COPD (chronic obstructive pulmonary disease) ([place])   . S/P CABG (coronary artery bypass graft)   . Hyperlipidemia   . Atrial fibrillation ([place])     PAST SURGICAL HISTORY:   Past Surgical History  Procedure Laterality Date  .  Coronary angioplasty    . Cardiac catheterization N/A 07/24/2014    Procedure: Left Heart Cath;  Surgeon: Corey Skains, MD;  Location: [facility];  Service: Cardiovascular;  Laterality: N/A;  . Coronary angioplasty with stent placement    . Coronary artery bypass graft      SOCIAL HISTORY:   Social History  Substance Use Topics  . Smoking status: Current Every Day Smoker -- 1.00 packs/day for 35 years  . Smokeless tobacco: Not on file  . Alcohol Use: No    FAMILY HISTORY:   Family History  Problem Relation Age of Onset  . Heart disease Mother   . Heart disease Father     DRUG ALLERGIES:   Allergies  Allergen Reactions  . Morphine And Related Hives    REVIEW OF SYSTEMS:   Review of Systems  Constitutional: Positive for malaise/fatigue. Negative for fever and weight loss.  HENT: Negative for congestion, nosebleeds and tinnitus.   Eyes: Negative for blurred vision, double vision and redness.  Respiratory: Positive for cough, sputum production and shortness of breath. Negative for hemoptysis.   Cardiovascular: Negative for chest pain, orthopnea, leg swelling and PND.  Gastrointestinal: Negative for nausea, vomiting, abdominal pain, diarrhea and melena.  Genitourinary: Negative for dysuria, urgency and hematuria.  Musculoskeletal: Negative for joint pain and falls.  Neurological: Positive for weakness. Negative for dizziness, tingling, sensory change, focal weakness, seizures and headaches.  Endo/Heme/Allergies: Negative for polydipsia. Does not bruise/bleed easily.  Psychiatric/Behavioral: Negative for depression and memory loss. The patient is  not nervous/anxious.     MEDICATIONS AT HOME:   Prior to Admission medications   Medication Sig Start Date End Date Taking? Authorizing Provider  amiodarone (PACERONE) 200 MG tablet Take 200 mg by mouth daily.   Yes Historical Provider, MD  aspirin 81 MG tablet Take 81 mg by mouth daily.   Yes Historical Provider,  MD  atorvastatin (LIPITOR) 40 MG tablet Take 40 mg by mouth daily.   Yes Historical Provider, MD  cyclobenzaprine (FLEXERIL) 10 MG tablet Take 10 mg by mouth at bedtime as needed for muscle spasms.   Yes Historical Provider, MD  furosemide (LASIX) 20 MG tablet Take 20 mg by mouth 2 (two) times daily.   Yes Historical Provider, MD  gabapentin (NEURONTIN) 300 MG capsule Take 300 mg by mouth at bedtime.    Yes Historical Provider, MD  levothyroxine (SYNTHROID, LEVOTHROID) 150 MCG tablet Take 150 mcg by mouth daily before breakfast.   Yes Historical Provider, MD  metoprolol tartrate (LOPRESSOR) 25 MG tablet Take 12.5 mg by mouth 2 (two) times daily.   Yes Historical Provider, MD  mirtazapine (REMERON) 30 MG tablet Take 30 mg by mouth at bedtime.   Yes Historical Provider, MD  potassium chloride SA (K-DUR,KLOR-CON) 20 MEQ tablet Take 20 mEq by mouth daily.   Yes Historical Provider, MD  ranitidine (ZANTAC) 150 MG tablet Take 150 mg by mouth 2 (two) times daily.   Yes Historical Provider, MD  warfarin (COUMADIN) 2 MG tablet Take 2 mg by mouth daily.   Yes Historical Provider, MD      VITAL SIGNS:  Blood pressure 110/75, pulse 85, temperature 98.2 F (36.8 C), temperature source Oral, resp. rate 26, height 6' (1.829 m), weight 88.905 kg (196 lb), SpO2 91 %.  PHYSICAL EXAMINATION:  Physical Exam  GENERAL:  65 y.o.-year-old patient lying in the bed in no acute distress.  EYES: Pupils equal, round, reactive to light and accommodation. No scleral icterus. Extraocular muscles intact.  HEENT: Head atraumatic, normocephalic. Oropharynx and nasopharynx clear. No oropharyngeal erythema, moist oral mucosa  NECK:  Supple, no jugular venous distention. No thyroid enlargement, no tenderness.  LUNGS: Prolonged inspiratory and expiratory phase, no wheezing, rhonchi, positive rales at the left base. No use of accessory muscles of respiration.  CARDIOVASCULAR: S1, S2 RRR. No murmurs, rubs, gallops, clicks.   ABDOMEN: Soft, nontender, nondistended. Bowel sounds present. No organomegaly or mass.  EXTREMITIES: No pedal edema, cyanosis, or clubbing. + 2 pedal & radial pulses b/l.   NEUROLOGIC: Cranial nerves II through XII are intact. No focal Motor or sensory deficits appreciated b/l PSYCHIATRIC: The patient is alert and oriented x 3. Good affect.  SKIN: No obvious rash, lesion, or ulcer.   LABORATORY PANEL:   CBC  Recent Labs Lab 06/19/15 1640  WBC 13.1*  HGB 12.0*  HCT 36.2*  PLT 294   ------------------------------------------------------------------------------------------------------------------  Chemistries   Recent Labs Lab 06/19/15 1640  NA 130*  K 3.9  CL 100*  CO2 19*  GLUCOSE 87  BUN 14  CREATININE 1.06  CALCIUM 8.4*   ------------------------------------------------------------------------------------------------------------------  Cardiac Enzymes  Recent Labs Lab 06/19/15 1640  TROPONINI 0.03   ------------------------------------------------------------------------------------------------------------------  RADIOLOGY:  Dg Chest 2 View  06/19/2015  CLINICAL DATA:  Shortness of breath. EXAM: CHEST  2 VIEW COMPARISON:  05/22/2015. FINDINGS: Mediastinum hilar structures normal. Prior CABG. Cardiomegaly with normal pulmonary vascularity. Mild left mid lung and left base infiltrate. Small left pleural effusion. No pneumothorax. IMPRESSION: 1. Prior CABG.  No pulmonary  venous congestion. 2. Mild left mid lung field and left base infiltrate. Small left pleural effusion . Follow-up chest x-rays to demonstrate clearing suggested. Electronically Signed   By: Marcello Moores  Register   On: 06/19/2015 16:15     IMPRESSION AND PLAN:   65 year old male with past medical history of coronary artery disease status post recent bypass, hypertension, hyperlipidemia, hypothyroidism, atrial fibrillation who presented to the hospital due to generalized weakness, cough, shortness of  breath.  1. Pneumonia-this is the cause of patient's shortness of breath, weakness and cough. -This is likely a nosocomial pneumonia given his recent hospitalization for his bypass surgery. -I will start him on IV vancomycin, Zosyn. Follow blood, sputum cultures.  2. Leukocytosis-secondary to pneumonia. Follow with IV antibiotic therapy.  3. Essential hypertension-continue metoprolol  4. History of chronic atrial fibrillation-continue amiodarone, metoprolol for rate control. -Continue Coumadin, check INR.  5. Hyperlipidemia-continue atorvastatin.  6. Hypothyroidism-continue Synthroid.  All the records are reviewed and case discussed with ED provider. Management plans discussed with the patient, family and they are in agreement.  CODE STATUS: Full code  TOTAL TIME TAKING CARE OF THIS PATIENT: 45 minutes.    Henreitta Leber M.D on 06/19/2015 at 7:25 PM  Between 7am to 6pm - Pager - [phone]  After 6pm go to [email or web address] - password [email or web address] [facility]  Office  [phone]  CC: Primary care physician; Letta Median, MD

## 2015-06-20 LAB — CBC
HCT: 33.3 % — ABNORMAL LOW (ref 40.0–52.0)
Hemoglobin: 11.5 g/dL — ABNORMAL LOW (ref 13.0–18.0)
MCH: 30.6 pg (ref 26.0–34.0)
MCHC: 34.5 g/dL (ref 32.0–36.0)
MCV: 88.7 fL (ref 80.0–100.0)
PLATELETS: 291 10*3/uL (ref 150–440)
RBC: 3.76 MIL/uL — ABNORMAL LOW (ref 4.40–5.90)
RDW: 14.5 % (ref 11.5–14.5)
WBC: 8.4 10*3/uL (ref 3.8–10.6)

## 2015-06-20 LAB — BASIC METABOLIC PANEL
ANION GAP: 6 (ref 5–15)
BUN: 10 mg/dL (ref 6–20)
CALCIUM: 8.3 mg/dL — AB (ref 8.9–10.3)
CO2: 22 mmol/L (ref 22–32)
Chloride: 106 mmol/L (ref 101–111)
Creatinine, Ser: 1.01 mg/dL (ref 0.61–1.24)
GLUCOSE: 85 mg/dL (ref 65–99)
POTASSIUM: 4.1 mmol/L (ref 3.5–5.1)
Sodium: 134 mmol/L — ABNORMAL LOW (ref 135–145)

## 2015-06-20 LAB — PROTIME-INR
INR: 1.39
PROTHROMBIN TIME: 17.2 s — AB (ref 11.4–15.0)

## 2015-06-20 MED ORDER — GUAIFENESIN ER 600 MG PO TB12
600.0000 mg | ORAL_TABLET | Freq: Two times a day (BID) | ORAL | Status: DC
  Administered 2015-06-20 – 2015-06-21 (×3): 600 mg via ORAL
  Filled 2015-06-20 (×3): qty 1

## 2015-06-20 MED ORDER — METOPROLOL TARTRATE 25 MG PO TABS
12.5000 mg | ORAL_TABLET | Freq: Two times a day (BID) | ORAL | Status: DC
  Administered 2015-06-20: 12.5 mg via ORAL
  Filled 2015-06-20 (×3): qty 1

## 2015-06-20 MED ORDER — ENSURE ENLIVE PO LIQD
237.0000 mL | Freq: Two times a day (BID) | ORAL | Status: DC
  Administered 2015-06-20 – 2015-06-21 (×2): 237 mL via ORAL

## 2015-06-20 NOTE — Progress Notes (Addendum)
Pt. Admitted to unit approx. 20:15, 4/13 diagnosed with pneumonia. Pt had open heart surgery 3 weeks ago and noted he missed his post op appointment due to feeling ill. Pt. placed on TELE. Pt had one dose of Vancomycin and later complained of feeling hot and sweaty, but temp was 97.9. He also noted that the IV site where the ABT was infused burned, itched and hurt a lot.The site was  red, and the IV was DC. MD notified. Will continue to monitor if these symptoms reoccur with next dose. Pt. Has no HX of being allergic to Vancomycin.

## 2015-06-20 NOTE — Progress Notes (Signed)
Initial Nutrition Assessment     INTERVENTION:  -Monitor intake and cater to pt preferences -Recommend Ensure Enlive po BID, each supplement provides 350 kcal and 20 grams of protein -Pt currently does not meet criteria for AND/ASPEN guidelines for malnutrition. Will continue to assess.    NUTRITION DIAGNOSIS:   Inadequate oral intake related to acute illness as evidenced by per patient/family report.    GOAL:   Patient will meet greater than or equal to 90% of their needs    MONITOR:   PO intake, Supplement acceptance  REASON FOR ASSESSMENT:   Malnutrition Screening Tool    ASSESSMENT:   65 y/o male admitted with weakness, cough, congestion over the last 2 weeks.  Found to have pneumonia  Past Medical History  Diagnosis Date  . Myocardial infarction ([place])   . Back pain   . Thyroid disease   . COPD (chronic obstructive pulmonary disease) ([place])   . S/P CABG (coronary artery bypass graft)   . Hyperlipidemia   . Atrial fibrillation ([place])    Pt reports ate few bites of lunch today, noted ate 100% of breakfast. Pt reports poor po intake for the past week prior to admission, eating bites  Medications reviewed: lasix, remeron, KCL Labs reviewed: Na 134, calcium 8.3  Nutrition-Focused physical exam completed. Findings are no fat depletion, mostly normal -mild muscle depletion, and no edema.     Diet Order:  Diet Heart Room service appropriate?: Yes; Fluid consistency:: Thin  Skin:  Reviewed, no issues  Last BM:  none documented  Height:   Ht Readings from Last 1 Encounters:  06/19/15 6' (1.829 m)    Weight: pt reports UBW of 196 pounds, noted current wt of 190 pounds   Wt Readings from Last 1 Encounters:  06/19/15 190 lb 1.6 oz (86.229 kg)   Wt Readings from Last 10 Encounters:  06/19/15 190 lb 1.6 oz (86.229 kg)  05/22/15 185 lb (83.915 kg)  11/12/14 195 lb (88.451 kg)  07/24/14 188 lb (85.276 kg)     Ideal Body Weight:     BMI:  Body mass  index is 25.78 kg/(m^2).  Estimated Nutritional Needs:   Kcal:  [id number] kcals/d.   Protein:  86-103 g/d  Fluid:  2-2.5 L/d  EDUCATION NEEDS:   No education needs identified at this time  Alyssandra Hulsebus B. Zenia Resides, [place], [place] (pager) Weekend/On-Call pager [phone])

## 2015-06-20 NOTE — Evaluation (Signed)
Physical Therapy Evaluation Patient Details Name: Bryce Haney MRN: [id number] DOB: Oct 29, 1950 Today's Date: 06/20/2015   History of Present Illness  Pt is a 65 y.o. M admitted to hospital for cough and SOB. Pt diagnosed with pneumonia. 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 Pt has hx of MI, COPD, and thyroid disease.  Clinical Impression  Pt is a pleasant 65 y.o. M admitted to hospital for pneumonia. Prior to admission, pt was independent with all ADLs. Pt had been living with family since CABG on 3/23 to have assist with ADLs. Pt alert and oriented during evaluation. Prior to evaluation, PT assessed pts BP. BP in supine: 94/73, in sitting: 103/77, in standing: 101/69. Pt did not express feeling any dizziness during mobility. Pt demonstrated good B UE and LE strength. Pt able to perform bed mobility independently. Pt able to transfer from EOB independently with increased time to stand and upright posture. Pt able to ambulate in room w/o AD and with supervision. Pt expressed feeling weaker than normal, but never became unsteady during ambulation. Pt demonstrates deficits in mobility and LE strength compared to baseline. Pt would benefit from further skilled PT to address deficits and return to PLOF; recommend pt receive home health after discharge from acute hospitalization.     Follow Up Recommendations Home health PT    Equipment Recommendations       Recommendations for Other Services       Precautions / Restrictions Precautions Precautions: Fall Restrictions Weight Bearing Restrictions: No      Mobility  Bed Mobility Overal bed mobility: Independent             General bed mobility comments: Pt able to perform bed mobility without use of railings.   Transfers Overall transfer level: Modified independent Equipment used: None             General transfer comment: Pt able to perform sit to stand from EOB independetly. Pt required increased time to stand and upright  posture.  Ambulation/Gait Ambulation/Gait assistance: Independent Ambulation Distance (Feet): 15 Feet Assistive device: None Gait Pattern/deviations: Step-through pattern     General Gait Details: Pt able to ambulate in room with supervision for safety. Pt demonstrated step-through gait pattern at slow speed. No LOB or unsteadiness.   Stairs            Wheelchair Mobility    Modified Rankin (Stroke Patients Only)       Balance Overall balance assessment: Independent                                           Pertinent Vitals/Pain Pain Assessment: No/denies pain    Home Living Family/patient expects to be discharged to:: Private residence Living Arrangements: Alone Available Help at Discharge: Family Type of Home: House Home Access: Stairs to enter Entrance Stairs-Rails: Right Entrance Stairs-Number of Steps: 2 Home Layout: One level Home Equipment: Environmental consultant - 2 wheels;Cane - quad Additional Comments: Pt lives alone, however has been staying with niece/nephew since surgery on 3/23. Pt has ADs at home, but stated he does not use them.    Prior Function Level of Independence: Independent         Comments: Since surgery, pt has been living w/family. Pt able to perform ADLs, however family available to help if needed t/o day. Pt was active before surgery.     Hand Dominance  Extremity/Trunk Assessment   Upper Extremity Assessment: RUE deficits/detail;LUE deficits/detail RUE Deficits / Details: R UE grossly 5/5 strength     LUE Deficits / Details: L UE grossly 5/5   Lower Extremity Assessment: RLE deficits/detail;LLE deficits/detail RLE Deficits / Details: R LE grossly 4+/5 LLE Deficits / Details: L LE grossly 4+/5     Communication   Communication: No difficulties  Cognition Arousal/Alertness: Awake/alert Behavior During Therapy: WFL for tasks assessed/performed Overall Cognitive Status: Within Functional Limits for tasks  assessed                      General Comments      Exercises        Assessment/Plan    PT Assessment Patient needs continued PT services  PT Diagnosis Abnormality of gait   PT Problem List Decreased strength;Decreased mobility  PT Treatment Interventions Gait training;Therapeutic exercise   PT Goals (Current goals can be found in the Care Plan section) Acute Rehab PT Goals Patient Stated Goal: to return to independence w/ADLs PT Goal Formulation: With patient Time For Goal Achievement: 07/04/15 Potential to Achieve Goals: Good    Frequency Min 2X/week   Barriers to discharge        Co-evaluation               End of Session   Activity Tolerance: Patient tolerated treatment well Patient left: in bed;with bed alarm set;with nursing/sitter in room;with family/visitor present Nurse Communication: Mobility status         Time: 1525-1540 PT Time Calculation (min) (ACUTE ONLY): 15 min   Charges:         PT G Codes:        Sherral Hammers 2015/06/25, 4:06 PM M. Barnett Abu, SPT

## 2015-06-20 NOTE — Progress Notes (Signed)
[place] at [facility]                                                                                                                                                                                            Patient Demographics   Bryce Haney, is a 66 y.o. male, DOB - Apr 18, 1950, [id number]  Admit date - 06/19/2015   Admitting Physician Henreitta Leber, MD  Outpatient Primary MD for the patient is Bender, Durene Cal, MD   LOS - 1  Subjective: Patient is feeling little better continues to have cough and shortness of breath feels very weak no chest pains     Review of Systems:   Review of Systems  Constitutional: Positive for malaise/fatigue. Negative for fever and weight loss.  HENT: Negative for congestion, nosebleeds and tinnitus.  Eyes: Negative for blurred vision, double vision and redness.  Respiratory: Positive for cough, sputum production and shortness of breath. Negative for hemoptysis.  Cardiovascular: Negative for chest pain, orthopnea, leg swelling and PND.  Gastrointestinal: Negative for nausea, vomiting, abdominal pain, diarrhea and melena.  Genitourinary: Negative for dysuria, urgency and hematuria.  Musculoskeletal: Negative for joint pain and falls.  Neurological: Positive for weakness. Negative for dizziness, tingling, sensory change, focal weakness, seizures and headaches.  Endo/Heme/Allergies: Negative for polydipsia. Does not bruise/bleed easily.  Psychiatric/Behavioral: Negative for depression and memory loss. The patient is not nervous/anxious  Vitals:   Filed Vitals:   06/20/15 0345 06/20/15 0507 06/20/15 0508 06/20/15 1156  BP:  89/65 93/64 88/58   Pulse:  73 75 74  Temp: 97.9 F (36.6 C) 97.9 F (36.6 C)    TempSrc:  Oral    Resp:  16    Height:      Weight:      SpO2:  98% 97%     Wt Readings from Last 3 Encounters:  06/19/15 86.229 kg (190 lb 1.6 oz)  05/22/15 83.915 kg (185 lb)  11/12/14  88.451 kg (195 lb)     Intake/Output Summary (Last 24 hours) at 06/20/15 1258 Last data filed at 06/20/15 0900  Gross per 24 hour  Intake    730 ml  Output   1075 ml  Net   -345 ml    Physical Exam:   GENERAL: Pleasant-appearing in no apparent distress.  HEAD, EYES, EARS, NOSE AND THROAT: Atraumatic, normocephalic. Extraocular muscles are intact. Pupils equal and reactive to light. Sclerae anicteric. No conjunctival injection. No oro-pharyngeal erythema.  NECK: Supple. There is no jugular venous distention. No bruits, no lymphadenopathy, no thyromegaly.  HEART: Regular rate and rhythm,. No murmurs, no rubs,  no clicks.  LUNGS: Rales at the left lung base no wheezing or rhonchi noted ABDOMEN: Soft, flat, nontender, nondistended. Has good bowel sounds. No hepatosplenomegaly appreciated.  EXTREMITIES: No evidence of any cyanosis, clubbing, or peripheral edema.  +2 pedal and radial pulses bilaterally.  NEUROLOGIC: The patient is alert, awake, and oriented x3 with no focal motor or sensory deficits appreciated bilaterally.  SKIN: Moist and warm with no rashes appreciated.  Psych: Not anxious, depressed LN: No inguinal LN enlargement    Antibiotics   Anti-infectives    Start     Dose/Rate Route Frequency Ordered Stop   06/20/15 0600  piperacillin-tazobactam (ZOSYN) IVPB 3.375 g     3.375 g 12.5 mL/hr over 240 Minutes Intravenous 3 times per day 06/19/15 2037     06/20/15 0100  vancomycin (VANCOCIN) IVPB 1000 mg/200 mL premix     1,000 mg 200 mL/hr over 60 Minutes Intravenous Every 12 hours 06/19/15 2037     06/19/15 1815  levofloxacin (LEVAQUIN) IVPB 750 mg     750 mg 100 mL/hr over 90 Minutes Intravenous  Once 06/19/15 1809 06/19/15 1959   06/19/15 1815  vancomycin (VANCOCIN) IVPB 1000 mg/200 mL premix     1,000 mg 200 mL/hr over 60 Minutes Intravenous  Once 06/19/15 1809 06/19/15 1937   06/19/15 1815  piperacillin-tazobactam (ZOSYN) IVPB 3.375 g     3.375 g 100 mL/hr over 30  Minutes Intravenous  Once 06/19/15 1809 06/19/15 1907      Medications   Scheduled Meds: . amiodarone  200 mg Oral Daily  . aspirin EC  81 mg Oral Daily  . atorvastatin  40 mg Oral Daily  . enoxaparin (LOVENOX) injection  40 mg Subcutaneous Q24H  . famotidine  20 mg Oral BID  . furosemide  20 mg Oral BID  . gabapentin  300 mg Oral QHS  . levothyroxine  150 mcg Oral QAC breakfast  . metoprolol tartrate  12.5 mg Oral BID  . mirtazapine  30 mg Oral QHS  . piperacillin-tazobactam (ZOSYN)  IV  3.375 g Intravenous 3 times per day  . potassium chloride SA  20 mEq Oral Daily  . vancomycin  1,000 mg Intravenous Q12H  . warfarin  2 mg Oral Daily  . Warfarin - Physician Dosing Inpatient   Does not apply q1800   Continuous Infusions:  PRN Meds:.acetaminophen **OR** acetaminophen, guaiFENesin-dextromethorphan, ondansetron **OR** ondansetron (ZOFRAN) IV   Data Review:   Micro Results No results found for this or any previous visit (from the past 240 hour(s)).  Radiology Reports Dg Chest 2 View  06/19/2015  CLINICAL DATA:  Shortness of breath. EXAM: CHEST  2 VIEW COMPARISON:  05/22/2015. FINDINGS: Mediastinum hilar structures normal. Prior CABG. Cardiomegaly with normal pulmonary vascularity. Mild left mid lung and left base infiltrate. Small left pleural effusion. No pneumothorax. IMPRESSION: 1. Prior CABG.  No pulmonary venous congestion. 2. Mild left mid lung field and left base infiltrate. Small left pleural effusion . Follow-up chest x-rays to demonstrate clearing suggested. Electronically Signed   By: Marcello Moores  Register   On: 06/19/2015 16:15   Dg Chest 2 View  05/22/2015  CLINICAL DATA:  Chest pain EXAM: CHEST  2 VIEW COMPARISON:  11/26/2012 FINDINGS: The heart size and mediastinal contours are within normal limits. Both lungs are clear. The visualized skeletal structures are unremarkable. IMPRESSION: No active cardiopulmonary disease. Electronically Signed   By: Franchot Gallo M.D.   On:  05/22/2015 15:10     CBC  Recent Labs  Lab 06/19/15 1640 06/20/15 0440  WBC 13.1* 8.4  HGB 12.0* 11.5*  HCT 36.2* 33.3*  PLT 294 291  MCV 89.9 88.7  MCH 29.9 30.6  MCHC 33.2 34.5  RDW 14.5 14.5  LYMPHSABS 0.8*  --   MONOABS 1.0  --   EOSABS 0.3  --   BASOSABS 0.0  --     Chemistries   Recent Labs Lab 06/19/15 1640 06/20/15 0440  NA 130* 134*  K 3.9 4.1  CL 100* 106  CO2 19* 22  GLUCOSE 87 85  BUN 14 10  CREATININE 1.06 1.01  CALCIUM 8.4* 8.3*   ------------------------------------------------------------------------------------------------------------------ estimated creatinine clearance is 83.2 mL/min (by C-G formula based on Cr of 1.01). ------------------------------------------------------------------------------------------------------------------ No results for input(s): HGBA1C in the last 72 hours. ------------------------------------------------------------------------------------------------------------------ No results for input(s): CHOL, HDL, LDLCALC, TRIG, CHOLHDL, LDLDIRECT in the last 72 hours. ------------------------------------------------------------------------------------------------------------------ No results for input(s): TSH, T4TOTAL, T3FREE, THYROIDAB in the last 72 hours.  Invalid input(s): FREET3 ------------------------------------------------------------------------------------------------------------------ No results for input(s): VITAMINB12, FOLATE, FERRITIN, TIBC, IRON, RETICCTPCT in the last 72 hours.  Coagulation profile  Recent Labs Lab 06/19/15 1640 06/20/15 0440  INR 1.35 1.39    No results for input(s): DDIMER in the last 72 hours.  Cardiac Enzymes  Recent Labs Lab 06/19/15 1640  TROPONINI 0.03   ------------------------------------------------------------------------------------------------------------------ Invalid input(s): POCBNP    Assessment & Plan   65 year old male with past medical history of  coronary artery disease status post recent bypass, hypertension, hyperlipidemia, hypothyroidism, atrial fibrillation who presented to the hospital due to generalized weakness, cough, shortness of breath.  1. Pneumonia- Is being treated HCAP Continue Zosyn and vancomycin patient seems to be responding His WBC is trending down Recommend patient ambulate. .  2. Leukocytosis-secondary to pneumonia. Improved with IV antibiotics   3. Essential hypertension-blood pressure intermittently low continue metoprolol with holding parameters  4. History of chronic atrial fibrillation-continue amiodarone, metoprolol for rate control. -Continue Coumadin, INR subtherapeutic pharmacy is dosing Coumadin  5. Hyperlipidemia-continue atorvastatin.  6. Hypothyroidism-continue Synthroid.      Code Status Orders        Start     Ordered   06/19/15 2037  Full code   Continuous     06/19/15 2036    Code Status History    Date Active Date Inactive Code Status Order ID Comments User Context   07/24/2014  8:25 AM 07/24/2014  1:43 PM Full Code [id number]  Corey Skains, MD Inpatient           Consults  PT DVT Prophylaxis  Lovenox  Lab Results  Component Value Date   PLT 291 06/20/2015     Time Spent in minutes35 minutes  Greater than 50% of time spent in care coordination and counseling patient regarding the condition and plan of care.   Dustin Flock M.D on 06/20/2015 at 12:58 PM  Between 7am to 6pm - Pager - [phone]  After 6pm go to [email or web address] - password [email or web address] [facility]   Office  [phone]

## 2015-06-21 LAB — EXPECTORATED SPUTUM ASSESSMENT W GRAM STAIN, RFLX TO RESP C

## 2015-06-21 LAB — EXPECTORATED SPUTUM ASSESSMENT W REFEX TO RESP CULTURE: SPECIAL REQUESTS: NORMAL

## 2015-06-21 LAB — BASIC METABOLIC PANEL
ANION GAP: 6 (ref 5–15)
BUN: 13 mg/dL (ref 6–20)
CALCIUM: 8.5 mg/dL — AB (ref 8.9–10.3)
CO2: 22 mmol/L (ref 22–32)
CREATININE: 1.18 mg/dL (ref 0.61–1.24)
Chloride: 104 mmol/L (ref 101–111)
GFR calc Af Amer: >60 mL/min (ref >60)
GLUCOSE: 87 mg/dL (ref 65–99)
Potassium: 4 mmol/L (ref 3.5–5.1)
Sodium: 132 mmol/L — ABNORMAL LOW (ref 135–145)

## 2015-06-21 LAB — PROTIME-INR
INR: 1.39
PROTHROMBIN TIME: 17.2 s — AB (ref 11.4–15.0)

## 2015-06-21 LAB — CBC
HCT: 36.8 % — ABNORMAL LOW (ref 40.0–52.0)
HEMOGLOBIN: 12.6 g/dL — AB (ref 13.0–18.0)
MCH: 30.8 pg (ref 26.0–34.0)
MCHC: 34.1 g/dL (ref 32.0–36.0)
MCV: 90.1 fL (ref 80.0–100.0)
PLATELETS: 351 10*3/uL (ref 150–440)
RBC: 4.09 MIL/uL — ABNORMAL LOW (ref 4.40–5.90)
RDW: 14.5 % (ref 11.5–14.5)
WBC: 8.2 10*3/uL (ref 3.8–10.6)

## 2015-06-21 LAB — VANCOMYCIN, TROUGH: Vancomycin Tr: 17 ug/mL (ref 10–20)

## 2015-06-21 MED ORDER — LEVOFLOXACIN 750 MG PO TABS: 750.0000 mg | ORAL_TABLET | Freq: Every day | ORAL | Status: DC

## 2015-06-21 NOTE — Discharge Instructions (Signed)

## 2015-06-21 NOTE — Progress Notes (Signed)
Pharmacy Antibiotic Note  Bryce Haney is a 65 y.o. male admitted on 06/19/2015 with pneumonia.  Pharmacy has been consulted for vancomycin & piperacillin/tazobactam dosing.  Plan: Piperacillin/tazobactam 3.375 g IV q8h EI  Patient received one dose of vancomycin 1000 mg in ED. Will order vancomycin 1000 mg IV q12h (with stacked dosing to begin 6 hours after initial dose at 0100 tomorrow morning).   Vancomycin trough goal 15-20 mcg/mL Vancomycin trough ordered for 4/15 @ 1230 which is prior to 5th dose and should represent steady state.  4/15 at 12:23  Trough = 17.  Will continue current dosing.  Kinetics: Ke: 0.074 Half-life: 9.4 hrs Vd: 62 L   Actual body weight: 88.9 kg  CrCl 79 mL/min  Height: 6' (182.9 cm) Weight: 190 lb 1.6 oz (86.229 kg) IBW/kg (Calculated) : 77.6  Temp (24hrs), Avg:97.8 F (36.6 C), Min:97.5 F (36.4 C), Max:98 F (36.7 C)   Recent Labs Lab 06/19/15 1640 06/19/15 1935 06/20/15 0440 06/21/15 0504 06/21/15 1223  WBC 13.1*  --  8.4 8.2  --   CREATININE 1.06  --  1.01 1.18  --   LATICACIDVEN 1.0 0.9  --   --   --   VANCOTROUGH  --   --   --   --  17    Estimated Creatinine Clearance: 71.2 mL/min (by C-G formula based on Cr of 1.18).    Allergies  Allergen Reactions  . Morphine And Related Hives   Antimicrobials this admission: Piperacillin/tazobactam 4/13 >>  vancomycin 4/13 >>  Levofloxacin one dose in ED 4/13  Dose adjustments this admission: n/a  Microbiology results: 4/13 BCx: [place] will follow and adjust as per consult.  Olivia Canter, [place] Clinical Pharmacist 06/21/2015 2:23 PM

## 2015-06-21 NOTE — Care Management Note (Addendum)
Case Management Note  Patient Details  Name: Bryce Haney MRN: [id number] Date of Birth: [birth year]  Subjective/Objective:      Discussed discharge planning with [name] who has had recent surgery. He has no current home health services. A referral for home health RN and PT faxed to [facility] who will accept straight Medicaid. .              Action/Plan:   Expected Discharge Date:                  Expected Discharge Plan:     In-House Referral:     Discharge planning Services     Post Acute Care Choice:    Choice offered to:     DME Arranged:    DME Agency:     HH Arranged:    [place] Agency:     Status of Service:     Medicare Important Message Given:    Date Medicare IM Given:    Medicare IM give by:    Date Additional Medicare IM Given:    Additional Medicare Important Message give by:     If discussed at [place] of Stay Meetings, dates discussed:    Additional Comments:  Leston Schueller A, RN 06/21/2015, 11:49 AM

## 2015-06-21 NOTE — Progress Notes (Signed)
MD ordered patient to be discharged home.  Discharge instructions were reviewed with the patient and nephew and they voiced understanding.  Prescription for levaquin was given to the patient.  IV was removed with catheter intact.  No new follow-up was given.  All questions were answered.  Patient left via wheelchair escorted by nursing.

## 2015-06-21 NOTE — Discharge Summary (Signed)
[facility] [place] NAME: Bryce Haney    MR#:  [id number]  DATE OF BIRTH:  1950-10-31  DATE OF ADMISSION:  06/19/2015 ADMITTING PHYSICIAN: Henreitta Leber, MD  DATE OF DISCHARGE: 06/21/2015  PRIMARY CARE PHYSICIAN: Letta Median, MD    ADMISSION DIAGNOSIS:  Healthcare-associated pneumonia [J18.9]  DISCHARGE DIAGNOSIS:  Active Problems:   Pneumonia   SECONDARY DIAGNOSIS:   Past Medical History  Diagnosis Date  . Myocardial infarction ([place])   . Back pain   . Thyroid disease   . COPD (chronic obstructive pulmonary disease) ([place])   . S/P CABG (coronary artery bypass graft)   . Hyperlipidemia   . Atrial fibrillation [facility])     HOSPITAL COURSE:   65 year old male with past medical history of coronary artery disease status post recent bypass, hypertension, hyperlipidemia, hypothyroidism, atrial fibrillation who presented to the hospital due to generalized weakness, cough, shortness of breath.  1. Pneumonia-this was the cause of patient's shortness of breath, weakness and cough. -This was likely a nosocomial pneumonia given his recent hospitalization for his bypass surgery. -Patient was treated with IV vancomycin, Zosyn on the hospital and has clinically improved. He is currently afebrile, hemodynamically stable. His cultures have remained negative. -He is now presently being discharged on oral Levaquin for 7 days.  2. Leukocytosis-as well as secondary to pneumonia. It has improved and resolved with IV antibiotic therapy.  3. Essential hypertension-he will continue metoprolol  4. History of chronic atrial fibrillation- he will continue amiodarone, metoprolol for rate control. -he will Continue Coumadin and his INR remained subtherapeutic while in the hospital and should be further followed as outpatient.   5. Hyperlipidemia- he will continue atorvastatin.  6. Hypothyroidism- he will continue Synthroid  Patient was seen  by physical therapy and recommended home health services and he was discharged with home health physical therapy and nursing services.   DISCHARGE CONDITIONS:   Stable.   CONSULTS OBTAINED:     DRUG ALLERGIES:   Allergies  Allergen Reactions  . Morphine And Related Hives    DISCHARGE MEDICATIONS:   Current Discharge Medication List    START taking these medications   Details  levofloxacin (LEVAQUIN) 750 MG tablet Take 1 tablet (750 mg total) by mouth daily. Qty: 7 tablet, Refills: 0      CONTINUE these medications which have NOT CHANGED   Details  amiodarone (PACERONE) 200 MG tablet Take 200 mg by mouth daily.    aspirin 81 MG tablet Take 81 mg by mouth daily.    atorvastatin (LIPITOR) 40 MG tablet Take 40 mg by mouth daily.    cyclobenzaprine (FLEXERIL) 10 MG tablet Take 10 mg by mouth at bedtime as needed for muscle spasms.    furosemide (LASIX) 20 MG tablet Take 20 mg by mouth 2 (two) times daily.    gabapentin (NEURONTIN) 300 MG capsule Take 300 mg by mouth at bedtime.     levothyroxine (SYNTHROID, LEVOTHROID) 150 MCG tablet Take 150 mcg by mouth daily before breakfast.    metoprolol tartrate (LOPRESSOR) 25 MG tablet Take 12.5 mg by mouth 2 (two) times daily.    mirtazapine (REMERON) 30 MG tablet Take 30 mg by mouth at bedtime.    potassium chloride SA (K-DUR,KLOR-CON) 20 MEQ tablet Take 20 mEq by mouth daily.    ranitidine (ZANTAC) 150 MG tablet Take 150 mg by mouth 2 (two) times daily.    warfarin (COUMADIN) 2 MG tablet Take 2 mg by  mouth daily.         DISCHARGE INSTRUCTIONS:   DIET:  Cardiac diet  DISCHARGE CONDITION:  Stable  ACTIVITY:  Activity as tolerated  OXYGEN:  Home Oxygen: No.   Oxygen Delivery: room air  DISCHARGE LOCATION:  Home with home health nursing and physical therapy   If you experience worsening of your admission symptoms, develop shortness of breath, life threatening emergency, suicidal or homicidal thoughts you  must seek medical attention immediately by calling 911 or calling your MD immediately  if symptoms less severe.  You Must read complete instructions/literature along with all the possible adverse reactions/side effects for all the Medicines you take and that have been prescribed to you. Take any new Medicines after you have completely understood and accpet all the possible adverse reactions/side effects.   Please note  You were cared for by a hospitalist during your hospital stay. If you have any questions about your discharge medications or the care you received while you were in the hospital after you are discharged, you can call the unit and asked to speak with the hospitalist on call if the hospitalist that took care of you is not available. Once you are discharged, your primary care physician will handle any further medical issues. Please note that NO REFILLS for any discharge medications will be authorized once you are discharged, as it is imperative that you return to your primary care physician (or establish a relationship with a primary care physician if you do not have one) for your aftercare needs so that they can reassess your need for medications and monitor your lab values.     Today   shortness of breath improved. No complaints presently.  VITAL SIGNS:  Blood pressure 98/71, pulse 79, temperature 97.5 F (36.4 C), temperature source Oral, resp. rate 20, height 6' (1.829 m), weight 86.229 kg (190 lb 1.6 oz), SpO2 95 %.  I/O:   Intake/Output Summary (Last 24 hours) at 06/21/15 1407 Last data filed at 06/21/15 1159  Gross per 24 hour  Intake    590 ml  Output   1600 ml  Net  -1010 ml    PHYSICAL EXAMINATION:  GENERAL:  65 y.o.-year-old patient lying in the bed with no acute distress.  EYES: Pupils equal, round, reactive to light and accommodation. No scleral icterus. Extraocular muscles intact.  HEENT: Head atraumatic, normocephalic. Oropharynx and nasopharynx clear.   NECK:  Supple, no jugular venous distention. No thyroid enlargement, no tenderness.  LUNGS: Normal breath sounds bilaterally, no wheezing, rales,rhonchi. No use of accessory muscles of respiration.  CARDIOVASCULAR: S1, S2 normal. No murmurs, rubs, or gallops. Positive midsternotomy scar. ABDOMEN: Soft, non-tender, non-distended. Bowel sounds present. No organomegaly or mass.  EXTREMITIES: No pedal edema, cyanosis, or clubbing.  NEUROLOGIC: Cranial nerves II through XII are intact. No focal motor or sensory defecits b/l.  PSYCHIATRIC: The patient is alert and oriented x 3. SKIN: No obvious rash, lesion, or ulcer.   DATA REVIEW:   CBC  Recent Labs Lab 06/21/15 0504  WBC 8.2  HGB 12.6*  HCT 36.8*  PLT 351    Chemistries   Recent Labs Lab 06/21/15 0504  NA 132*  K 4.0  CL 104  CO2 22  GLUCOSE 87  BUN 13  CREATININE 1.18  CALCIUM 8.5*    Cardiac Enzymes  Recent Labs Lab 06/19/15 1640  TROPONINI 0.03    Microbiology Results  Results for orders placed or performed during the hospital encounter of 06/19/15  Blood  culture (routine x 2)     Status: None (Preliminary result)   Collection Time: 06/19/15  4:40 PM  Result Value Ref Range Status   Specimen Description BLOOD LEFT HAND  Final   Special Requests BOTTLES DRAWN AEROBIC AND ANAEROBIC  1CC  Final   Culture NO GROWTH 2 DAYS  Final   Report Status PENDING  Incomplete  Blood culture (routine x 2)     Status: None (Preliminary result)   Collection Time: 06/19/15  4:45 PM  Result Value Ref Range Status   Specimen Description BLOOD RIGHT HAND  Final   Special Requests   Final    BOTTLES DRAWN AEROBIC AND ANAEROBIC AERO 8CC ANA [place]   Culture NO GROWTH 2 DAYS  Final   Report Status PENDING  Incomplete  Culture, sputum-assessment     Status: None   Collection Time: 06/21/15 10:40 AM  Result Value Ref Range Status   Specimen Description THROAT  Final   Special Requests Normal  Final   Sputum evaluation THIS  SPECIMEN IS ACCEPTABLE FOR SPUTUM CULTURE  Final   Report Status 06/21/2015 FINAL  Final    RADIOLOGY:  Dg Chest 2 View  06/19/2015  CLINICAL DATA:  Shortness of breath. EXAM: CHEST  2 VIEW COMPARISON:  05/22/2015. FINDINGS: Mediastinum hilar structures normal. Prior CABG. Cardiomegaly with normal pulmonary vascularity. Mild left mid lung and left base infiltrate. Small left pleural effusion. No pneumothorax. IMPRESSION: 1. Prior CABG.  No pulmonary venous congestion. 2. Mild left mid lung field and left base infiltrate. Small left pleural effusion . Follow-up chest x-rays to demonstrate clearing suggested. Electronically Signed   By: Marcello Moores  Register   On: 06/19/2015 16:15      Management plans discussed with the patient, family and they are in agreement.  CODE STATUS:     Code Status Orders        Start     Ordered   06/19/15 2037  Full code   Continuous     06/19/15 2036    Code Status History    Date Active Date Inactive Code Status Order ID Comments User Context   07/24/2014  8:25 AM 07/24/2014  1:43 PM Full Code [id number]  Corey Skains, MD Inpatient      TOTAL TIME TAKING CARE OF THIS PATIENT: 40 minutes.    Henreitta Leber M.D on 06/21/2015 at 2:07 PM  Between 7am to 6pm - Pager - [phone]  After 6pm go to [email or web address] - password [email or web address] [place] [facility]  Office  [phone]  CC: Primary care physician; Letta Median, MD

## 2015-06-24 LAB — CULTURE, BLOOD (ROUTINE X 2)
CULTURE: NO GROWTH
Culture: NO GROWTH

## 2015-06-24 LAB — CULTURE, RESPIRATORY: CULTURE: NORMAL

## 2015-06-24 LAB — CULTURE, RESPIRATORY W GRAM STAIN: Special Requests: NORMAL

## 2015-07-08 DIAGNOSIS — I480 Paroxysmal atrial fibrillation: Secondary | ICD-10-CM | POA: Insufficient documentation

## 2016-05-22 IMAGING — CR DG CHEST 2V
2 series · 2 of 2 positions shown · non-contrast
Comparison: 11/26/2012

CLINICAL DATA: Chest pain

EXAM:
CHEST  2 VIEW

[chest pa]
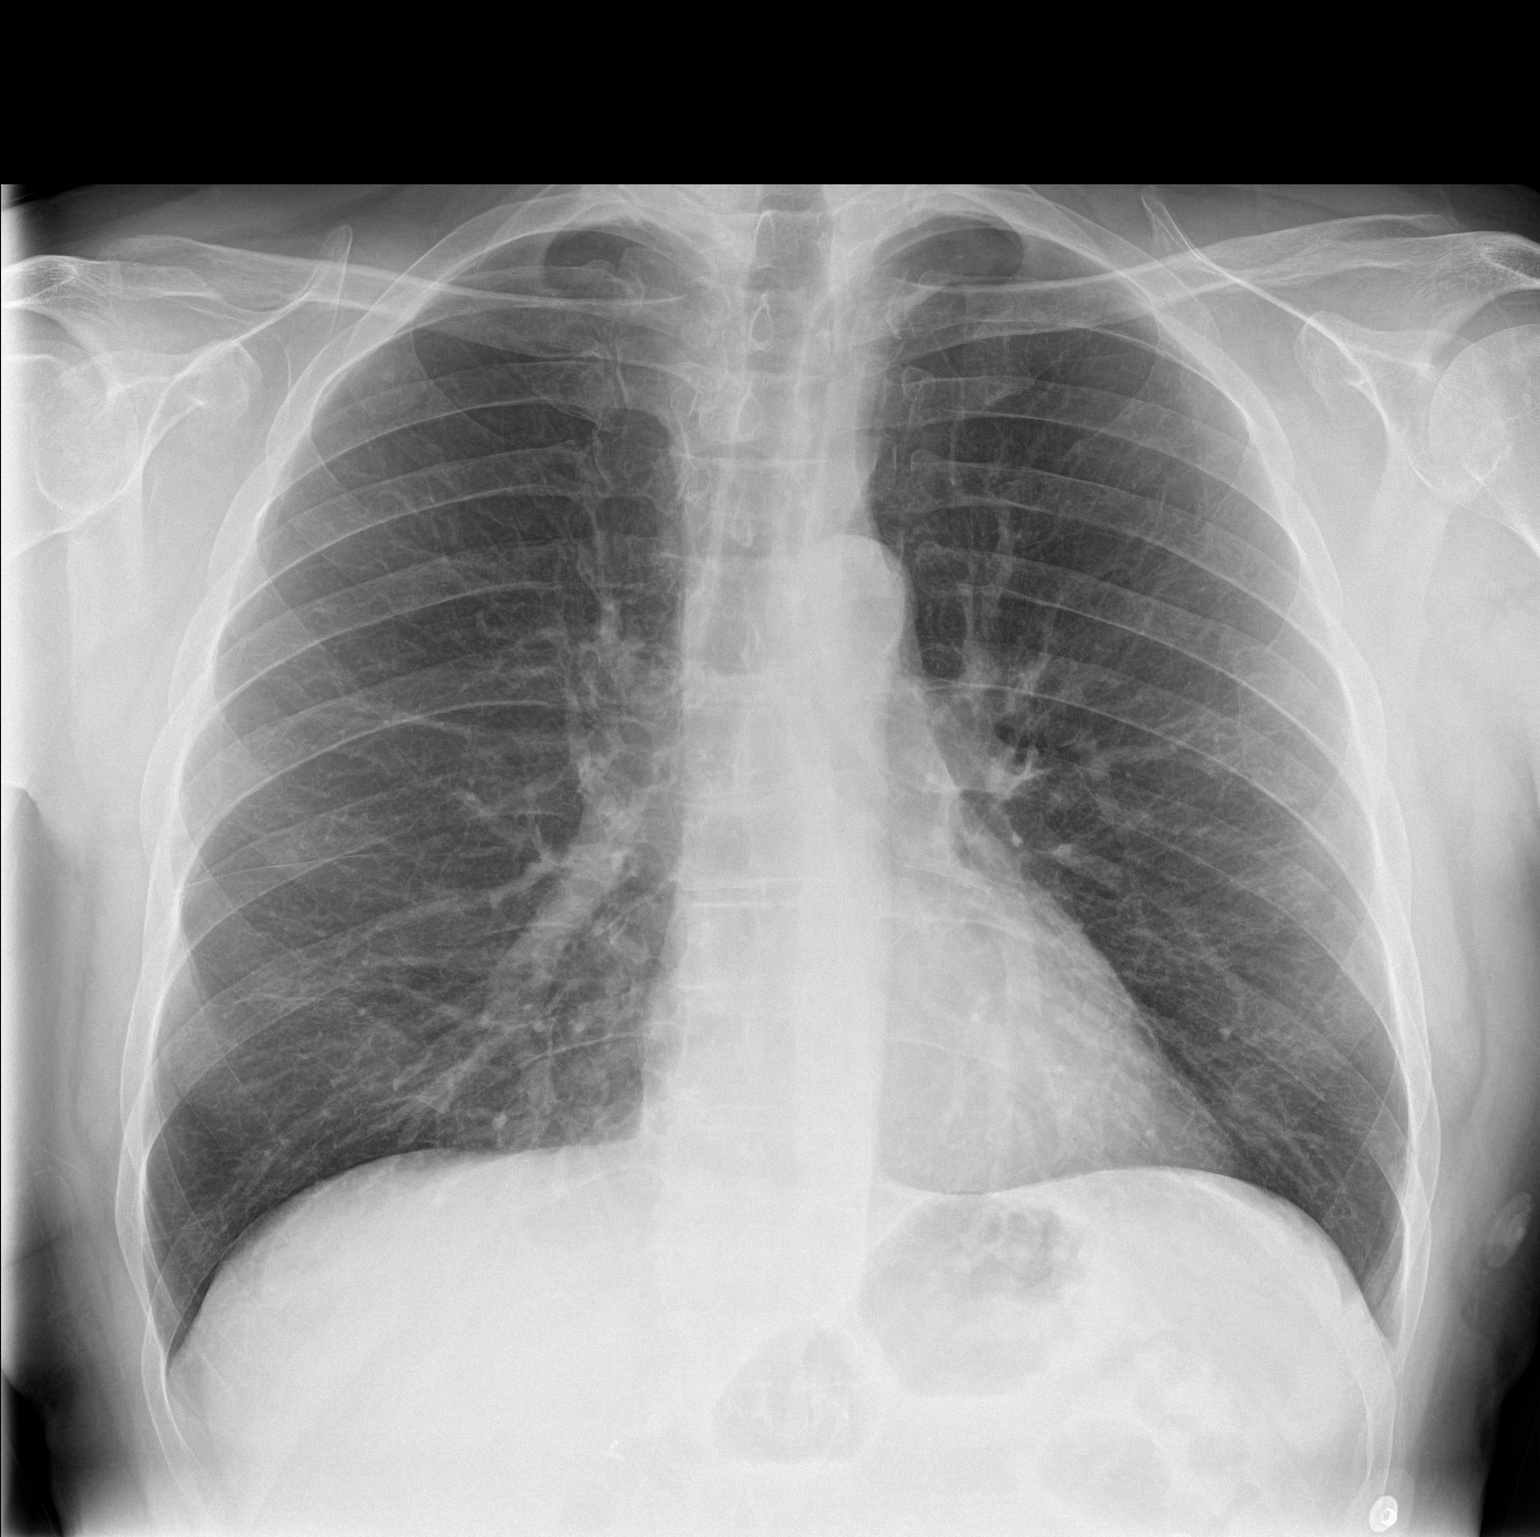

[chest lat]
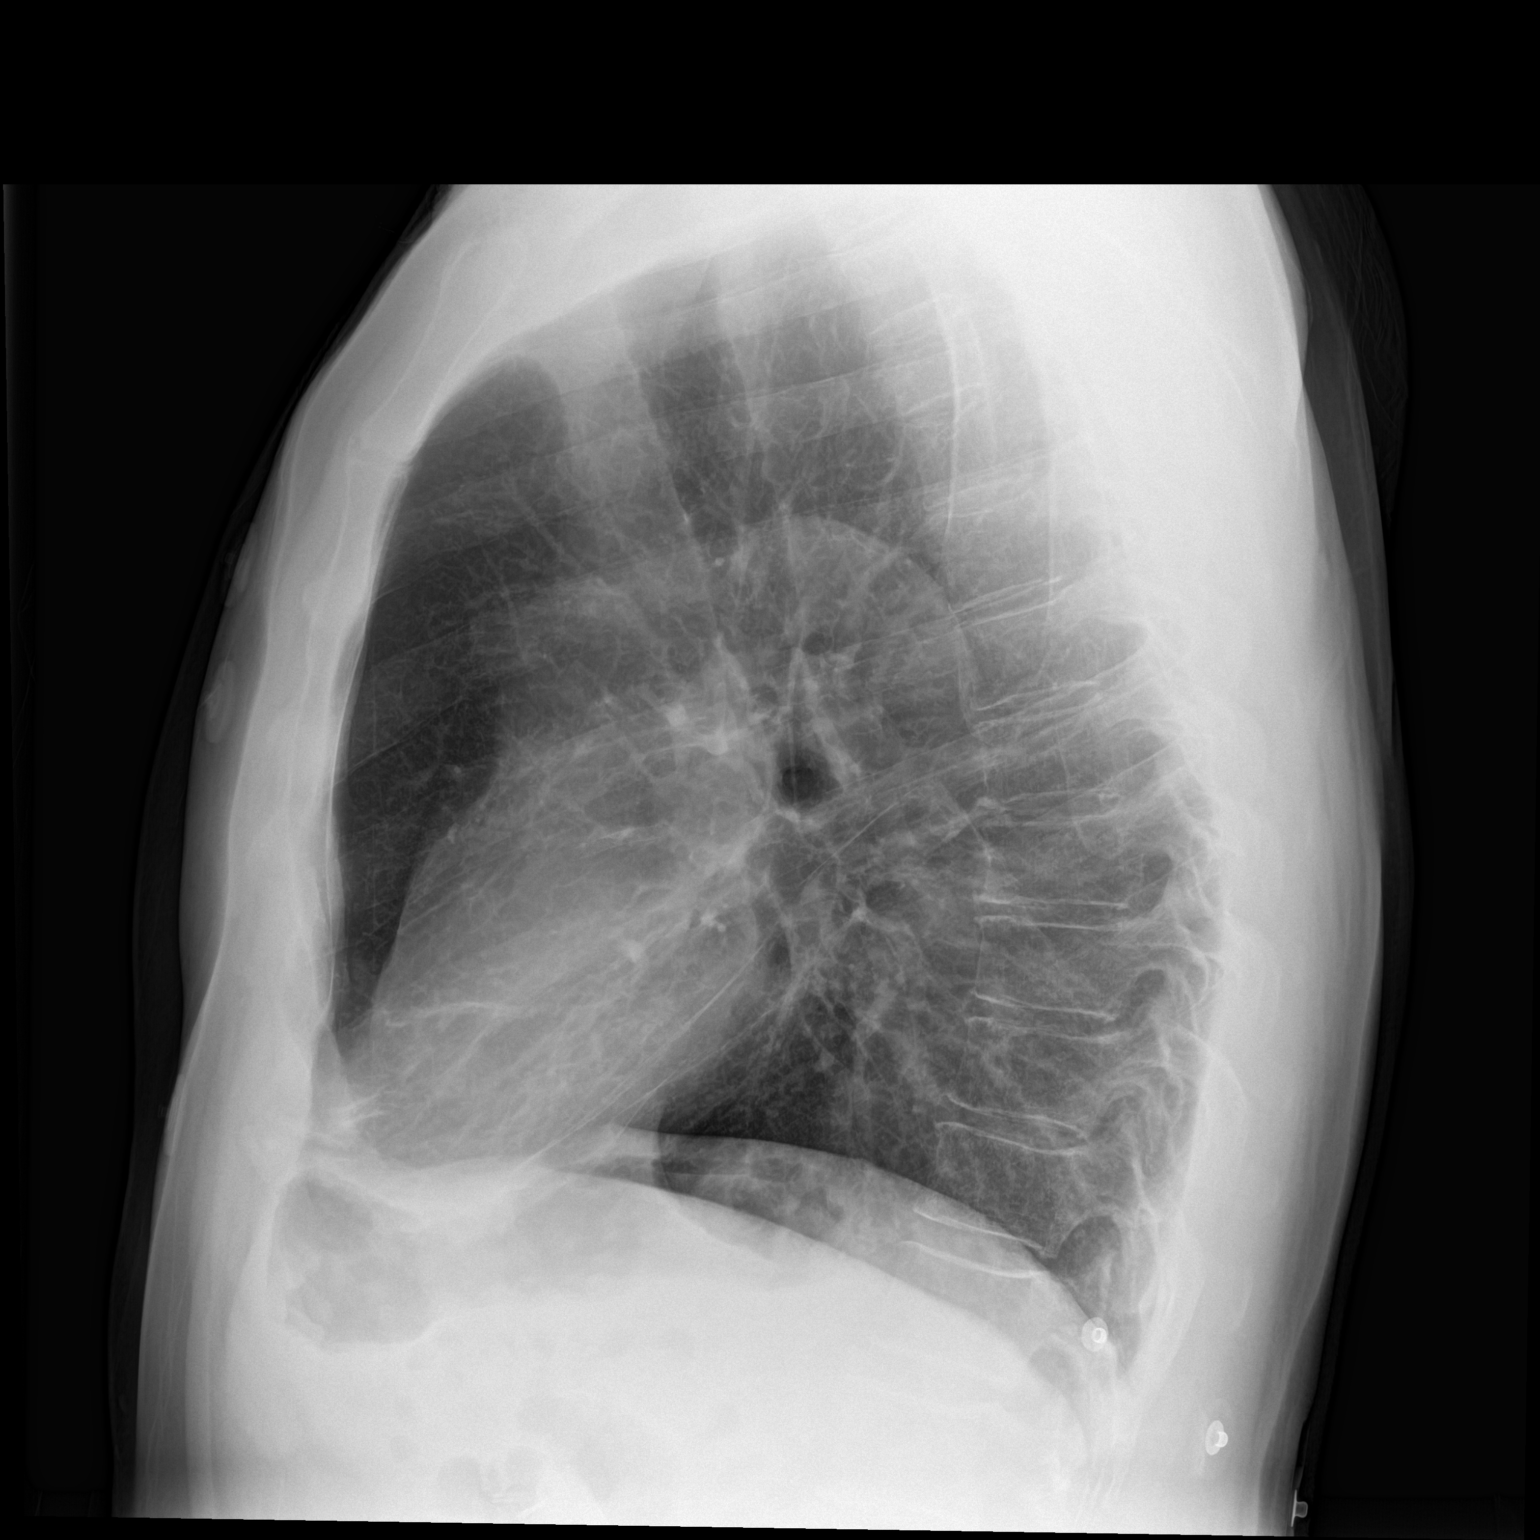

[2 of 2 positions shown; findings below may reference images not displayed]

FINDINGS: The heart size and mediastinal contours are within normal limits.
Both lungs are clear. The visualized skeletal structures are
unremarkable.
IMPRESSION: No active cardiopulmonary disease.

## 2016-06-30 DIAGNOSIS — I25810 Atherosclerosis of coronary artery bypass graft(s) without angina pectoris: Secondary | ICD-10-CM | POA: Insufficient documentation

## 2016-10-09 ENCOUNTER — Encounter: Payer: Self-pay | Admitting: Emergency Medicine

## 2016-10-09 ENCOUNTER — Emergency Department
Admission: EM | Admit: 2016-10-09 | Discharge: 2016-10-09 | Disposition: A | Discharge status: Home / Self Care | Payer: Medicaid Other | Attending: Emergency Medicine | Admitting: Emergency Medicine

## 2016-10-09 DIAGNOSIS — Z951 Presence of aortocoronary bypass graft: Secondary | ICD-10-CM | POA: Diagnosis not present

## 2016-10-09 DIAGNOSIS — M62830 Muscle spasm of back: Secondary | ICD-10-CM | POA: Diagnosis not present

## 2016-10-09 DIAGNOSIS — J449 Chronic obstructive pulmonary disease, unspecified: Secondary | ICD-10-CM | POA: Diagnosis not present

## 2016-10-09 DIAGNOSIS — Z7982 Long term (current) use of aspirin: Secondary | ICD-10-CM | POA: Insufficient documentation

## 2016-10-09 DIAGNOSIS — Z7901 Long term (current) use of anticoagulants: Secondary | ICD-10-CM | POA: Diagnosis not present

## 2016-10-09 DIAGNOSIS — F17200 Nicotine dependence, unspecified, uncomplicated: Secondary | ICD-10-CM | POA: Diagnosis not present

## 2016-10-09 DIAGNOSIS — Z79899 Other long term (current) drug therapy: Secondary | ICD-10-CM | POA: Diagnosis not present

## 2016-10-09 DIAGNOSIS — M545 Low back pain, unspecified: Secondary | ICD-10-CM

## 2016-10-09 DIAGNOSIS — G8929 Other chronic pain: Secondary | ICD-10-CM

## 2016-10-09 MED ORDER — DEXAMETHASONE SODIUM PHOSPHATE 10 MG/ML IJ SOLN
10.0000 mg | Freq: Once | INTRAMUSCULAR | Status: AC
  Administered 2016-10-09: 10 mg via INTRAMUSCULAR
  Filled 2016-10-09: qty 1

## 2016-10-09 MED ORDER — DIAZEPAM 5 MG PO TABS
5.0000 mg | ORAL_TABLET | Freq: Once | ORAL | Status: AC
  Administered 2016-10-09: 5 mg via ORAL
  Filled 2016-10-09: qty 1

## 2016-10-09 MED ORDER — FENTANYL CITRATE (PF) 100 MCG/2ML IJ SOLN
50.0000 ug | Freq: Once | INTRAMUSCULAR | Status: AC
  Administered 2016-10-09: 50 ug via INTRAMUSCULAR
  Filled 2016-10-09: qty 2

## 2016-10-09 MED ORDER — OXYCODONE-ACETAMINOPHEN 5-325 MG PO TABS: 1.0000 | ORAL_TABLET | Freq: Three times a day (TID) | ORAL | 0 refills | Status: DC | PRN

## 2016-10-09 MED ORDER — DIAZEPAM 5 MG PO TABS: 5.0000 mg | ORAL_TABLET | Freq: Four times a day (QID) | ORAL | 0 refills | Status: AC | PRN

## 2016-10-09 MED ORDER — TRIAMCINOLONE ACETONIDE 40 MG/ML IJ SUSP
80.0000 mg | Freq: Once | INTRAMUSCULAR | Status: AC
  Administered 2016-10-09: 80 mg via INTRAMUSCULAR
  Filled 2016-10-09: qty 2

## 2016-10-09 MED ORDER — PREDNISONE 10 MG (21) PO TBPK: ORAL_TABLET | ORAL | 0 refills | Status: DC

## 2016-10-09 MED ORDER — LIDOCAINE HCL (PF) 1 % IJ SOLN
10.0000 mL | Freq: Once | INTRAMUSCULAR | Status: AC
  Administered 2016-10-09: 10 mL
  Filled 2016-10-09: qty 10

## 2016-10-09 NOTE — ED Triage Notes (Signed)
States back spasms x 9 days - on methocarbamol, gabapentin for chronic pain and states not working. Has appt on Monday but states can't wait

## 2016-10-09 NOTE — Discharge Instructions (Signed)
Take medication as prescribed. Return to emergency department if symptoms worsen and follow-up with PCP as needed.   °

## 2016-10-09 NOTE — ED Provider Notes (Signed)
[name] Emergency Department Provider Note   ____________________________________________   I have reviewed the triage vital signs and the nursing notes.   HISTORY  Chief Complaint Back Pain    HPI Bryce Haney is a 66 y.o. male resents to the emergency department with mid to lumbar back pain with severe spasms that have persisted for approximately 7-10 days per patient. Over the last 24 hours spasms have significantly worsened affecting mobility and the ability to find a comfortable position. Patient denies recent injury or fall related to current symptoms. Patient has a long history of chronic back pain without any history of spine surgery. 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 Patient denies any bowel or bladder dysfunction, radiculopathy or saddle anesthesia. Patient was seen at [facility] for the same symptoms yesterday he was prescribed methocarbamol 500 mg every 8 hours. Patient reports taking (8)-500 mg tablets yesterday without relief of symptoms. He reports having severe diarrhea after taking the medication. Patient reports taking one tablet this morning at 7:30 otherwise he states he has not taken any of the methocarbamol today. Patient is taken to prescribed gabapentin, 300 mg, as directed. Patient denies fever, chills, headache, vision changes, chest pain, chest tightness, shortness of breath, abdominal pain, nausea and vomiting.  Past Medical History:  Diagnosis Date  . Atrial fibrillation ([place])   . Back pain   . COPD (chronic obstructive pulmonary disease) ([place])   . Hyperlipidemia   . Myocardial infarction ([place])   . S/P CABG (coronary artery bypass graft)   . Thyroid disease     Patient Active Problem List   Diagnosis Date Noted  . Pneumonia 06/19/2015    Past Surgical History:  Procedure Laterality Date  . CARDIAC CATHETERIZATION N/A 07/24/2014   Procedure: Left Heart Cath;  Surgeon: Corey Skains, MD;   Location: [facility];  Service: Cardiovascular;  Laterality: N/A;  . CORONARY ANGIOPLASTY    . CORONARY ANGIOPLASTY WITH STENT PLACEMENT    . CORONARY ARTERY BYPASS GRAFT      Prior to Admission medications   Medication Sig Start Date End Date Taking? Authorizing Provider  amiodarone (PACERONE) 200 MG tablet Take 200 mg by mouth daily.    [provider]  aspirin 81 MG tablet Take 81 mg by mouth daily.    [provider]  atorvastatin (LIPITOR) 40 MG tablet Take 40 mg by mouth daily.    [provider]  cyclobenzaprine (FLEXERIL) 10 MG tablet Take 10 mg by mouth at bedtime as needed for muscle spasms.    [provider]  diazepam (VALIUM) 5 MG tablet Take 1 tablet (5 mg total) by mouth every 6 (six) hours as needed for muscle spasms. 10/09/16 10/09/17  Zykeem Bauserman M, PA-C  furosemide (LASIX) 20 MG tablet Take 20 mg by mouth 2 (two) times daily.    [provider]  gabapentin (NEURONTIN) 300 MG capsule Take 300 mg by mouth at bedtime.     [provider]  levofloxacin (LEVAQUIN) 750 MG tablet Take 1 tablet (750 mg total) by mouth daily. 06/21/15   Henreitta Leber, MD  levothyroxine (SYNTHROID, LEVOTHROID) 150 MCG tablet Take 150 mcg by mouth daily before breakfast.    [provider]  metoprolol tartrate (LOPRESSOR) 25 MG tablet Take 12.5 mg by mouth 2 (two) times daily.    [provider]  mirtazapine (REMERON) 30 MG tablet Take 30 mg by mouth at bedtime.    [provider]  oxyCODONE-acetaminophen (ROXICET) 5-325 MG tablet Take 1 tablet by mouth every 8 (eight) hours as needed. 10/09/16 10/09/17  Thornton Dohrmann M, PA-C  potassium chloride SA (K-DUR,KLOR-CON) 20 MEQ tablet Take 20 mEq by mouth daily.    [provider]  predniSONE (STERAPRED UNI-PAK 21 TAB) 10 MG (21) TBPK tablet Take 6 tablets on day 1. Take 5 tablets on day 2. Take 4 tablets on day 3. Take 3 tablets on day 4. Take 2 tablets on day 5.  Take 1 tablets on day 6. 10/09/16   Angelette Ganus M, PA-C  ranitidine (ZANTAC) 150 MG tablet Take 150 mg by mouth 2 (two) times daily.    [provider]  warfarin (COUMADIN) 2 MG tablet Take 2 mg by mouth daily.    [provider]    Allergies Morphine and related  Family History  Problem Relation Age of Onset  . Heart disease Mother   . Heart disease Father     Social History Social History  Substance Use Topics  . Smoking status: Current Every Day Smoker    Packs/day: 1.00    Years: 35.00  . Smokeless tobacco: Never Used  . Alcohol use No    Review of Systems Constitutional: Negative for fever/chills Eyes: No visual changes. ENT:  Negative for sore throat and for difficulty swallowing Cardiovascular: Denies chest pain. Respiratory: Denies cough. Denies shortness of breath. Gastrointestinal: No abdominal pain.  No nausea, vomiting, diarrhea. Genitourinary: Negative for dysuria. Musculoskeletal: Positive for thoracic and lumbar pain with severe muscle spasms.  Skin: Negative for rash. Neurological: Negative for headaches. Able to ambulate. ____________________________________________   PHYSICAL EXAM:  VITAL SIGNS: ED Triage Vitals  Enc Vitals Group     BP 10/09/16 1730 135/88     Pulse Rate 10/09/16 1730 73     Resp 10/09/16 1730 16     Temp 10/09/16 1730 98.2 F (36.8 C)     Temp src --      SpO2 10/09/16 1730 93 %     Weight 10/09/16 1730 210 lb (95.3 kg)     Height 10/09/16 1730 6' (1.829 m)     Head Circumference --      Peak Flow --      Pain Score 10/09/16 1727 10     Pain Loc --      Pain Edu? --      Excl. in [place]? --     Constitutional: Alert and oriented. Well appearing and in no acute distress.  Eyes: Conjunctivae are normal. PERRL. EOMI  Head: Normocephalic and atraumatic. ENT:      Ears: Canals clear. TMs intact bilaterally.      Nose: No congestion/rhinnorhea.      Mouth/Throat: Mucous membranes are moist. Neck:Supple. No  thyromegaly. No stridor.  Cardiovascular: Normal rate, regular rhythm. Normal S1 and S2.  Good peripheral circulation. Respiratory: Normal respiratory effort without tachypnea or retractions. Lungs CTAB. Good air entry to the bases with no decreased or absent breath sounds. Hematological/Lymphatic/Immunological: No cervical lymphadenopathy. Cardiovascular: Normal rate, regular rhythm. Normal distal pulses. Respiratory: Normal respiratory effort. No wheezes/rales/rhonchi. Lungs CTAB with no W/R/R. Gastrointestinal: Bowel sounds 4 quadrants. Soft and nontender to palpation. No guarding or rigidity. No palpable masses. No distention. No CVA tenderness. Musculoskeletal: Thoracic and lumbar pain with palpable paraspinal muscle tenderness. Palpable spasms. Antalgic posturing secondary to pain and spasms. Nontender with normal range of motion in all extremities. Neurologic: Normal speech and language. No gross focal neurologic deficits  are appreciated. No gait instability. No sensory loss or abnormal reflexes.  Skin:  Skin is warm, dry and intact. No rash noted. Psychiatric: Mood and affect are normal. Speech and behavior are normal. Patient exhibits appropriate insight and judgement.  ____________________________________________   LABS (all labs ordered are listed, but only abnormal results are displayed)  Labs Reviewed - No data to display ____________________________________________  EKG none ____________________________________________  RADIOLOGY none ____________________________________________   PROCEDURES  Procedure(s) performed: Trigger point injections:  Performed by: Jerolyn Shin Consent: Verbal consent obtained. Risks and benefits: risks, benefits and alternatives were discussed  Kenalog 40 mg/ml and Lidocaine 2% 5 ml: 6 ml total on each side of thoracic longissimus. (3) trigger point injections along each side chosen based on palpation of trigger points.   Patient  tolerance: Patient tolerated the procedure well with no immediate complications.     Critical Care performed: no ____________________________________________   INITIAL IMPRESSION / ASSESSMENT AND PLAN / ED COURSE  Pertinent labs & imaging results that were available during my care of the patient were reviewed by me and considered in my medical decision making (see chart for details).  Patient presents to emergency department with lower thoracic and lumbar pain with severe muscle spasms without traumatic injury.  History and physical exam findings are reassuring symptoms are consistent with thoracic and lumbar chronic pain with muscle spasms.. Patient noted improvement of symptoms and improved ease of mobility following treatment provided during the course of care in the emergency department. Patient will be prescribed  prednisone taper, short course of Valium muscle spasms and short course of Percocet for severe pain. Patient highly encouraged to avoid narcotics for current symptoms. Patient and daughter present verbalized understanding of the importance of focusing on prednisone taper and muscle relaxers to address current symptoms. Patient advised to follow up with PCP as needed or return to the emergency department if symptoms return or worsen.    If controlled substance prescribed during this visit, 12 month history viewed on the [place] prior to issuing an initial prescription for Schedule II or III opiod. ____________________________________________   FINAL CLINICAL IMPRESSION(S) / ED DIAGNOSES  Final diagnoses:  Chronic midline low back pain without sciatica  Muscle spasm of back       NEW MEDICATIONS STARTED DURING THIS VISIT:  Discharge Medication List as of 10/09/2016  8:50 PM    START taking these medications   Details  diazepam (VALIUM) 5 MG tablet Take 1 tablet (5 mg total) by mouth every 6 (six) hours as needed for muscle spasms., Starting Sat 10/09/2016, Until Sun  10/09/2017, Print    oxyCODONE-acetaminophen (ROXICET) 5-325 MG tablet Take 1 tablet by mouth every 8 (eight) hours as needed., Starting Sat 10/09/2016, Until Sun 10/09/2017, Print    predniSONE (STERAPRED UNI-PAK 21 TAB) 10 MG (21) TBPK tablet Take 6 tablets on day 1. Take 5 tablets on day 2. Take 4 tablets on day 3. Take 3 tablets on day 4. Take 2 tablets on day 5. Take 1 tablets on day 6., Print         Note:  This document was prepared using Dragon voice recognition software and may include unintentional dictation errors.    Jerolyn Shin, PA-C 10/09/16 2321    Nance Pear, MD 10/10/16 [phone]

## 2016-10-19 ENCOUNTER — Inpatient Hospital Stay
Admission: EM | Admit: 2016-10-19 | Discharge: 2016-10-21 | DRG: 378 | Disposition: A | Discharge status: Home / Self Care | Payer: Medicaid Other | Attending: Internal Medicine | Admitting: Internal Medicine

## 2016-10-19 ENCOUNTER — Emergency Department: Payer: Medicaid Other

## 2016-10-19 DIAGNOSIS — E860 Dehydration: Secondary | ICD-10-CM | POA: Diagnosis present

## 2016-10-19 DIAGNOSIS — E785 Hyperlipidemia, unspecified: Secondary | ICD-10-CM | POA: Diagnosis present

## 2016-10-19 DIAGNOSIS — K5733 Diverticulitis of large intestine without perforation or abscess with bleeding: Secondary | ICD-10-CM | POA: Diagnosis not present

## 2016-10-19 DIAGNOSIS — N289 Disorder of kidney and ureter, unspecified: Secondary | ICD-10-CM | POA: Diagnosis present

## 2016-10-19 DIAGNOSIS — Z955 Presence of coronary angioplasty implant and graft: Secondary | ICD-10-CM

## 2016-10-19 DIAGNOSIS — K922 Gastrointestinal hemorrhage, unspecified: Secondary | ICD-10-CM | POA: Diagnosis not present

## 2016-10-19 DIAGNOSIS — I252 Old myocardial infarction: Secondary | ICD-10-CM

## 2016-10-19 DIAGNOSIS — Z7982 Long term (current) use of aspirin: Secondary | ICD-10-CM | POA: Diagnosis not present

## 2016-10-19 DIAGNOSIS — I4891 Unspecified atrial fibrillation: Secondary | ICD-10-CM | POA: Diagnosis present

## 2016-10-19 DIAGNOSIS — Z8249 Family history of ischemic heart disease and other diseases of the circulatory system: Secondary | ICD-10-CM | POA: Diagnosis not present

## 2016-10-19 DIAGNOSIS — J449 Chronic obstructive pulmonary disease, unspecified: Secondary | ICD-10-CM | POA: Diagnosis present

## 2016-10-19 DIAGNOSIS — D72829 Elevated white blood cell count, unspecified: Secondary | ICD-10-CM

## 2016-10-19 DIAGNOSIS — F17210 Nicotine dependence, cigarettes, uncomplicated: Secondary | ICD-10-CM | POA: Diagnosis present

## 2016-10-19 DIAGNOSIS — I2510 Atherosclerotic heart disease of native coronary artery without angina pectoris: Secondary | ICD-10-CM | POA: Diagnosis present

## 2016-10-19 DIAGNOSIS — K5732 Diverticulitis of large intestine without perforation or abscess without bleeding: Secondary | ICD-10-CM

## 2016-10-19 DIAGNOSIS — Z951 Presence of aortocoronary bypass graft: Secondary | ICD-10-CM

## 2016-10-19 DIAGNOSIS — E871 Hypo-osmolality and hyponatremia: Secondary | ICD-10-CM | POA: Diagnosis present

## 2016-10-19 DIAGNOSIS — Z9049 Acquired absence of other specified parts of digestive tract: Secondary | ICD-10-CM

## 2016-10-19 DIAGNOSIS — K5792 Diverticulitis of intestine, part unspecified, without perforation or abscess without bleeding: Secondary | ICD-10-CM

## 2016-10-19 DIAGNOSIS — I110 Hypertensive heart disease with heart failure: Secondary | ICD-10-CM | POA: Diagnosis present

## 2016-10-19 DIAGNOSIS — Z7901 Long term (current) use of anticoagulants: Secondary | ICD-10-CM

## 2016-10-19 DIAGNOSIS — K921 Melena: Secondary | ICD-10-CM | POA: Diagnosis not present

## 2016-10-19 DIAGNOSIS — I509 Heart failure, unspecified: Secondary | ICD-10-CM | POA: Diagnosis present

## 2016-10-19 HISTORY — DX: Heart failure, unspecified: I50.9

## 2016-10-19 LAB — COMPREHENSIVE METABOLIC PANEL
ALT: 20 U/L (ref 17–63)
AST: 17 U/L (ref 15–41)
Albumin: 4.3 g/dL (ref 3.5–5.0)
Alkaline Phosphatase: 75 U/L (ref 38–126)
Anion gap: 10 (ref 5–15)
BUN: 29 mg/dL — ABNORMAL HIGH (ref 6–20)
CALCIUM: 9.1 mg/dL (ref 8.9–10.3)
CHLORIDE: 106 mmol/L (ref 101–111)
CO2: 18 mmol/L — AB (ref 22–32)
CREATININE: 1.25 mg/dL — AB (ref 0.61–1.24)
GFR calc Af Amer: >60 mL/min (ref >60)
GFR, EST NON AFRICAN AMERICAN: 60 mL/min — AB (ref >60)
Glucose, Bld: 97 mg/dL (ref 65–99)
Potassium: 3.9 mmol/L (ref 3.5–5.1)
Sodium: 134 mmol/L — ABNORMAL LOW (ref 135–145)
Total Bilirubin: 1 mg/dL (ref 0.3–1.2)
Total Protein: 7.3 g/dL (ref 6.5–8.1)

## 2016-10-19 LAB — C DIFFICILE QUICK SCREEN W PCR REFLEX
C DIFFICILE (CDIFF) INTERP: NOT DETECTED
C DIFFICILE (CDIFF) TOXIN: NEGATIVE
C Diff antigen: NEGATIVE

## 2016-10-19 LAB — CBC
HCT: 46.6 % (ref 40.0–52.0)
HEMOGLOBIN: 15.8 g/dL (ref 13.0–18.0)
MCH: 30.7 pg (ref 26.0–34.0)
MCHC: 33.9 g/dL (ref 32.0–36.0)
MCV: 90.6 fL (ref 80.0–100.0)
PLATELETS: 268 10*3/uL (ref 150–440)
RBC: 5.14 MIL/uL (ref 4.40–5.90)
RDW: 14.6 % — ABNORMAL HIGH (ref 11.5–14.5)
WBC: 23.9 10*3/uL — AB (ref 3.8–10.6)

## 2016-10-19 LAB — TYPE AND SCREEN
ABO/RH(D): A POS
Antibody Screen: NEGATIVE

## 2016-10-19 LAB — PROTIME-INR
INR: 0.91
Prothrombin Time: 12.2 seconds (ref 11.4–15.2)

## 2016-10-19 LAB — HEMOGLOBIN: Hemoglobin: 15.7 g/dL (ref 13.0–18.0)

## 2016-10-19 LAB — TROPONIN I: TROPONIN I: 0.03 ng/mL — AB (ref <0.03)

## 2016-10-19 MED ORDER — PANTOPRAZOLE SODIUM 40 MG PO TBEC
40.0000 mg | DELAYED_RELEASE_TABLET | Freq: Two times a day (BID) | ORAL | Status: DC
  Administered 2016-10-19: 40 mg via ORAL
  Filled 2016-10-19: qty 1

## 2016-10-19 MED ORDER — ACETAMINOPHEN 325 MG PO TABS
650.0000 mg | ORAL_TABLET | Freq: Four times a day (QID) | ORAL | Status: DC | PRN
  Administered 2016-10-21: 650 mg via ORAL
  Filled 2016-10-19: qty 2

## 2016-10-19 MED ORDER — IOPAMIDOL (ISOVUE-300) INJECTION 61%
100.0000 mL | Freq: Once | INTRAVENOUS | Status: AC | PRN
  Administered 2016-10-19: 100 mL via INTRAVENOUS

## 2016-10-19 MED ORDER — GABAPENTIN 300 MG PO CAPS
300.0000 mg | ORAL_CAPSULE | Freq: Every day | ORAL | Status: DC
  Administered 2016-10-19 – 2016-10-20 (×2): 300 mg via ORAL
  Filled 2016-10-19 (×2): qty 1

## 2016-10-19 MED ORDER — ONDANSETRON HCL 4 MG/2ML IJ SOLN
4.0000 mg | Freq: Four times a day (QID) | INTRAMUSCULAR | Status: DC | PRN
  Administered 2016-10-20: 4 mg via INTRAVENOUS
  Filled 2016-10-19: qty 2

## 2016-10-19 MED ORDER — METOPROLOL TARTRATE 25 MG PO TABS
12.5000 mg | ORAL_TABLET | Freq: Two times a day (BID) | ORAL | Status: DC
  Administered 2016-10-19 – 2016-10-21 (×3): 12.5 mg via ORAL
  Filled 2016-10-19 (×5): qty 1

## 2016-10-19 MED ORDER — MIRTAZAPINE 15 MG PO TABS
30.0000 mg | ORAL_TABLET | Freq: Every day | ORAL | Status: DC
  Administered 2016-10-19 – 2016-10-20 (×2): 30 mg via ORAL
  Filled 2016-10-19 (×2): qty 2

## 2016-10-19 MED ORDER — FENTANYL CITRATE (PF) 100 MCG/2ML IJ SOLN
12.5000 ug | INTRAMUSCULAR | Status: DC | PRN
  Administered 2016-10-19 – 2016-10-20 (×4): 12.5 ug via INTRAVENOUS
  Filled 2016-10-19 (×4): qty 2

## 2016-10-19 MED ORDER — NICOTINE 21 MG/24HR TD PT24
21.0000 mg | MEDICATED_PATCH | Freq: Every day | TRANSDERMAL | Status: DC
Start: 2016-10-19 — End: 2016-10-21
  Administered 2016-10-19 – 2016-10-21 (×3): 21 mg via TRANSDERMAL
  Filled 2016-10-19 (×3): qty 1

## 2016-10-19 MED ORDER — PIPERACILLIN-TAZOBACTAM 3.375 G IVPB
3.3750 g | Freq: Three times a day (TID) | INTRAVENOUS | Status: DC
  Administered 2016-10-20 – 2016-10-21 (×4): 3.375 g via INTRAVENOUS
  Filled 2016-10-19 (×5): qty 50

## 2016-10-19 MED ORDER — ONDANSETRON HCL 4 MG/2ML IJ SOLN
4.0000 mg | Freq: Once | INTRAMUSCULAR | Status: AC
  Administered 2016-10-19: 4 mg via INTRAVENOUS
  Filled 2016-10-19: qty 2

## 2016-10-19 MED ORDER — LEVOTHYROXINE SODIUM 50 MCG PO TABS
150.0000 ug | ORAL_TABLET | Freq: Every day | ORAL | Status: DC
  Administered 2016-10-20 – 2016-10-21 (×2): 150 ug via ORAL
  Filled 2016-10-19 (×2): qty 1

## 2016-10-19 MED ORDER — SODIUM CHLORIDE 0.9% FLUSH
3.0000 mL | Freq: Two times a day (BID) | INTRAVENOUS | Status: DC
  Administered 2016-10-19 – 2016-10-21 (×3): 3 mL via INTRAVENOUS

## 2016-10-19 MED ORDER — PIPERACILLIN-TAZOBACTAM 3.375 G IVPB 30 MIN
3.3750 g | Freq: Once | INTRAVENOUS | Status: AC
  Administered 2016-10-19: 3.375 g via INTRAVENOUS

## 2016-10-19 MED ORDER — METHOCARBAMOL 500 MG PO TABS
500.0000 mg | ORAL_TABLET | Freq: Three times a day (TID) | ORAL | Status: DC | PRN
  Administered 2016-10-21: 500 mg via ORAL
  Filled 2016-10-19 (×2): qty 1

## 2016-10-19 MED ORDER — ATORVASTATIN CALCIUM 20 MG PO TABS
40.0000 mg | ORAL_TABLET | Freq: Every day | ORAL | Status: DC
  Administered 2016-10-20 – 2016-10-21 (×2): 40 mg via ORAL
  Filled 2016-10-19 (×2): qty 2

## 2016-10-19 MED ORDER — FENTANYL CITRATE (PF) 100 MCG/2ML IJ SOLN
50.0000 ug | Freq: Once | INTRAMUSCULAR | Status: AC
  Administered 2016-10-19: 50 ug via INTRAVENOUS
  Filled 2016-10-19: qty 2

## 2016-10-19 MED ORDER — PIPERACILLIN-TAZOBACTAM 3.375 G IVPB 30 MIN
INTRAVENOUS | Status: AC
  Filled 2016-10-19: qty 50

## 2016-10-19 MED ORDER — SODIUM CHLORIDE 0.9 % IV SOLN: 250.0000 mL | INTRAVENOUS | Status: DC | PRN

## 2016-10-19 MED ORDER — ONDANSETRON HCL 4 MG PO TABS: 4.0000 mg | ORAL_TABLET | Freq: Four times a day (QID) | ORAL | Status: DC | PRN

## 2016-10-19 MED ORDER — POTASSIUM CHLORIDE IN NACL 20-0.9 MEQ/L-% IV SOLN
INTRAVENOUS | Status: DC
  Administered 2016-10-19: 21:00:00 via INTRAVENOUS
  Filled 2016-10-19 (×5): qty 1000

## 2016-10-19 MED ORDER — ACETAMINOPHEN 650 MG RE SUPP: 650.0000 mg | Freq: Four times a day (QID) | RECTAL | Status: DC | PRN

## 2016-10-19 MED ORDER — SODIUM CHLORIDE 0.9% FLUSH: 3.0000 mL | INTRAVENOUS | Status: DC | PRN

## 2016-10-19 MED ORDER — SODIUM CHLORIDE 0.9 % IV BOLUS (SEPSIS)
500.0000 mL | Freq: Once | INTRAVENOUS | Status: AC
  Administered 2016-10-19: 500 mL via INTRAVENOUS

## 2016-10-19 MED ORDER — FAMOTIDINE 20 MG PO TABS
20.0000 mg | ORAL_TABLET | Freq: Every day | ORAL | Status: DC
  Administered 2016-10-19 – 2016-10-21 (×3): 20 mg via ORAL
  Filled 2016-10-19 (×3): qty 1

## 2016-10-19 NOTE — H&P (Signed)
[facility] NAME: Bryce Haney    MR#:  [id number]  DATE OF BIRTH:  Jul 16, 1950  DATE OF ADMISSION:  10/19/2016  PRIMARY CARE PHYSICIAN: Letta Median, MD   REQUESTING/REFERRING PHYSICIAN:   CHIEF COMPLAINT:   Chief Complaint  Patient presents with  . Rectal Bleeding    HISTORY OF PRESENT ILLNESS: Bryce Haney  is a 66 y.o. male with a known history of Coronary artery disease, COPD, ongoing tobacco abuse, CHF, atrial fibrillation, hyperlipidemia, coronary artery bypass grafting, who presents to the hospital with complaints of rectal bleeding, lower abdominal pain. According to the patient, he was doing well up until approximately 12:15 PM today when he started having severe abdominal pain in the lower part of abdomen, cramping, and the rectal bleeding with blood clots and bright red blood. He also admitted of diarrhea. He had at least 20 or 25 episodes of rectal bleeding and diarrhea over the past 5 hours. On arrival to the hospital, his hemoglobin level was found to be stable, hospitalist services were contacted for admission. CT scan of abdomen and pelvis revealed: Inflammation and descending colon and sigmoid, concerning for diverticulitis. Patient's labs revealed elevated white blood cell count.  PAST MEDICAL HISTORY:   Past Medical History:  Diagnosis Date  . Atrial fibrillation ([place])   . Back pain   . CHF (congestive heart failure) ([place])   . COPD (chronic obstructive pulmonary disease) ([place])   . Hyperlipidemia   . Myocardial infarction ([place])   . S/P CABG (coronary artery bypass graft)   . Thyroid disease     PAST SURGICAL HISTORY: Past Surgical History:  Procedure Laterality Date  . CARDIAC CATHETERIZATION N/A 07/24/2014   Procedure: Left Heart Cath;  Surgeon: Corey Skains, MD;  Location: [facility];  Service: Cardiovascular;  Laterality: N/A;  . CORONARY ANGIOPLASTY    . CORONARY  ANGIOPLASTY WITH STENT PLACEMENT    . CORONARY ARTERY BYPASS GRAFT      SOCIAL HISTORY:  Social History  Substance Use Topics  . Smoking status: Current Every Day Smoker    Packs/day: 1.00    Years: 35.00  . Smokeless tobacco: Never Used  . Alcohol use No    FAMILY HISTORY:  Family History  Problem Relation Age of Onset  . Heart disease Mother   . Heart disease Father     DRUG ALLERGIES:  Allergies  Allergen Reactions  . Morphine And Related Hives    Review of Systems  Constitutional: Negative for chills, fever and weight loss.  HENT: Negative for congestion.   Eyes: Negative for blurred vision and double vision.  Respiratory: Negative for cough, sputum production, shortness of breath and wheezing.   Cardiovascular: Negative for chest pain, palpitations, orthopnea, leg swelling and PND.  Gastrointestinal: Positive for abdominal pain, blood in stool and diarrhea. Negative for constipation, nausea and vomiting.  Genitourinary: Negative for dysuria, frequency, hematuria and urgency.  Musculoskeletal: Negative for falls.  Neurological: Negative for dizziness, tremors, focal weakness and headaches.  Endo/Heme/Allergies: Does not bruise/bleed easily.  Psychiatric/Behavioral: Negative for depression. The patient does not have insomnia.     MEDICATIONS AT HOME:  Prior to Admission medications   Medication Sig Start Date End Date Taking? Authorizing Provider  aspirin 81 MG tablet Take 81 mg by mouth daily.   Yes [provider]  atorvastatin (LIPITOR) 40 MG tablet Take 40 mg by mouth daily.   Yes [provider]  furosemide (LASIX) 20 MG tablet Take 20 mg by mouth 2 (two) times daily.   Yes [provider]  gabapentin (NEURONTIN) 300 MG capsule Take 300 mg by mouth at bedtime.    Yes [provider]  levothyroxine (SYNTHROID, LEVOTHROID) 150 MCG tablet Take 150 mcg by mouth daily before breakfast.   Yes [provider]    methocarbamol (ROBAXIN) 500 MG tablet Take 500 mg by mouth every 8 (eight) hours as needed for muscle spasms.   Yes [provider]  metoprolol tartrate (LOPRESSOR) 25 MG tablet Take 12.5 mg by mouth 2 (two) times daily.   Yes [provider]  mirtazapine (REMERON) 30 MG tablet Take 30 mg by mouth at bedtime.   Yes [provider]  omeprazole (PRILOSEC) 20 MG capsule Take 20 mg by mouth daily.   Yes [provider]  ranitidine (ZANTAC) 150 MG tablet Take 150 mg by mouth 2 (two) times daily.   Yes [provider]  diazepam (VALIUM) 5 MG tablet Take 1 tablet (5 mg total) by mouth every 6 (six) hours as needed for muscle spasms. Patient not taking: Reported on 10/19/2016 10/09/16 10/09/17  Little, Traci M, PA-C  levofloxacin (LEVAQUIN) 750 MG tablet Take 1 tablet (750 mg total) by mouth daily. Patient not taking: Reported on 10/19/2016 06/21/15   Henreitta Leber, MD  oxyCODONE-acetaminophen (ROXICET) 5-325 MG tablet Take 1 tablet by mouth every 8 (eight) hours as needed. Patient not taking: Reported on 10/19/2016 10/09/16 10/09/17  Little, Traci M, PA-C  predniSONE (STERAPRED UNI-PAK 21 TAB) 10 MG (21) TBPK tablet Take 6 tablets on day 1. Take 5 tablets on day 2. Take 4 tablets on day 3. Take 3 tablets on day 4. Take 2 tablets on day 5. Take 1 tablets on day 6. Patient not taking: Reported on 10/19/2016 10/09/16   Little, Traci M, PA-C      PHYSICAL EXAMINATION:   VITAL SIGNS: Blood pressure (!) 143/85, pulse 72, temperature 97.6 F (36.4 C), temperature source Oral, resp. rate 16, height 6' (1.829 m), weight 102.1 kg (225 lb), SpO2 95 %.  GENERAL:  66 y.o.-year-old patient lying in the bed In mild to moderate distress due to abdominal discomfort.  EYES: Pupils equal, round, reactive to light and accommodation. No scleral icterus. Extraocular muscles intact.  HEENT: Head atraumatic, normocephalic. Oropharynx and nasopharynx clear. Dry oral mucosa NECK:  Supple,  no jugular venous distention. No thyroid enlargement, no tenderness.  LUNGS: Normal breath sounds bilaterally, no wheezing, rales,rhonchi or crepitation. No use of accessory muscles of respiration.  CARDIOVASCULAR: S1, S2 .The rhythm was regular. No murmurs, rubs, or gallops.  ABDOMEN: Soft, tender in lower abdomen, some voluntary guarding, some distention. Bowel sounds present. No organomegaly or mass. Rectal exam was done by emergency room physician was grossly bloody EXTREMITIES: No pedal edema, cyanosis, or clubbing.  NEUROLOGIC: Cranial nerves II through XII are intact. Muscle strength 5/5 in all extremities. Sensation intact. Gait not checked.  PSYCHIATRIC: The patient is alert and oriented x 3.  SKIN: No obvious rash, lesion, or ulcer.   LABORATORY PANEL:   CBC  Recent Labs Lab 10/19/16 1638  WBC 23.9*  HGB 15.8  HCT 46.6  PLT 268  MCV 90.6  MCH 30.7  MCHC 33.9  RDW 14.6*   ------------------------------------------------------------------------------------------------------------------  Chemistries   Recent Labs Lab 10/19/16 1638  NA 134*  K 3.9  CL 106  CO2 18*  GLUCOSE 97  BUN 29*  CREATININE 1.25*  CALCIUM 9.1  AST 17  ALT 20  ALKPHOS 75  BILITOT 1.0   ------------------------------------------------------------------------------------------------------------------  Cardiac Enzymes No results for input(s): TROPONINI in the last 168 hours. ------------------------------------------------------------------------------------------------------------------  RADIOLOGY: Ct Abdomen Pelvis W Contrast  Result Date: 10/19/2016 CLINICAL DATA:  Rectal pain and bleeding EXAM: CT ABDOMEN AND PELVIS WITH CONTRAST TECHNIQUE: Multidetector CT imaging of the abdomen and pelvis was performed using the standard protocol following bolus administration of intravenous contrast. CONTRAST:  138mL ISOVUE-300 IOPAMIDOL (ISOVUE-300) INJECTION 61% COMPARISON:  10/20/2012 FINDINGS:  Lower chest: Dependent atelectasis in the bases. No effusions. Heart is normal size. Hepatobiliary: No focal liver abnormality is seen. Status post cholecystectomy. No biliary dilatation. Pancreas: No focal abnormality or ductal dilatation. Spleen: No focal abnormality.  Normal size. Adrenals/Urinary Tract: No adrenal abnormality. No focal renal abnormality. No stones or hydronephrosis. Urinary bladder is unremarkable. Stomach/Bowel: Descending colonic and sigmoid diverticulosis. Subtle stranding around the mid to distal descending colon suggesting early active diverticulitis. Stomach and small bowel decompressed, unremarkable. Appendix is normal. Vascular/Lymphatic: Scattered aortic and iliac calcifications. No evidence of aneurysm or adenopathy. Reproductive: No visible focal abnormality. Other: No free fluid or free air. Musculoskeletal: No acute bony abnormality. IMPRESSION: Descending colonic and sigmoid diverticulosis. Slight inflammation around the mid to distal descending colon suggesting early active diverticulitis. Prior cholecystectomy. Aortoiliac atherosclerosis. Electronically Signed   By: Rolm Baptise M.D.   On: 10/19/2016 18:07    EKG: Orders placed or performed during the hospital encounter of 06/19/15  . ED EKG  . ED EKG  . EKG 12-Lead  . EKG 12-Lead  . EKG    IMPRESSION AND PLAN:  Active Problems:   Lower gastrointestinal bleeding   Leukocytosis   Diverticulitis large intestine   Acute renal insufficiency   Hyponatremia  #1. Lower gastrointestinal bleeding, admit patient to medical floor, follow hemoglobin level every 8 hours, transfuse patient as needed, pro time INR normal, stop aspirin #2. Acute colitis of unclear etiology, get C. difficile, initiate patient on Zosyn, changed to vancomycin orally, if test is positive , get gastroenterologist to see the patient in consultation #3. Leukocytosis, follow with therapy #4 acute renal insufficiency, initiate patient on IV  fluids, follow creatinine in the morning #5. Hyponatremia, likely due to dehydration, follow sodium. With hydration #6. Tobacco abuse. Counseling, discussed this patient for 4 minutes, nicotine replacement therapy will be initiated, he is agreeable  All the records are reviewed and case discussed with ED provider. Management plans discussed with the patient, family and they are in agreement.  CODE STATUS: Code Status History    Date Active Date Inactive Code Status Order ID Comments User Context   06/19/2015  8:36 PM 06/21/2015  6:45 PM Full Code [id number]  Henreitta Leber, MD Inpatient   07/24/2014  8:25 AM 07/24/2014  1:43 PM Full Code [id number]  Corey Skains, MD Inpatient       TOTAL TIME TAKING CARE OF THIS PATIENT: 50 minutes.    Theodoro Grist M.D on 10/19/2016 at 7:34 PM  Between 7am to 6pm - Pager - [phone] After 6pm go to [email or web address] - password [email or web address] [facility]  Office  [phone]  CC: Primary care physician; Letta Median, MD

## 2016-10-19 NOTE — ED Provider Notes (Signed)
[name]  ____________________________________________  Time seen: Approximately 5:12 PM  I have reviewed the triage vital signs and the nursing notes.   HISTORY  Chief Complaint Rectal Bleeding   HPI Bryce Haney is a 66 y.o. male with a history of the CAD status post CABG, COPD, atrial fibrillation, hypertension and hyperlipidemia who presents for evaluation of rectal bleeding and abdominal pain.patient reports that at noon today he started having severe sharp diffuse abdominal pain associated with several episodes of bright red blood per rectum including large clots and diarrhea. Patient is  No longer taking Coumadin and has been off of it for over one year. He denies prior history of rectal bleeding. He does have a history of peptic ulcer disease many years ago. He denies NSAIDs or alcohol. He has had nausea but no vomiting. No fever or chills. Patient is complaining of 10/10 pain at this time also complaining of burning sensation in his rectum.  Past Medical History:  Diagnosis Date  . Atrial fibrillation ([place])   . Back pain   . CHF (congestive heart failure) ([place])   . COPD (chronic obstructive pulmonary disease) ([place])   . Hyperlipidemia   . Myocardial infarction ([place])   . S/P CABG (coronary artery bypass graft)   . Thyroid disease     Patient Active Problem List   Diagnosis Date Noted  . Pneumonia 06/19/2015    Past Surgical History:  Procedure Laterality Date  . CARDIAC CATHETERIZATION N/A 07/24/2014   Procedure: Left Heart Cath;  Surgeon: Corey Skains, MD;  Location: [facility];  Service: Cardiovascular;  Laterality: N/A;  . CORONARY ANGIOPLASTY    . CORONARY ANGIOPLASTY WITH STENT PLACEMENT    . CORONARY ARTERY BYPASS GRAFT      Prior to Admission medications   Medication Sig Start Date End Date Taking? Authorizing Provider  amiodarone (PACERONE) 200 MG tablet Take 200 mg by mouth  daily.    [provider]  aspirin 81 MG tablet Take 81 mg by mouth daily.    [provider]  atorvastatin (LIPITOR) 40 MG tablet Take 40 mg by mouth daily.    [provider]  cyclobenzaprine (FLEXERIL) 10 MG tablet Take 10 mg by mouth at bedtime as needed for muscle spasms.    [provider]  diazepam (VALIUM) 5 MG tablet Take 1 tablet (5 mg total) by mouth every 6 (six) hours as needed for muscle spasms. 10/09/16 10/09/17  Little, Traci M, PA-C  furosemide (LASIX) 20 MG tablet Take 20 mg by mouth 2 (two) times daily.    [provider]  gabapentin (NEURONTIN) 300 MG capsule Take 300 mg by mouth at bedtime.     [provider]  levofloxacin (LEVAQUIN) 750 MG tablet Take 1 tablet (750 mg total) by mouth daily. 06/21/15   Henreitta Leber, MD  levothyroxine (SYNTHROID, LEVOTHROID) 150 MCG tablet Take 150 mcg by mouth daily before breakfast.    [provider]  metoprolol tartrate (LOPRESSOR) 25 MG tablet Take 12.5 mg by mouth 2 (two) times daily.    [provider]  mirtazapine (REMERON) 30 MG tablet Take 30 mg by mouth at bedtime.    [provider]  oxyCODONE-acetaminophen (ROXICET) 5-325 MG tablet Take 1 tablet by mouth every 8 (eight) hours as needed. 10/09/16 10/09/17  Little, Traci M, PA-C  potassium chloride SA (K-DUR,KLOR-CON) 20 MEQ tablet Take 20 mEq by mouth daily.    [provider]  predniSONE (STERAPRED UNI-PAK 21 TAB) 10 MG (21) TBPK tablet Take 6 tablets on day 1. Take 5 tablets on day 2. Take 4 tablets on day 3. Take 3 tablets on day 4. Take 2 tablets on day 5. Take 1 tablets on day 6. 10/09/16   Little, Traci M, PA-C  ranitidine (ZANTAC) 150 MG tablet Take 150 mg by mouth 2 (two) times daily.    [provider]  warfarin (COUMADIN) 2 MG tablet Take 2 mg by mouth daily.    [provider]    Allergies Morphine and related  Family History  Problem Relation Age of Onset  . Heart  disease Mother   . Heart disease Father     Social History Social History  Substance Use Topics  . Smoking status: Current Every Day Smoker    Packs/day: 1.00    Years: 35.00  . Smokeless tobacco: Never Used  . Alcohol use No    Review of Systems  Constitutional: Negative for fever. Eyes: Negative for visual changes. ENT: Negative for sore throat. Neck: No neck pain  Cardiovascular: Negative for chest pain. Respiratory: Negative for shortness of breath. Gastrointestinal: + diffuse abdominal pain, nausea, hematochezia. No vomiting  Genitourinary: Negative for dysuria. Musculoskeletal: Negative for back pain. Skin: Negative for rash. Neurological: Negative for headaches, weakness or numbness. Psych: No SI or HI  ____________________________________________   PHYSICAL EXAM:  VITAL SIGNS: ED Triage Vitals  Enc Vitals Group     BP 10/19/16 1533 127/83     Pulse Rate 10/19/16 1533 86     Resp 10/19/16 1533 16     Temp 10/19/16 1533 97.6 F (36.4 C)     Temp Source 10/19/16 1533 Oral     SpO2 10/19/16 1529 98 %     Weight 10/19/16 1530 225 lb (102.1 kg)     Height 10/19/16 1530 6' (1.829 m)     Head Circumference --      Peak Flow --      Pain Score 10/19/16 1529 10     Pain Loc --      Pain Edu? --      Excl. in [place]? --     Constitutional: Alert and oriented. Well appearing and in no apparent distress. HEENT:      Head: Normocephalic and atraumatic.         Eyes: Conjunctivae are normal. Sclera is non-icteric.       Mouth/Throat: Mucous membranes are moist.       Neck: Supple with no signs of meningismus. Cardiovascular: Regular rate and rhythm. No murmurs, gallops, or rubs. 2+ symmetrical distal pulses are present in all extremities. No JVD. Respiratory: Normal respiratory effort. Lungs are clear to auscultation bilaterally. No wheezes, crackles, or rhonchi.  Gastrointestinal: Soft, diffuse ttp worse in the L quadrants, and non distended with positive bowel  sounds. No rebound or guarding. Genitourinary: No CVA tenderness.rectal exam showing several external hemorrhoids, bloody brown stool grossly guaiac positive Musculoskeletal: Nontender with normal range of motion in all extremities. No edema, cyanosis, or erythema of extremities. Neurologic: Normal speech and language. Face is symmetric. Moving all extremities. No gross focal neurologic deficits are appreciated. Skin: Skin is warm, dry and intact. No rash noted. Psychiatric: Mood and affect are normal. Speech and behavior are normal.  ____________________________________________   LABS (all labs ordered are listed, but only abnormal results are displayed)  Labs Reviewed  CBC - Abnormal; Notable for the following:  Result Value   WBC 23.9 (*)    RDW 14.6 (*)    All other components within normal limits  COMPREHENSIVE METABOLIC PANEL - Abnormal; Notable for the following:    Sodium 134 (*)    CO2 18 (*)    BUN 29 (*)    Creatinine, Ser 1.25 (*)    GFR calc non Af Amer 60 (*)    All other components within normal limits  C DIFFICILE QUICK SCREEN W PCR REFLEX  PROTIME-INR  POC OCCULT BLOOD, ED  TYPE AND SCREEN  TYPE AND SCREEN   ____________________________________________  EKG  none ____________________________________________  RADIOLOGY  CT a/p: Descending colonic and sigmoid diverticulosis. Slight inflammation around the mid to distal descending colon suggesting early active diverticulitis.  Prior cholecystectomy.  Aortoiliac atherosclerosis.  ____________________________________________   PROCEDURES  Procedure(s) performed: None Procedures Critical Care performed:  None ____________________________________________   INITIAL IMPRESSION / ASSESSMENT AND PLAN / ED COURSE  66 y.o. male with a history of the CAD status post CABG, COPD, atrial fibrillation, hypertension and hyperlipidemia who presents for evaluation of rectal bleeding and diffuse abdominal  pain since 12 noon today. Patient with significant ttp over the L quadrants, no rebound or guarding, HD stable, labs with stable hgb. Leukocytosis with WBC 23.9. Ddx ischemic bowel, diverticular bleed, PUD, C diff colitis. Plan for IVF, CT angio, and C. Diff.    _________________________ 6:15 PM on 10/19/2016 -----------------------------------------  CT concerning for diverticulitis. Due to patient's LGIB and elevated WBC will give zosyn and admit to Hospitalist.  Pertinent labs & imaging results that were available during my care of the patient were reviewed by me and considered in my medical decision making (see chart for details).    ____________________________________________   FINAL CLINICAL IMPRESSION(S) / ED DIAGNOSES  Final diagnoses:  Lower GI bleed  Diverticulitis      NEW MEDICATIONS STARTED DURING THIS VISIT:  New Prescriptions   No medications on file     Note:  This document was prepared using Dragon voice recognition software and may include unintentional dictation errors.    Alfred Levins, [place], MD 10/19/16 [phone]

## 2016-10-19 NOTE — ED Notes (Signed)
Called the lab and advised that pt was a hard stick and tubes might hemolyze - advised we were unable to obtain blue top and request that lab come and redraw any labs that had hemolyzed as well as the blue top - they stated they would have someone come and draw blood after checking the tubes that had been sent

## 2016-10-19 NOTE — ED Triage Notes (Signed)
Pt arrived via ems for c/o rectal pain and rectal bleeding that started at noon today - pt reports that he has hx of ulcers but no history of hemorrhoids - pt also having lower abd pain that is sharp in nature with a constant ache - pt states that there is moderate clots of blood being passed when he has a BM

## 2016-10-19 NOTE — ED Notes (Signed)
Lab called and type and screen was hemolyzed - IV consult placed for IV and lab draw

## 2016-10-19 NOTE — ED Notes (Addendum)
Blood drawn by [place] RN - pt was hard stick and lab notified that tubes might hemolyze

## 2016-10-19 NOTE — ED Notes (Signed)
Patient transported to CT 

## 2016-10-19 NOTE — Progress Notes (Signed)
Pharmacy Antibiotic Note  Bryce Haney is a 66 y.o. male admitted on 10/19/2016 with acute colitis.  Pharmacy has been consulted for Zosyn dosing.  Plan: Zosyn 3.375g IV q8h (4 hour infusion).  Height: 6' (182.9 cm) Weight: 225 lb (102.1 kg) IBW/kg (Calculated) : 77.6  Temp (24hrs), Avg:97.6 F (36.4 C), Min:97.6 F (36.4 C), Max:97.6 F (36.4 C)   Recent Labs Lab 10/19/16 1638  WBC 23.9*  CREATININE 1.25*    Estimated Creatinine Clearance: 74.8 mL/min (A) (by C-G formula based on SCr of 1.25 mg/dL (H)).    Allergies  Allergen Reactions  . Morphine And Related Hives    Antimicrobials this admission: Zosyn 8/14 >>  Dose adjustments this admission:   Microbiology results: C diff sent   Thank you for allowing pharmacy to be a part of this patient's care.  Rocky Morel 10/19/2016 8:04 PM

## 2016-10-19 NOTE — ED Notes (Signed)
Pt transported to 226

## 2016-10-20 DIAGNOSIS — K5733 Diverticulitis of large intestine without perforation or abscess with bleeding: Principal | ICD-10-CM

## 2016-10-20 DIAGNOSIS — K922 Gastrointestinal hemorrhage, unspecified: Secondary | ICD-10-CM

## 2016-10-20 LAB — BASIC METABOLIC PANEL
ANION GAP: 3 — AB (ref 5–15)
BUN: 19 mg/dL (ref 6–20)
CHLORIDE: 108 mmol/L (ref 101–111)
CO2: 23 mmol/L (ref 22–32)
CREATININE: 1.03 mg/dL (ref 0.61–1.24)
Calcium: 8.5 mg/dL — ABNORMAL LOW (ref 8.9–10.3)
GFR calc Af Amer: >60 mL/min (ref >60)
GFR calc non Af Amer: >60 mL/min (ref >60)
GLUCOSE: 87 mg/dL (ref 65–99)
Potassium: 4.2 mmol/L (ref 3.5–5.1)
Sodium: 134 mmol/L — ABNORMAL LOW (ref 135–145)

## 2016-10-20 LAB — CBC
HCT: 43.2 % (ref 40.0–52.0)
Hemoglobin: 14.2 g/dL (ref 13.0–18.0)
MCH: 29.9 pg (ref 26.0–34.0)
MCHC: 33 g/dL (ref 32.0–36.0)
MCV: 90.6 fL (ref 80.0–100.0)
PLATELETS: 239 10*3/uL (ref 150–440)
RBC: 4.76 MIL/uL (ref 4.40–5.90)
RDW: 14.7 % — ABNORMAL HIGH (ref 11.5–14.5)
WBC: 13.2 10*3/uL — ABNORMAL HIGH (ref 3.8–10.6)

## 2016-10-20 LAB — HEMOGLOBIN
HEMOGLOBIN: 14.7 g/dL (ref 13.0–18.0)
Hemoglobin: 14.3 g/dL (ref 13.0–18.0)
Hemoglobin: 14.2 g/dL (ref 13.0–18.0)

## 2016-10-20 LAB — TROPONIN I
Troponin I: <0.03 ng/mL (ref <0.03)
Troponin I: <0.03 ng/mL (ref <0.03)

## 2016-10-20 LAB — GLUCOSE, CAPILLARY: Glucose-Capillary: 85 mg/dL (ref 65–99)

## 2016-10-20 MED ORDER — PANTOPRAZOLE SODIUM 40 MG PO TBEC
40.0000 mg | DELAYED_RELEASE_TABLET | Freq: Two times a day (BID) | ORAL | Status: DC
  Administered 2016-10-20 – 2016-10-21 (×3): 40 mg via ORAL
  Filled 2016-10-20 (×3): qty 1

## 2016-10-20 MED ORDER — METRONIDAZOLE 500 MG PO TABS
500.0000 mg | ORAL_TABLET | Freq: Three times a day (TID) | ORAL | Status: DC
  Administered 2016-10-20 – 2016-10-21 (×4): 500 mg via ORAL
  Filled 2016-10-20 (×5): qty 1

## 2016-10-20 MED ORDER — CIPROFLOXACIN HCL 500 MG PO TABS
500.0000 mg | ORAL_TABLET | Freq: Two times a day (BID) | ORAL | Status: DC
  Administered 2016-10-20 – 2016-10-21 (×2): 500 mg via ORAL
  Filled 2016-10-20 (×2): qty 1

## 2016-10-20 NOTE — Consult Note (Signed)
Bryce Darby, MD [address]  [place]  [place], [place] [address]  Main: [phone]  Fax: [phone] Pager: [phone]   Consultation  Referring Provider:     No ref. provider found Primary Care Physician:  Letta Median, MD Primary Gastroenterologist:  Dr. Sherri Sear         Reason for Consultation:     Lower abdominal pain, rectal bleeding  Date of Admission:  10/19/2016 Date of Consultation:  10/20/2016         HPI:   Bryce Haney is a 66 y.o. male with past medical history as detailed below presents with 1 day history of sudden onset of severe lower abdominal pain associated with 2 episodes of nonbloody diarrhea followed by several episodes of rectal bleeding. He describes the bleeding as clumps of blood with stool as well as on wiping. CT in the ER revealed Descending colonic and sigmoid diverticulosis as well as acute diverticulitis. He is started on Zosyn. Currently, he reports that pain has significantly improved, and rectal bleeding is becoming lighter. He does not have diarrhea. He is having clear liquids for lunch and he is tolerating them well. He feels hungry and would like to eat more than just liquid diet. He smokes more than pack a day cigarettes, denies alcohol. He denies any upper GI symptoms. His C. difficile came back negative. Hemoglobin dropped by 1 g since admission. He had elevated WBC count to 23K which has improved to 13 K today.  He had a colonoscopy 10 years ago at [facility] and reportedly normal. However I could not find a colonoscopy report in our system. Denies family history of colon cancer. Only had cholecystectomy. Denies any other symptoms otherwise. He thinks he can go home later today.  Past Medical History:  Diagnosis Date  . Atrial fibrillation ([place])   . Back pain   . CHF (congestive heart failure) ([place])   . COPD (chronic obstructive pulmonary disease) ([place])   . Hyperlipidemia   . Myocardial infarction ([place])   . S/P CABG  (coronary artery bypass graft)   . Thyroid disease     Past Surgical History:  Procedure Laterality Date  . CARDIAC CATHETERIZATION N/A 07/24/2014   Procedure: Left Heart Cath;  Surgeon: Corey Skains, MD;  Location: [facility];  Service: Cardiovascular;  Laterality: N/A;  . CORONARY ANGIOPLASTY    . CORONARY ANGIOPLASTY WITH STENT PLACEMENT    . CORONARY ARTERY BYPASS GRAFT      Prior to Admission medications   Medication Sig Start Date End Date Taking? Authorizing Provider  aspirin 81 MG tablet Take 81 mg by mouth daily.   Yes [provider]  atorvastatin (LIPITOR) 40 MG tablet Take 40 mg by mouth daily.   Yes [provider]  furosemide (LASIX) 20 MG tablet Take 20 mg by mouth 2 (two) times daily.   Yes [provider]  gabapentin (NEURONTIN) 300 MG capsule Take 300 mg by mouth at bedtime.    Yes [provider]  levothyroxine (SYNTHROID, LEVOTHROID) 150 MCG tablet Take 150 mcg by mouth daily before breakfast.   Yes [provider]  methocarbamol (ROBAXIN) 500 MG tablet Take 500 mg by mouth every 8 (eight) hours as needed for muscle spasms.   Yes [provider]  metoprolol tartrate (LOPRESSOR) 25 MG tablet Take 12.5 mg by mouth 2 (two) times daily.   Yes [provider]  mirtazapine (REMERON) 30 MG tablet Take 30 mg  by mouth at bedtime.   Yes [provider]  omeprazole (PRILOSEC) 20 MG capsule Take 20 mg by mouth daily.   Yes [provider]  ranitidine (ZANTAC) 150 MG tablet Take 150 mg by mouth 2 (two) times daily.   Yes [provider]  diazepam (VALIUM) 5 MG tablet Take 1 tablet (5 mg total) by mouth every 6 (six) hours as needed for muscle spasms. Patient not taking: Reported on 10/19/2016 10/09/16 10/09/17  Little, Traci M, PA-C  levofloxacin (LEVAQUIN) 750 MG tablet Take 1 tablet (750 mg total) by mouth daily. Patient not taking: Reported on 10/19/2016 06/21/15   Henreitta Leber,  MD  oxyCODONE-acetaminophen (ROXICET) 5-325 MG tablet Take 1 tablet by mouth every 8 (eight) hours as needed. Patient not taking: Reported on 10/19/2016 10/09/16 10/09/17  Little, Traci M, PA-C  predniSONE (STERAPRED UNI-PAK 21 TAB) 10 MG (21) TBPK tablet Take 6 tablets on day 1. Take 5 tablets on day 2. Take 4 tablets on day 3. Take 3 tablets on day 4. Take 2 tablets on day 5. Take 1 tablets on day 6. Patient not taking: Reported on 10/19/2016 10/09/16   Little, Laroy Apple, PA-C    Family History  Problem Relation Age of Onset  . Heart disease Mother   . Heart disease Father      Social History  Substance Use Topics  . Smoking status: Current Every Day Smoker    Packs/day: 1.00    Years: 35.00  . Smokeless tobacco: Never Used  . Alcohol use No    Allergies as of 10/19/2016 - Review Complete 10/19/2016  Allergen Reaction Noted  . Morphine and related Hives 07/24/2014    Review of Systems:    All systems reviewed and negative except where noted in HPI.   Physical Exam:  Vital signs in last 24 hours: Temp:  [97.6 F (36.4 C)-98.2 F (36.8 C)] 97.9 F (36.6 C) (08/15 0525) Pulse Rate:  [61-86] 68 (08/15 0525) Resp:  [16-20] 18 (08/15 0525) BP: (96-151)/(57-127) 106/57 (08/15 0525) SpO2:  [93 %-98 %] 93 % (08/15 0525) Weight:  [98.3 kg (216 lb 12.8 oz)-102.1 kg (225 lb)] 98.3 kg (216 lb 12.8 oz) (08/15 0500) Last BM Date: 10/20/16 General:   Pleasant, cooperative in NAD Head:  Normocephalic and atraumatic. Eyes:   No icterus.   Conjunctiva pink. PERRLA. Ears:  Normal auditory acuity. Neck:  Supple; no masses or thyroidomegaly Lungs: Respirations even and unlabored. Lungs clear to auscultation bilaterally.   No wheezes, crackles, or rhonchi.  Heart:  Regular rate and rhythm;  Without murmur, clicks, rubs or gallops Abdomen:  Soft, nondistended, mild lower abdominal tenderness. Normal bowel sounds. No appreciable masses or hepatomegaly.  No rebound or guarding.  Rectal:  Not  performed. Msk:  Symmetrical without gross deformities.  Extremities:  Without edema, cyanosis or clubbing. Neurologic:  Alert and oriented x3; Skin:  Intact without significant lesions or rashes. Psych:  Alert and cooperative. Normal affect.  LAB RESULTS:  Recent Labs  10/19/16 1638 10/19/16 2017 10/20/16 0246 10/20/16 0839  WBC 23.9*  --   --  13.2*  HGB 15.8 15.7 14.7 14.2  HCT 46.6  --   --  43.2  PLT 268  --   --  239   BMET  Recent Labs  10/19/16 1638 10/20/16 0839  NA 134* 134*  K 3.9 4.2  CL 106 108  CO2 18* 23  GLUCOSE 97 87  BUN 29* 19  CREATININE 1.25* 1.03  CALCIUM 9.1 8.5*   LFT  Recent Labs  10/19/16 1638  PROT 7.3  ALBUMIN 4.3  AST 17  ALT 20  ALKPHOS 75  BILITOT 1.0   PT/INR  Recent Labs  10/19/16 1638  LABPROT 12.2  INR 0.91    STUDIES: Ct Abdomen Pelvis W Contrast  Result Date: 10/19/2016 CLINICAL DATA:  Rectal pain and bleeding EXAM: CT ABDOMEN AND PELVIS WITH CONTRAST TECHNIQUE: Multidetector CT imaging of the abdomen and pelvis was performed using the standard protocol following bolus administration of intravenous contrast. CONTRAST:  166mL ISOVUE-300 IOPAMIDOL (ISOVUE-300) INJECTION 61% COMPARISON:  10/20/2012 FINDINGS: Lower chest: Dependent atelectasis in the bases. No effusions. Heart is normal size. Hepatobiliary: No focal liver abnormality is seen. Status post cholecystectomy. No biliary dilatation. Pancreas: No focal abnormality or ductal dilatation. Spleen: No focal abnormality.  Normal size. Adrenals/Urinary Tract: No adrenal abnormality. No focal renal abnormality. No stones or hydronephrosis. Urinary bladder is unremarkable. Stomach/Bowel: Descending colonic and sigmoid diverticulosis. Subtle stranding around the mid to distal descending colon suggesting early active diverticulitis. Stomach and small bowel decompressed, unremarkable. Appendix is normal. Vascular/Lymphatic: Scattered aortic and iliac calcifications. No evidence  of aneurysm or adenopathy. Reproductive: No visible focal abnormality. Other: No free fluid or free air. Musculoskeletal: No acute bony abnormality. IMPRESSION: Descending colonic and sigmoid diverticulosis. Slight inflammation around the mid to distal descending colon suggesting early active diverticulitis. Prior cholecystectomy. Aortoiliac atherosclerosis. Electronically Signed   By: Rolm Baptise M.D.   On: 10/19/2016 18:07      Impression / Plan:   Bryce Haney is a 66 y.o. y/o male with Coronary artery disease, A. fib presents with acute onset of one-day history of lower abdominal pain associated with diarrhea and rectal bleeding and CT suggestive of acute diverticulitis. He is not actively bleeding currently. His hemoglobin has been stable overall. He is responding to antibiotics and he symptomatically better. I would not recommend a colonoscopy in the setting of acute diverticulitis due to risk of perforation. He will follow up with me as an outpatient and schedule a colonoscopy in next 2-3 months. Okay to discharge him today on oral Cipro and Flagyl for 5 days. Please advance his diet as tolerated.   Thank you for involving me in the care of this patient.      LOS: 1 day   Sherri Sear, MD  10/20/2016, 12:02 PM   Note: This dictation was prepared with Dragon dictation along with smaller phrase technology. Any transcriptional errors that result from this process are unintentional.

## 2016-10-20 NOTE — Progress Notes (Signed)
[facility] [place] NAME: Bryce Haney    MR#:  [id number]  DATE OF BIRTH:  [birth year]  SUBJECTIVE:  CHIEF COMPLAINT:  Patient is reporting lower abdominal cramps denies any nausea or vomiting tolerating clear liquid diet, denies any profuse bleeding  REVIEW OF SYSTEMS:  CONSTITUTIONAL: No fever, fatigue or weakness.  EYES: No blurred or double vision.  EARS, NOSE, AND THROAT: No tinnitus or ear pain.  RESPIRATORY: No cough, shortness of breath, wheezing or hemoptysis.  CARDIOVASCULAR: No chest pain, orthopnea, edema.  GASTROINTESTINAL: No nausea, vomiting, diarrhea . Reporting lower abdominal cramps GENITOURINARY: No dysuria, hematuria.  ENDOCRINE: No polyuria, nocturia,  HEMATOLOGY: No anemia, easy bruising or bleeding SKIN: No rash or lesion. MUSCULOSKELETAL: No joint pain or arthritis.   NEUROLOGIC: No tingling, numbness, weakness.  PSYCHIATRY: No anxiety or depression.   DRUG ALLERGIES:   Allergies  Allergen Reactions  . Morphine And Related Hives    VITALS:  Blood pressure 110/64, pulse 72, temperature 98.1 F (36.7 C), temperature source Oral, resp. rate 19, height 6' (1.829 m), weight 98.3 kg (216 lb 12.8 oz), SpO2 94 %.  PHYSICAL EXAMINATION:  GENERAL:  66 y.o.-year-old patient lying in the bed with no acute distress.  EYES: Pupils equal, round, reactive to light and accommodation. No scleral icterus. Extraocular muscles intact.  HEENT: Head atraumatic, normocephalic. Oropharynx and nasopharynx clear.  NECK:  Supple, no jugular venous distention. No thyroid enlargement, no tenderness.  LUNGS: Normal breath sounds bilaterally, no wheezing, rales,rhonchi or crepitation. No use of accessory muscles of respiration.  CARDIOVASCULAR: S1, S2 normal. No murmurs, rubs, or gallops.  ABDOMEN: Soft,minimal lower abdominal tenderness is present no rebound tenderness , nondistended. Bowel sounds present. No organomegaly or  mass.  EXTREMITIES: No pedal edema, cyanosis, or clubbing.  NEUROLOGIC: Cranial nerves II through XII are intact. Muscle strength 5/5 in all extremities. Sensation intact. Gait not checked.  PSYCHIATRIC: The patient is alert and oriented x 3.  SKIN: No obvious rash, lesion, or ulcer.    LABORATORY PANEL:   CBC  Recent Labs Lab 10/20/16 0839 10/20/16 1147  WBC 13.2*  --   HGB 14.2 14.3  HCT 43.2  --   PLT 239  --    ------------------------------------------------------------------------------------------------------------------  Chemistries   Recent Labs Lab 10/19/16 1638 10/20/16 0839  NA 134* 134*  K 3.9 4.2  CL 106 108  CO2 18* 23  GLUCOSE 97 87  BUN 29* 19  CREATININE 1.25* 1.03  CALCIUM 9.1 8.5*  AST 17  --   ALT 20  --   ALKPHOS 75  --   BILITOT 1.0  --    ------------------------------------------------------------------------------------------------------------------  Cardiac Enzymes  Recent Labs Lab 10/20/16 0839  TROPONINI <0.03   ------------------------------------------------------------------------------------------------------------------  RADIOLOGY:  Ct Abdomen Pelvis W Contrast  Result Date: 10/19/2016 CLINICAL DATA:  Rectal pain and bleeding EXAM: CT ABDOMEN AND PELVIS WITH CONTRAST TECHNIQUE: Multidetector CT imaging of the abdomen and pelvis was performed using the standard protocol following bolus administration of intravenous contrast. CONTRAST:  149mL ISOVUE-300 IOPAMIDOL (ISOVUE-300) INJECTION 61% COMPARISON:  10/20/2012 FINDINGS: Lower chest: Dependent atelectasis in the bases. No effusions. Heart is normal size. Hepatobiliary: No focal liver abnormality is seen. Status post cholecystectomy. No biliary dilatation. Pancreas: No focal abnormality or ductal dilatation. Spleen: No focal abnormality.  Normal size. Adrenals/Urinary Tract: No adrenal abnormality. No focal renal abnormality. No stones or hydronephrosis. Urinary bladder is  unremarkable. Stomach/Bowel: Descending colonic and sigmoid diverticulosis. Subtle  stranding around the mid to distal descending colon suggesting early active diverticulitis. Stomach and small bowel decompressed, unremarkable. Appendix is normal. Vascular/Lymphatic: Scattered aortic and iliac calcifications. No evidence of aneurysm or adenopathy. Reproductive: No visible focal abnormality. Other: No free fluid or free air. Musculoskeletal: No acute bony abnormality. IMPRESSION: Descending colonic and sigmoid diverticulosis. Slight inflammation around the mid to distal descending colon suggesting early active diverticulitis. Prior cholecystectomy. Aortoiliac atherosclerosis. Electronically Signed   By: Rolm Baptise M.D.   On: 10/19/2016 18:07    EKG:   Orders placed or performed during the hospital encounter of 06/19/15  . ED EKG  . ED EKG  . EKG 12-Lead  . EKG 12-Lead  . EKG    ASSESSMENT AND PLAN:    #1. Lower gastrointestinal bleeding from acute diverticulitis Stool for C. Difficile toxin is negative Patient is on clear liquids and advance diet as tolerated Pain management as needed Monitor hemoglobin and hematocrit, Patient is hemodynamically stable at this time Patient was seen by gastroenterology who is recommending outpatient follow-up after discharge Will resume aspirin if hemoglobin is stable at the time of discharge  #2. Acute diverticulitis Discontinue IV Zosyn and change antibiotics to by mouth ciprofloxacin and Flagyl for a total of 5 days  #3. Leukocytosis, From acute diverticulitis  #4 acute renal insufficiency,Improving with IV fluids Cr 1.25-- 1.03  #5. Hyponatremia, resolved with IV fluids  #6. Tobacco abuse. Counseling Provided by the admitting physician, nicotine replacement therapy  initiated, he is agreeable    All the records are reviewed and case discussed with Care Management/Social Workerr. Management plans discussed with the patient, family and they  are in agreement.  CODE STATUS: fc   TOTAL TIME TAKING CARE OF THIS PATIENT: 36 minutes.   POSSIBLE D/C IN am DAYS, DEPENDING ON CLINICAL CONDITION.  Note: This dictation was prepared with Dragon dictation along with smaller phrase technology. Any transcriptional errors that result from this process are unintentional.   Nicholes Mango M.D on 10/20/2016 at 2:37 PM  Between 7am to 6pm - Pager - [phone] After 6pm go to [email or web address] - password [email or web address] [facility]  Office  [phone]  CC: Primary care physician; Letta Median, MD

## 2016-10-20 NOTE — Progress Notes (Signed)
No concerns overnight. Pnt has has 2 stools soft, brown in color and flecks of red. Pnt has been asleep and snores. When awake c/o of some abd pain but falls right to sleep and snores. Will continue to monitor and medicated for pain when requested. Pnt appears comfortable currently. Will continue to monitor.

## 2016-10-21 LAB — CBC
HEMATOCRIT: 41.2 % (ref 40.0–52.0)
HEMOGLOBIN: 14 g/dL (ref 13.0–18.0)
MCH: 30.9 pg (ref 26.0–34.0)
MCHC: 33.9 g/dL (ref 32.0–36.0)
MCV: 91.2 fL (ref 80.0–100.0)
Platelets: 225 10*3/uL (ref 150–440)
RBC: 4.51 MIL/uL (ref 4.40–5.90)
RDW: 14.8 % — ABNORMAL HIGH (ref 11.5–14.5)
WBC: 14.2 10*3/uL — ABNORMAL HIGH (ref 3.8–10.6)

## 2016-10-21 LAB — GLUCOSE, CAPILLARY: GLUCOSE-CAPILLARY: 86 mg/dL (ref 65–99)

## 2016-10-21 MED ORDER — OXYCODONE-ACETAMINOPHEN 5-325 MG PO TABS: 1.0000 | ORAL_TABLET | Freq: Three times a day (TID) | ORAL | 0 refills | Status: DC | PRN

## 2016-10-21 MED ORDER — CIPROFLOXACIN HCL 500 MG PO TABS: 500.0000 mg | ORAL_TABLET | Freq: Two times a day (BID) | ORAL | 0 refills | Status: DC

## 2016-10-21 MED ORDER — OXYCODONE-ACETAMINOPHEN 5-325 MG PO TABS
2.0000 | ORAL_TABLET | Freq: Once | ORAL | Status: AC
  Administered 2016-10-21: 2 via ORAL
  Filled 2016-10-21: qty 2

## 2016-10-21 MED ORDER — ACETAMINOPHEN 325 MG PO TABS: 325.0000 mg | ORAL_TABLET | Freq: Four times a day (QID) | ORAL | Status: DC | PRN

## 2016-10-21 MED ORDER — NICOTINE 21 MG/24HR TD PT24: 21.0000 mg | MEDICATED_PATCH | Freq: Every day | TRANSDERMAL | 0 refills | Status: DC

## 2016-10-21 MED ORDER — METRONIDAZOLE 500 MG PO TABS: 500.0000 mg | ORAL_TABLET | Freq: Three times a day (TID) | ORAL | 0 refills | Status: DC

## 2016-10-21 MED ORDER — OXYCODONE-ACETAMINOPHEN 5-325 MG PO TABS: 2.0000 | ORAL_TABLET | Freq: Once | ORAL | Status: DC

## 2016-10-21 NOTE — Progress Notes (Signed)
Pt complaining of pain. Pt only has IV Fentenyl ordered. Primary nurse notified Dr. Orbie Pyo. Orders received for Percocet 5/325 two tablets once. Primary nurse to continue to monitor

## 2016-10-21 NOTE — Discharge Summary (Signed)
[facility] NAME: Bryce Haney    MR#:  [id number]  DATE OF BIRTH:  October 27, 1950  DATE OF ADMISSION:  10/19/2016 ADMITTING PHYSICIAN: Theodoro Grist, MD  DATE OF DISCHARGE: 10/21/16 PRIMARY CARE PHYSICIAN: Letta Median, MD    ADMISSION DIAGNOSIS:  Diverticulitis [K57.92] Lower GI bleed [K92.2]  DISCHARGE DIAGNOSIS:  Active Problems:   Lower GI bleed   Leukocytosis   Diverticulitis large intestine   Acute renal insufficiency   Hyponatremia   SECONDARY DIAGNOSIS:   Past Medical History:  Diagnosis Date  . Atrial fibrillation ([place])   . Back pain   . CHF (congestive heart failure) ([place])   . COPD (chronic obstructive pulmonary disease) ([place])   . Hyperlipidemia   . Myocardial infarction ([place])   . S/P CABG (coronary artery bypass graft)   . Thyroid disease     HOSPITAL COURSE:   HISTORY OF PRESENT ILLNESS: Bryce Haney  is a 66 y.o. male with a known history of Coronary artery disease, COPD, ongoing tobacco abuse, CHF, atrial fibrillation, hyperlipidemia, coronary artery bypass grafting, who presents to the hospital with complaints of rectal bleeding, lower abdominal pain. According to the patient, he was doing well up until approximately 12:15 PM today when he started having severe abdominal pain in the lower part of abdomen, cramping, and the rectal bleeding with blood clots and bright red blood. He also admitted of diarrhea. He had at least 20 or 25 episodes of rectal bleeding and diarrhea over the past 5 hours. On arrival to the hospital, his hemoglobin level was found to be stable, hospitalist services were contacted for admission. CT scan of abdomen and pelvis revealed: Inflammation and descending colon and sigmoid, concerning for diverticulitis. Patient's labs revealed elevated white blood cell count.  #1. Lower gastrointestinal bleeding from acute diverticulitis Stool for C. Difficile toxin is  negative Patient is on clear liquids and advance diet as tolerated Pain management as needed Monitor hemoglobin and hematocrit, Patient is hemodynamically stable at this time Patient was seen by gastroenterology who is recommending outpatient follow-up after discharge Will resume aspirin if hemoglobin is stable at the time of discharge  #2. Acute diverticulitis Discontinue IV Zosyn and change antibiotics to by mouth ciprofloxacin and Flagyl for a total of 5 days  #3. Leukocytosis, From acute diverticulitis  #4 acute renal insufficiency,Improving with IV fluids Cr 1.25-- 1.03  #5. Hyponatremia, resolved with IV fluids  #6. Tobacco abuse. Counseling Provided by the admitting physician, nicotine replacement therapy  initiated, he is agreeable  DISCHARGE CONDITIONS:   stable  CONSULTS OBTAINED:   gi  PROCEDURES none  DRUG ALLERGIES:   Allergies  Allergen Reactions  . Morphine And Related Hives    DISCHARGE MEDICATIONS:   Current Discharge Medication List    START taking these medications   Details  acetaminophen (TYLENOL) 325 MG tablet Take 1 tablet (325 mg total) by mouth every 6 (six) hours as needed for mild pain (or Fever >/= 101).    ciprofloxacin (CIPRO) 500 MG tablet Take 1 tablet (500 mg total) by mouth 2 (two) times daily. Qty: 7 tablet, Refills: 0    metroNIDAZOLE (FLAGYL) 500 MG tablet Take 1 tablet (500 mg total) by mouth every 8 (eight) hours. Qty: 10 tablet, Refills: 0    nicotine (NICODERM CQ - DOSED IN MG/24 HOURS) 21 mg/24hr patch Place 1 patch (21 mg total) onto the skin daily. Qty: 28 patch, Refills: 0  CONTINUE these medications which have CHANGED   Details  oxyCODONE-acetaminophen (ROXICET) 5-325 MG tablet Take 1 tablet by mouth every 8 (eight) hours as needed. Qty: 15 tablet, Refills: 0      CONTINUE these medications which have NOT CHANGED   Details  aspirin 81 MG tablet Take 81 mg by mouth daily.    atorvastatin (LIPITOR) 40  MG tablet Take 40 mg by mouth daily.    furosemide (LASIX) 20 MG tablet Take 20 mg by mouth 2 (two) times daily.    gabapentin (NEURONTIN) 300 MG capsule Take 300 mg by mouth at bedtime.     levothyroxine (SYNTHROID, LEVOTHROID) 150 MCG tablet Take 150 mcg by mouth daily before breakfast.    methocarbamol (ROBAXIN) 500 MG tablet Take 500 mg by mouth every 8 (eight) hours as needed for muscle spasms.    metoprolol tartrate (LOPRESSOR) 25 MG tablet Take 12.5 mg by mouth 2 (two) times daily.    mirtazapine (REMERON) 30 MG tablet Take 30 mg by mouth at bedtime.    omeprazole (PRILOSEC) 20 MG capsule Take 20 mg by mouth daily.    ranitidine (ZANTAC) 150 MG tablet Take 150 mg by mouth 2 (two) times daily.    diazepam (VALIUM) 5 MG tablet Take 1 tablet (5 mg total) by mouth every 6 (six) hours as needed for muscle spasms. Qty: 15 tablet, Refills: 0      STOP taking these medications     levofloxacin (LEVAQUIN) 750 MG tablet      predniSONE (STERAPRED UNI-PAK 21 TAB) 10 MG (21) TBPK tablet          DISCHARGE INSTRUCTIONS:   Follow-up with primary care physician in a week Follow-up with gastroenterology dr.Vanga in 1-2 weeks Complete the antibiotic course as prescribed   DIET:  Cardiac diet  DISCHARGE CONDITION:  Stable  ACTIVITY:  Activity as tolerated  OXYGEN:  Home Oxygen: No.   Oxygen Delivery: room air  DISCHARGE LOCATION:  home   If you experience worsening of your admission symptoms, develop shortness of breath, life threatening emergency, suicidal or homicidal thoughts you must seek medical attention immediately by calling 911 or calling your MD immediately  if symptoms less severe.  You Must read complete instructions/literature along with all the possible adverse reactions/side effects for all the Medicines you take and that have been prescribed to you. Take any new Medicines after you have completely understood and accpet all the possible adverse  reactions/side effects.   Please note  You were cared for by a hospitalist during your hospital stay. If you have any questions about your discharge medications or the care you received while you were in the hospital after you are discharged, you can call the unit and asked to speak with the hospitalist on call if the hospitalist that took care of you is not available. Once you are discharged, your primary care physician will handle any further medical issues. Please note that NO REFILLS for any discharge medications will be authorized once you are discharged, as it is imperative that you return to your primary care physician (or establish a relationship with a primary care physician if you do not have one) for your aftercare needs so that they can reassess your need for medications and monitor your lab values.     Today  Chief Complaint  Patient presents with  . Rectal Bleeding    Patient is tolerating diet and pain is manageable with the Percocets, and prefers going home ROS:  CONSTITUTIONAL: Denies fevers, chills. Denies any fatigue, weakness.  EYES: Denies blurry vision, double vision, eye pain. EARS, NOSE, THROAT: Denies tinnitus, ear pain, hearing loss. RESPIRATORY: Denies cough, wheeze, shortness of breath.  CARDIOVASCULAR: Denies chest pain, palpitations, edema.  GASTROINTESTINAL: Denies nausea, vomiting, diarrhea. Denies bright red blood per rectum. Reporting lower abdominal cramps intermittently, Percocet is taking the edge off GENITOURINARY: Denies dysuria, hematuria. ENDOCRINE: Denies nocturia or thyroid problems. HEMATOLOGIC AND LYMPHATIC: Denies easy bruising or bleeding. SKIN: Denies rash or lesion. MUSCULOSKELETAL: Denies pain in neck, back, shoulder, knees, hips or arthritic symptoms.  NEUROLOGIC: Denies paralysis, paresthesias.  PSYCHIATRIC: Denies anxiety or depressive symptoms.   VITAL SIGNS:  Blood pressure 116/70, pulse 64, temperature 97.9 F (36.6 C),  temperature source Oral, resp. rate 20, height 6' (1.829 m), weight 100 kg (220 lb 6.4 oz), SpO2 94 %.  I/O:    Intake/Output Summary (Last 24 hours) at 10/21/16 1205 Last data filed at 10/21/16 1002  Gross per 24 hour  Intake          2877.75 ml  Output                0 ml  Net          2877.75 ml    PHYSICAL EXAMINATION:  GENERAL:  66 y.o.-year-old patient lying in the bed with no acute distress.  EYES: Pupils equal, round, reactive to light and accommodation. No scleral icterus. Extraocular muscles intact.  HEENT: Head atraumatic, normocephalic. Oropharynx and nasopharynx clear.  NECK:  Supple, no jugular venous distention. No thyroid enlargement, no tenderness.  LUNGS: Normal breath sounds bilaterally, no wheezing, rales,rhonchi or crepitation. No use of accessory muscles of respiration.  CARDIOVASCULAR: S1, S2 normal. No murmurs, rubs, or gallops.  ABDOMEN: Soft, non-tender, non-distended. Bowel sounds present.  EXTREMITIES: No pedal edema, cyanosis, or clubbing.  NEUROLOGIC: Cranial nerves II through XII are intact. Muscle strength 5/5 in all extremities. Sensation intact. Gait not checked.  PSYCHIATRIC: The patient is alert and oriented x 3.  SKIN: No obvious rash, lesion, or ulcer.   DATA REVIEW:   CBC  Recent Labs Lab 10/21/16 0313  WBC 14.2*  HGB 14.0  HCT 41.2  PLT 225    Chemistries   Recent Labs Lab 10/19/16 1638 10/20/16 0839  NA 134* 134*  K 3.9 4.2  CL 106 108  CO2 18* 23  GLUCOSE 97 87  BUN 29* 19  CREATININE 1.25* 1.03  CALCIUM 9.1 8.5*  AST 17  --   ALT 20  --   ALKPHOS 75  --   BILITOT 1.0  --     Cardiac Enzymes  Recent Labs Lab 10/20/16 0839  TROPONINI <0.03    Microbiology Results  Results for orders placed or performed during the hospital encounter of 10/19/16  C difficile quick scan w PCR reflex     Status: None   Collection Time: 10/19/16  5:18 PM  Result Value Ref Range Status   C Diff antigen NEGATIVE NEGATIVE Final    C Diff toxin NEGATIVE NEGATIVE Final   C Diff interpretation No C. difficile detected.  Final    RADIOLOGY:  Ct Abdomen Pelvis W Contrast  Result Date: 10/19/2016 CLINICAL DATA:  Rectal pain and bleeding EXAM: CT ABDOMEN AND PELVIS WITH CONTRAST TECHNIQUE: Multidetector CT imaging of the abdomen and pelvis was performed using the standard protocol following bolus administration of intravenous contrast. CONTRAST:  166mL ISOVUE-300 IOPAMIDOL (ISOVUE-300) INJECTION 61% COMPARISON:  10/20/2012 FINDINGS: Lower chest: Dependent  atelectasis in the bases. No effusions. Heart is normal size. Hepatobiliary: No focal liver abnormality is seen. Status post cholecystectomy. No biliary dilatation. Pancreas: No focal abnormality or ductal dilatation. Spleen: No focal abnormality.  Normal size. Adrenals/Urinary Tract: No adrenal abnormality. No focal renal abnormality. No stones or hydronephrosis. Urinary bladder is unremarkable. Stomach/Bowel: Descending colonic and sigmoid diverticulosis. Subtle stranding around the mid to distal descending colon suggesting early active diverticulitis. Stomach and small bowel decompressed, unremarkable. Appendix is normal. Vascular/Lymphatic: Scattered aortic and iliac calcifications. No evidence of aneurysm or adenopathy. Reproductive: No visible focal abnormality. Other: No free fluid or free air. Musculoskeletal: No acute bony abnormality. IMPRESSION: Descending colonic and sigmoid diverticulosis. Slight inflammation around the mid to distal descending colon suggesting early active diverticulitis. Prior cholecystectomy. Aortoiliac atherosclerosis. Electronically Signed   By: Rolm Baptise M.D.   On: 10/19/2016 18:07    EKG:   Orders placed or performed during the hospital encounter of 06/19/15  . ED EKG  . ED EKG  . EKG 12-Lead  . EKG 12-Lead  . EKG      Management plans discussed with the patient, family and they are in agreement.  CODE STATUS:     Code Status  Orders        Start     Ordered   10/19/16 2010  Full code  Continuous     10/19/16 2009    Code Status History    Date Active Date Inactive Code Status Order ID Comments User Context   06/19/2015  8:36 PM 06/21/2015  6:45 PM Full Code [id number]  Henreitta Leber, MD Inpatient   07/24/2014  8:25 AM 07/24/2014  1:43 PM Full Code [id number]  Corey Skains, MD Inpatient      TOTAL TIME TAKING CARE OF THIS PATIENT: 45  minutes.   Note: This dictation was prepared with Dragon dictation along with smaller phrase technology. Any transcriptional errors that result from this process are unintentional.   @[facility] @  on 10/21/2016 at 12:05 PM  Between 7am to 6pm - Pager - [phone]  After 6pm go to [email or web address] - password [email or web address] [facility]  Office  [phone]  CC: Primary care physician; Letta Median, MD

## 2016-10-21 NOTE — Progress Notes (Signed)
Patient discharged to home, follow up appointments and prescriptions given as ordered. IV discontinued site clean dry and intact. No acute distres noted.

## 2016-10-21 NOTE — Discharge Instructions (Signed)
Follow-up with primary care physician in a week Follow-up with gastroenterology dr.Vanga in 1-2 weeks Complete the antibiotic course as prescribed

## 2016-10-28 ENCOUNTER — Ambulatory Visit (INDEPENDENT_AMBULATORY_CARE_PROVIDER_SITE_OTHER): Payer: Medicaid Other | Admitting: Gastroenterology

## 2016-10-28 ENCOUNTER — Encounter: Payer: Self-pay | Admitting: Gastroenterology

## 2016-10-28 VITALS — BP 129/80 | HR 75 | Temp 97.9°F | Ht 72.0 in | Wt 216.2 lb

## 2016-10-28 DIAGNOSIS — Z1211 Encounter for screening for malignant neoplasm of colon: Secondary | ICD-10-CM | POA: Diagnosis not present

## 2016-10-28 NOTE — Progress Notes (Signed)
[name], MD [address]  [place]  [place], [place] [address]  Main: [phone]  Fax: [phone]    Gastroenterology Consultation  Referring Provider:     Letta Median, MD Primary Care Physician:  Letta Median, MD Primary Gastroenterologist:  Dr. [name] Reason for Consultation:     Acute diverticulitis        HPI:   Bryce Haney is a 66 y.o. y/o male referred by Dr. Letta Median, MD  for consultation & management  of Left lower quadrant pain. Is admitted to the hospital last week with 1 day of left lower quadrant pain associated with diarrhea and rectal bleeding, CT is suggestive of acute diverticulitis. Otherwise his hemoglobin was stable. He was treated for acute diverticulitis with 5 days course of Cipro and Flagyl. Today he denies any abdominal pain, rectal bleeding, diarrhea. Reports having one to 2 formed bowel movements daily. He is here to discuss about the colonoscopy. He denies any other complaints today.  GI Procedures: He had a colonoscopy 10 years ago at [facility] and reportedly normal. However I could not find a colonoscopy report in our system. Denies family history of colon cancer.  Past Medical History:  Diagnosis Date  . Atrial fibrillation ([place])   . Back pain   . CHF (congestive heart failure) ([place])   . COPD (chronic obstructive pulmonary disease) ([place])   . Hyperlipidemia   . Myocardial infarction ([place])   . S/P CABG (coronary artery bypass graft)   . Thyroid disease     Past Surgical History:  Procedure Laterality Date  . CARDIAC CATHETERIZATION N/A 07/24/2014   Procedure: Left Heart Cath;  Surgeon: Corey Skains, MD;  Location: [facility];  Service: Cardiovascular;  Laterality: N/A;  . CORONARY ANGIOPLASTY    . CORONARY ANGIOPLASTY WITH STENT PLACEMENT    . CORONARY ARTERY BYPASS GRAFT      Prior to Admission medications   Medication Sig Start Date End Date Taking? Authorizing Provider    acetaminophen (TYLENOL) 325 MG tablet Take 1 tablet (325 mg total) by mouth every 6 (six) hours as needed for mild pain (or Fever >/= 101). 10/21/16  Yes Gouru, Illene Silver, MD  aspirin 81 MG tablet Take 81 mg by mouth daily.   Yes [provider]  atorvastatin (LIPITOR) 40 MG tablet Take 40 mg by mouth daily.   Yes [provider]  ciprofloxacin (CIPRO) 500 MG tablet Take 1 tablet (500 mg total) by mouth 2 (two) times daily. 10/21/16  Yes Gouru, Illene Silver, MD  diazepam (VALIUM) 5 MG tablet Take 1 tablet (5 mg total) by mouth every 6 (six) hours as needed for muscle spasms. 10/09/16 10/09/17 Yes Little, Traci M, PA-C  furosemide (LASIX) 20 MG tablet Take 20 mg by mouth 2 (two) times daily.   Yes [provider]  gabapentin (NEURONTIN) 300 MG capsule Take 300 mg by mouth at bedtime.    Yes [provider]  levothyroxine (SYNTHROID, LEVOTHROID) 150 MCG tablet Take 150 mcg by mouth daily before breakfast.   Yes [provider]  methocarbamol (ROBAXIN) 500 MG tablet Take 500 mg by mouth every 8 (eight) hours as needed for muscle spasms.   Yes [provider]  metoprolol tartrate (LOPRESSOR) 25 MG tablet Take 12.5 mg by mouth 2 (two) times daily.   Yes [provider]  metroNIDAZOLE (FLAGYL) 500 MG tablet Take 1 tablet (500 mg total) by mouth every 8 (eight) hours.  10/21/16  Yes Gouru, Illene Silver, MD  mirtazapine (REMERON) 30 MG tablet Take 30 mg by mouth at bedtime.   Yes [provider]  nicotine (NICODERM CQ - DOSED IN MG/24 HOURS) 21 mg/24hr patch Place 1 patch (21 mg total) onto the skin daily. 10/22/16  Yes Gouru, Illene Silver, MD  omeprazole (PRILOSEC) 20 MG capsule Take 20 mg by mouth daily.   Yes [provider]  oxyCODONE-acetaminophen (ROXICET) 5-325 MG tablet Take 1 tablet by mouth every 8 (eight) hours as needed. 10/21/16  Yes Gouru, Illene Silver, MD  ranitidine (ZANTAC) 150 MG tablet Take by mouth. 06/05/15  Yes [provider]    Family  History  Problem Relation Age of Onset  . Heart disease Mother   . Heart disease Father      Social History  Substance Use Topics  . Smoking status: Current Every Day Smoker    Packs/day: 1.00    Years: 35.00  . Smokeless tobacco: Never Used  . Alcohol use No    Allergies as of 10/28/2016 - Review Complete 10/28/2016  Allergen Reaction Noted  . Morphine and related Hives 07/24/2014    Review of Systems:    All systems reviewed and negative except where noted in HPI.   Physical Exam:  BP 129/80   Pulse 75   Temp 97.9 F (36.6 C) (Oral)   Ht 6' (1.829 m)   Wt 98.1 kg (216 lb 3.2 oz)   BMI 29.32 kg/m  No LMP for male patient.  General:   Alert,  Well-developed, well-nourished, pleasant and cooperative in NAD Head:  Normocephalic and atraumatic. Eyes:  Sclera clear, no icterus.   Conjunctiva pink. Ears:  Normal auditory acuity. Nose:  No deformity, discharge, or lesions. Mouth:  No deformity or lesions,oropharynx pink & moist. Neck:  Supple; no masses or thyromegaly. Lungs:  Respirations even and unlabored.  Clear throughout to auscultation.   No wheezes, crackles, or rhonchi. No acute distress. Heart:  Regular rate and rhythm; no murmurs, clicks, rubs, or gallops. Abdomen:  Normal bowel sounds.  No bruits.  Soft, non-tender and non-distended without masses, hepatosplenomegaly or hernias noted.  No guarding or rebound tenderness.   Rectal: Nor performed Msk:  Symmetrical without gross deformities. Good, equal movement & strength bilaterally. Pulses:  Normal pulses noted. Extremities:  No clubbing or edema.  No cyanosis. Neurologic:  Alert and oriented x3;  grossly normal neurologically. Skin:  Intact without significant lesions or rashes. No jaundice. Lymph Nodes:  No significant cervical adenopathy. Psych:  Alert and cooperative. Normal mood and affect.  Imaging Studies:   Assessment and Plan:   Bryce Haney is a 66 y.o. y/o male with Acute uncomplicated  diverticulitis with recent admission and responded well to 5 days of Cipro and Flagyl. He is currently asymptomatic. We discussed about the risks and benefits of colonoscopy today and he is agreeable for the procedure. We will schedule colonoscopy in next 4 weeks.  Follow up as needed   [name], MD

## 2016-11-22 ENCOUNTER — Telehealth: Payer: Self-pay | Admitting: Gastroenterology

## 2016-11-22 NOTE — Telephone Encounter (Signed)
Patient left a voice message that he has the flu and needs to reschedule. Please call

## 2016-12-30 ENCOUNTER — Ambulatory Visit
Admission: RE | Admit: 2016-12-30 | Discharge: 2016-12-30 | Disposition: A | Discharge status: Home / Self Care | Payer: Medicaid Other | Source: Ambulatory Visit | Attending: Gastroenterology | Admitting: Gastroenterology

## 2016-12-30 ENCOUNTER — Ambulatory Visit: Payer: Medicaid Other | Admitting: Anesthesiology

## 2016-12-30 ENCOUNTER — Encounter
Admission: RE | Disposition: A | Discharge status: Home / Self Care | Payer: Self-pay | Source: Ambulatory Visit | Attending: Gastroenterology

## 2016-12-30 DIAGNOSIS — I252 Old myocardial infarction: Secondary | ICD-10-CM | POA: Insufficient documentation

## 2016-12-30 DIAGNOSIS — Z1211 Encounter for screening for malignant neoplasm of colon: Secondary | ICD-10-CM

## 2016-12-30 DIAGNOSIS — K648 Other hemorrhoids: Secondary | ICD-10-CM | POA: Insufficient documentation

## 2016-12-30 DIAGNOSIS — I2510 Atherosclerotic heart disease of native coronary artery without angina pectoris: Secondary | ICD-10-CM | POA: Insufficient documentation

## 2016-12-30 DIAGNOSIS — I509 Heart failure, unspecified: Secondary | ICD-10-CM | POA: Diagnosis not present

## 2016-12-30 DIAGNOSIS — F17210 Nicotine dependence, cigarettes, uncomplicated: Secondary | ICD-10-CM | POA: Diagnosis not present

## 2016-12-30 DIAGNOSIS — Z79899 Other long term (current) drug therapy: Secondary | ICD-10-CM | POA: Diagnosis not present

## 2016-12-30 DIAGNOSIS — I4891 Unspecified atrial fibrillation: Secondary | ICD-10-CM | POA: Diagnosis not present

## 2016-12-30 DIAGNOSIS — K5730 Diverticulosis of large intestine without perforation or abscess without bleeding: Secondary | ICD-10-CM | POA: Diagnosis not present

## 2016-12-30 DIAGNOSIS — E785 Hyperlipidemia, unspecified: Secondary | ICD-10-CM | POA: Diagnosis not present

## 2016-12-30 DIAGNOSIS — Z951 Presence of aortocoronary bypass graft: Secondary | ICD-10-CM | POA: Diagnosis not present

## 2016-12-30 DIAGNOSIS — I110 Hypertensive heart disease with heart failure: Secondary | ICD-10-CM | POA: Insufficient documentation

## 2016-12-30 DIAGNOSIS — J449 Chronic obstructive pulmonary disease, unspecified: Secondary | ICD-10-CM | POA: Diagnosis not present

## 2016-12-30 DIAGNOSIS — D124 Benign neoplasm of descending colon: Secondary | ICD-10-CM | POA: Insufficient documentation

## 2016-12-30 DIAGNOSIS — E039 Hypothyroidism, unspecified: Secondary | ICD-10-CM | POA: Diagnosis not present

## 2016-12-30 DIAGNOSIS — D122 Benign neoplasm of ascending colon: Secondary | ICD-10-CM | POA: Diagnosis not present

## 2016-12-30 DIAGNOSIS — Z955 Presence of coronary angioplasty implant and graft: Secondary | ICD-10-CM | POA: Diagnosis not present

## 2016-12-30 DIAGNOSIS — Z7982 Long term (current) use of aspirin: Secondary | ICD-10-CM | POA: Insufficient documentation

## 2016-12-30 HISTORY — PX: COLONOSCOPY WITH PROPOFOL: SHX5780

## 2016-12-30 SURGERY — COLONOSCOPY WITH PROPOFOL
Anesthesia: General

## 2016-12-30 MED ORDER — LIDOCAINE HCL (PF) 2 % IJ SOLN
INTRAMUSCULAR | Status: DC | PRN
  Administered 2016-12-30: 50 mg via INTRADERMAL

## 2016-12-30 MED ORDER — SODIUM CHLORIDE 0.9 % IV SOLN
INTRAVENOUS | Status: DC | PRN
  Administered 2016-12-30: 10:00:00 via INTRAVENOUS

## 2016-12-30 MED ORDER — PROPOFOL 10 MG/ML IV BOLUS
INTRAVENOUS | Status: DC | PRN
  Administered 2016-12-30: 60 mg via INTRAVENOUS

## 2016-12-30 MED ORDER — PROPOFOL 500 MG/50ML IV EMUL
INTRAVENOUS | Status: AC
  Filled 2016-12-30: qty 50

## 2016-12-30 MED ORDER — PROPOFOL 500 MG/50ML IV EMUL
INTRAVENOUS | Status: DC | PRN
  Administered 2016-12-30: 120 ug/kg/min via INTRAVENOUS

## 2016-12-30 MED ORDER — SODIUM CHLORIDE 0.9 % IV SOLN
INTRAVENOUS | Status: DC
  Administered 2016-12-30: 10:00:00 via INTRAVENOUS

## 2016-12-30 NOTE — Transfer of Care (Signed)
Immediate Anesthesia Transfer of Care Note  Patient: Bryce Haney  Procedure(s) Performed: COLONOSCOPY WITH PROPOFOL (N/A )  Patient Location: PACU  Anesthesia Type:General  Level of Consciousness: awake and sedated  Airway & Oxygen Therapy: Patient Spontanous Breathing and Patient connected to nasal cannula oxygen  Post-op Assessment: Report given to RN and Post -op Vital signs reviewed and stable  Post vital signs: Reviewed and stable  Last Vitals:  Vitals:   12/30/16 0931  BP: 123/85  Pulse: 82  Resp: 18  Temp: (!) 36.1 C  SpO2: 96%    Last Pain:  Vitals:   12/30/16 0931  TempSrc: Tympanic         Complications: No apparent anesthesia complications

## 2016-12-30 NOTE — Anesthesia Post-op Follow-up Note (Signed)
Anesthesia QCDR form completed.        

## 2016-12-30 NOTE — Op Note (Signed)
[facility] Gastroenterology Patient Name: Bryce Haney Procedure Date: 12/30/2016 10:43 AM MRN: [id number] Account #: [id number] Date of Birth: [birth year] Admit Type: Outpatient Age: 66 Room: [facility] ENDO ROOM 3 Gender: Male Note Status: Finalized Procedure:            Colonoscopy Indications:          Screening for colorectal malignant neoplasm, Last                        colonoscopy: December 2009 Providers:            Lin Landsman MD, MD Referring MD:         Baxter Kail. Rebeca Alert MD, MD (Referring MD) Medicines:            Monitored Anesthesia Care Complications:        No immediate complications. Estimated blood loss:                        Minimal. Procedure:            Pre-Anesthesia Assessment:                       - Prior to the procedure, a History and Physical was                        performed, and patient medications and allergies were                        reviewed. The patient is competent. The risks and                        benefits of the procedure and the sedation options and                        risks were discussed with the patient. All questions                        were answered and informed consent was obtained.                        Patient identification and proposed procedure were                        verified by the physician, the nurse, the                        anesthesiologist, the anesthetist and the technician in                        the pre-procedure area in the procedure room. Mental                        Status Examination: alert and oriented. Airway                        Examination: normal oropharyngeal airway and neck                        mobility. Respiratory Examination: clear to  auscultation. CV Examination: normal. Prophylactic                        Antibiotics: The patient does not require prophylactic                        antibiotics. Prior Anticoagulants: The patient has                         taken aspirin. ASA Grade Assessment: III - A patient                        with severe systemic disease. After reviewing the risks                        and benefits, the patient was deemed in satisfactory                        condition to undergo the procedure. The anesthesia plan                        was to use monitored anesthesia care (MAC). Immediately                        prior to administration of medications, the patient was                        re-assessed for adequacy to receive sedatives. The                        heart rate, respiratory rate, oxygen saturations, blood                        pressure, adequacy of pulmonary ventilation, and                        response to care were monitored throughout the                        procedure. The physical status of the patient was                        re-assessed after the procedure.                       After obtaining informed consent, the colonoscope was                        passed under direct vision. Throughout the procedure,                        the patient's blood pressure, pulse, and oxygen                        saturations were monitored continuously. The                        Colonoscope was introduced through the anus and                        advanced  to the the terminal ileum. The colonoscopy was                        performed without difficulty. The patient tolerated the                        procedure well. The quality of the bowel preparation                        was evaluated using the BBPS [facility] Bowel Preparation                        Scale) with scores of: Right Colon = 3, Transverse                        Colon = 3 and Left Colon = 3 (entire mucosa seen well                        with no residual staining, small fragments of stool or                        opaque liquid). The total BBPS score equals 9. Findings:      The perianal and digital rectal  examinations were normal. Pertinent       negatives include normal sphincter tone and no palpable rectal lesions.      The terminal ileum appeared normal.      A 4 mm polyp was found in the ascending colon. The polyp was sessile.       The polyp was removed with a cold biopsy forceps. Resection and       retrieval were complete.      A 6 mm polyp was found in the ascending colon. The polyp was sessile.       The polyp was removed with a cold snare. Resection and retrieval were       complete.      A 8 mm polyp was found in the descending colon. The polyp was sessile.       The polyp was removed with a hot snare. Resection and retrieval were       complete.      A 6 mm polyp was found in the descending colon. The polyp was flat. The       polyp was removed with a cold snare. Resection and retrieval were       complete.      A few small-mouthed diverticula were found in the sigmoid colon.      Non-bleeding internal hemorrhoids were found during retroflexion. The       hemorrhoids were medium-sized. Impression:           - The examined portion of the ileum was normal.                       - One 4 mm polyp in the ascending colon, removed with a                        cold biopsy forceps. Resected and retrieved.                       - One 6 mm polyp in the ascending colon,  removed with a                        cold snare. Resected and retrieved.                       - One 8 mm polyp in the descending colon, removed with                        a hot snare. Resected and retrieved.                       - One 6 mm polyp in the descending colon, removed with                        a cold snare. Resected and retrieved.                       - Diverticulosis in the sigmoid colon.                       - Non-bleeding internal hemorrhoids. Recommendation:       - Discharge patient to home.                       - Resume previous diet today.                       - Continue present medications.                        - Await pathology results.                       - Repeat colonoscopy in 3 - 5 years for surveillance                        based on pathology results. Procedure Code(s):    --- Professional ---                       [phone], Colonoscopy, flexible; with removal of tumor(s),                        polyp(s), or other lesion(s) by snare technique                       [id number], 38, Colonoscopy, flexible; with biopsy, single                        or multiple Diagnosis Code(s):    --- Professional ---                       Z12.11, Encounter for screening for malignant neoplasm                        of colon                       K64.8, Other hemorrhoids                       D12.2, Benign neoplasm of ascending colon  D12.4, Benign neoplasm of descending colon                       K57.30, Diverticulosis of large intestine without                        perforation or abscess without bleeding CPT copyright 2016 American Medical Association. All rights reserved. The codes documented in this report are preliminary and upon coder review may  be revised to meet current compliance requirements. Dr. Ulyess Mort Lin Landsman MD, MD 12/30/2016 11:33:36 AM This report has been signed electronically. Number of Addenda: 0 Note Initiated On: 12/30/2016 10:43 AM Scope Withdrawal Time: 0 hours 20 minutes 30 seconds  Total Procedure Duration: 0 hours 22 minutes 59 seconds       [facility]

## 2016-12-30 NOTE — Anesthesia Postprocedure Evaluation (Signed)
Anesthesia Post Note  Patient: Bryce Haney  Procedure(s) Performed: COLONOSCOPY WITH PROPOFOL (N/A )  Patient location during evaluation: Endoscopy Anesthesia Type: General Level of consciousness: awake and alert and oriented Pain management: pain level controlled Vital Signs Assessment: post-procedure vital signs reviewed and stable Respiratory status: spontaneous breathing, nonlabored ventilation and respiratory function stable Cardiovascular status: blood pressure returned to baseline and stable Postop Assessment: no signs of nausea or vomiting Anesthetic complications: no     Last Vitals:  Vitals:   12/30/16 1150 12/30/16 1200  BP: 135/86 124/77  Pulse:    Resp:    Temp:    SpO2:      Last Pain:  Vitals:   12/30/16 1130  TempSrc: Tympanic                 Anastasya Jewell

## 2016-12-30 NOTE — Anesthesia Preprocedure Evaluation (Signed)
Anesthesia Evaluation  Patient identified by MRN, date of birth, ID band Patient awake    Reviewed: Allergy & Precautions, NPO status , Patient's Chart, lab work & pertinent test results  History of Anesthesia Complications Negative for: history of anesthetic complications  Airway Mallampati: II  TM Distance: >3 FB Neck ROM: Full    Dental  (+) Poor Dentition   Pulmonary neg sleep apnea, COPD, Current Smoker,    breath sounds clear to auscultation- rhonchi (-) wheezing      Cardiovascular hypertension, + CAD, + Past MI, + Cardiac Stents (all stents prior to CABG), + CABG (05/2015) and +CHF   Rhythm:Regular Rate:Normal - Systolic murmurs and - Diastolic murmurs Echo 05/27/00: NORMAL LEFT VENTRICULAR FUNCTION WITH MODERATE LVH NORMAL LA PRESSURES WITH NORMAL DIASTOLIC FUNCTION NORMAL RIGHT VENTRICULAR SYSTOLIC FUNCTION VALVULAR REGURGITATION: TRIVIAL MR, TRIVIAL TR NO VALVULAR STENOSIS   Neuro/Psych PSYCHIATRIC DISORDERS Depression negative neurological ROS     GI/Hepatic GERD  ,  Endo/Other  neg diabetesHypothyroidism   Renal/GU Renal InsufficiencyRenal disease     Musculoskeletal negative musculoskeletal ROS (+)   Abdominal (+) - obese,   Peds  Hematology negative hematology ROS (+)   Anesthesia Other Findings Past Medical History: No date: Atrial fibrillation (HCC) No date: Back pain No date: CHF (congestive heart failure) (HCC) No date: COPD (chronic obstructive pulmonary disease) (HCC) No date: Hyperlipidemia No date: Myocardial infarction ([place]) No date: S/P CABG (coronary artery bypass graft) No date: Thyroid disease   Reproductive/Obstetrics                             Anesthesia Physical Anesthesia Plan  ASA: III  Anesthesia Plan: General   Post-op Pain Management:    Induction: Intravenous  PONV Risk Score and Plan: 0 and Propofol infusion  Airway Management  Planned: Natural Airway  Additional Equipment:   Intra-op Plan:   Post-operative Plan:   Informed Consent: I have reviewed the patients History and Physical, chart, labs and discussed the procedure including the risks, benefits and alternatives for the proposed anesthesia with the patient or authorized representative who has indicated his/her understanding and acceptance.   Dental advisory given  Plan Discussed with: CRNA and Anesthesiologist  Anesthesia Plan Comments:         Anesthesia Quick Evaluation

## 2016-12-30 NOTE — H&P (Signed)
[name], MD [address]  [place]  [place], [place] [address]  Main: [phone]  Fax: [phone] Pager: [phone]  Primary Care Physician:  Letta Median, MD Primary Gastroenterologist:  Dr. [name]  Pre-Procedure History & Physical: HPI:  Maxum Cassarino is a 66 y.o. male is here for an colonoscopy.   Past Medical History:  Diagnosis Date  . Atrial fibrillation ([place])   . Back pain   . CHF (congestive heart failure) ([place])   . COPD (chronic obstructive pulmonary disease) ([place])   . Hyperlipidemia   . Myocardial infarction ([place])   . S/P CABG (coronary artery bypass graft)   . Thyroid disease     Past Surgical History:  Procedure Laterality Date  . CARDIAC CATHETERIZATION N/A 07/24/2014   Procedure: Left Heart Cath;  Surgeon: Corey Skains, MD;  Location: [facility];  Service: Cardiovascular;  Laterality: N/A;  . CORONARY ANGIOPLASTY    . CORONARY ANGIOPLASTY WITH STENT PLACEMENT    . CORONARY ARTERY BYPASS GRAFT    . JOINT REPLACEMENT  2010   right knee replacement   . JOINT REPLACEMENT  2016   left knee replacement    Prior to Admission medications   Medication Sig Start Date End Date Taking? Authorizing Provider  aspirin 81 MG tablet Take 81 mg by mouth daily.   Yes [provider]  atorvastatin (LIPITOR) 40 MG tablet Take 40 mg by mouth daily.   Yes [provider]  gabapentin (NEURONTIN) 300 MG capsule Take 300 mg by mouth at bedtime.    Yes [provider]  levothyroxine (SYNTHROID, LEVOTHROID) 150 MCG tablet Take 150 mcg by mouth daily before breakfast.   Yes [provider]  methocarbamol (ROBAXIN) 500 MG tablet Take 500 mg by mouth every 8 (eight) hours as needed for muscle spasms.   Yes [provider]  metoprolol tartrate (LOPRESSOR) 25 MG tablet Take 12.5 mg by mouth 2 (two) times daily.   Yes [provider]  mirtazapine (REMERON) 30 MG tablet Take 30 mg by  mouth at bedtime.   Yes [provider]  omeprazole (PRILOSEC) 20 MG capsule Take 20 mg by mouth daily.   Yes [provider]  ranitidine (ZANTAC) 150 MG tablet Take by mouth. 06/05/15  Yes [provider]  acetaminophen (TYLENOL) 325 MG tablet Take 1 tablet (325 mg total) by mouth every 6 (six) hours as needed for mild pain (or Fever >/= 101). 10/21/16   Nicholes Mango, MD  ciprofloxacin (CIPRO) 500 MG tablet Take 1 tablet (500 mg total) by mouth 2 (two) times daily. Patient not taking: Reported on 12/30/2016 10/21/16   Nicholes Mango, MD  diazepam (VALIUM) 5 MG tablet Take 1 tablet (5 mg total) by mouth every 6 (six) hours as needed for muscle spasms. Patient not taking: Reported on 12/30/2016 10/09/16 10/09/17  Little, Traci M, PA-C  furosemide (LASIX) 20 MG tablet Take 20 mg by mouth 2 (two) times daily.    [provider]  metroNIDAZOLE (FLAGYL) 500 MG tablet Take 1 tablet (500 mg total) by mouth every 8 (eight) hours. Patient not taking: Reported on 12/30/2016 10/21/16   Nicholes Mango, MD  nicotine (NICODERM CQ - DOSED IN MG/24 HOURS) 21 mg/24hr patch Place 1 patch (21 mg total) onto the skin daily. Patient not taking: Reported on 12/30/2016 10/22/16   Nicholes Mango, MD  oxyCODONE-acetaminophen (ROXICET) 5-325 MG tablet Take 1 tablet by mouth every 8 (eight) hours as needed.  Patient not taking: Reported on 12/30/2016 10/21/16   Nicholes Mango, MD    Allergies as of 10/28/2016 - Review Complete 10/28/2016  Allergen Reaction Noted  . Morphine and related Hives 07/24/2014    Family History  Problem Relation Age of Onset  . Heart disease Mother   . Heart disease Father     Social History   Social History  . Marital status: Single    Spouse name: N/A  . Number of children: N/A  . Years of education: N/A   Occupational History  . Not on file.   Social History Main Topics  . Smoking status: Current Every Day Smoker    Packs/day: 1.00    Years: 35.00  .  Smokeless tobacco: Never Used  . Alcohol use No  . Drug use: No  . Sexual activity: Not on file   Other Topics Concern  . Not on file   Social History Narrative  . No narrative on file    Review of Systems: See HPI, otherwise negative ROS  Physical Exam: BP 123/85   Pulse 82   Temp (!) 97 F (36.1 C) (Tympanic)   Resp 18   Ht 6' (1.829 m)   Wt 220 lb (99.8 kg)   SpO2 96%   BMI 29.84 kg/m  General:   Alert,  pleasant and cooperative in NAD Head:  Normocephalic and atraumatic. Neck:  Supple; no masses or thyromegaly. Lungs:  Clear throughout to auscultation.    Heart:  Regular rate and rhythm. Abdomen:  Soft, nontender and nondistended. Normal bowel sounds, without guarding, and without rebound.   Neurologic:  Alert and  oriented x4;  grossly normal neurologically.  Impression/Plan: Ethanjames Fontenot is here for an colonoscopy to be performed for colon cancer screening  Risks, benefits, limitations, and alternatives regarding  colonoscopy have been reviewed with the patient.  Questions have been answered.  All parties agreeable.   Sherri Sear, MD  12/30/2016, 9:36 AM

## 2016-12-31 ENCOUNTER — Encounter: Payer: Self-pay | Admitting: Gastroenterology

## 2016-12-31 LAB — SURGICAL PATHOLOGY

## 2017-10-08 ENCOUNTER — Emergency Department
Admission: EM | Admit: 2017-10-08 | Discharge: 2017-10-08 | Disposition: A | Discharge status: Home / Self Care | Payer: Medicaid Other | Attending: Emergency Medicine | Admitting: Emergency Medicine

## 2017-10-08 ENCOUNTER — Other Ambulatory Visit: Payer: Self-pay

## 2017-10-08 ENCOUNTER — Encounter: Payer: Self-pay | Admitting: Emergency Medicine

## 2017-10-08 ENCOUNTER — Emergency Department: Payer: Medicaid Other

## 2017-10-08 DIAGNOSIS — Z7982 Long term (current) use of aspirin: Secondary | ICD-10-CM | POA: Insufficient documentation

## 2017-10-08 DIAGNOSIS — M25532 Pain in left wrist: Secondary | ICD-10-CM | POA: Diagnosis present

## 2017-10-08 DIAGNOSIS — M659 Synovitis and tenosynovitis, unspecified: Secondary | ICD-10-CM | POA: Diagnosis not present

## 2017-10-08 DIAGNOSIS — Z955 Presence of coronary angioplasty implant and graft: Secondary | ICD-10-CM | POA: Insufficient documentation

## 2017-10-08 DIAGNOSIS — I509 Heart failure, unspecified: Secondary | ICD-10-CM | POA: Insufficient documentation

## 2017-10-08 DIAGNOSIS — Z79899 Other long term (current) drug therapy: Secondary | ICD-10-CM | POA: Insufficient documentation

## 2017-10-08 DIAGNOSIS — J449 Chronic obstructive pulmonary disease, unspecified: Secondary | ICD-10-CM | POA: Insufficient documentation

## 2017-10-08 DIAGNOSIS — I2510 Atherosclerotic heart disease of native coronary artery without angina pectoris: Secondary | ICD-10-CM | POA: Insufficient documentation

## 2017-10-08 DIAGNOSIS — I252 Old myocardial infarction: Secondary | ICD-10-CM | POA: Insufficient documentation

## 2017-10-08 DIAGNOSIS — E039 Hypothyroidism, unspecified: Secondary | ICD-10-CM | POA: Diagnosis not present

## 2017-10-08 DIAGNOSIS — G8929 Other chronic pain: Secondary | ICD-10-CM | POA: Insufficient documentation

## 2017-10-08 DIAGNOSIS — Z951 Presence of aortocoronary bypass graft: Secondary | ICD-10-CM | POA: Diagnosis not present

## 2017-10-08 DIAGNOSIS — F17210 Nicotine dependence, cigarettes, uncomplicated: Secondary | ICD-10-CM | POA: Insufficient documentation

## 2017-10-08 MED ORDER — PREDNISONE 20 MG PO TABS
60.0000 mg | ORAL_TABLET | Freq: Once | ORAL | Status: AC
  Administered 2017-10-08: 60 mg via ORAL
  Filled 2017-10-08: qty 3

## 2017-10-08 MED ORDER — METHYLPREDNISOLONE 4 MG PO TBPK: ORAL_TABLET | ORAL | 0 refills | Status: DC

## 2017-10-08 MED ORDER — TRAMADOL HCL 50 MG PO TABS: 50.0000 mg | ORAL_TABLET | Freq: Two times a day (BID) | ORAL | 0 refills | Status: DC | PRN

## 2017-10-08 NOTE — ED Triage Notes (Signed)
L wrist pain x 1 month. States history of arthritis. Denies injury.

## 2017-10-08 NOTE — Discharge Instructions (Addendum)
Wear splint 7 to 10 days while awake.

## 2017-10-08 NOTE — ED Provider Notes (Addendum)
[facility] Emergency Department Provider Note   ____________________________________________   First MD Initiated Contact with Patient 10/08/17 1035     (approximate)  I have reviewed the triage vital signs and the nursing notes.   HISTORY  Chief Complaint Wrist Pain    HPI Bryce Haney is a 67 y.o. male patient complain of chronic left wrist pain that has increased in the last month.  Patient denies any provocative incident for complaint.  Patient rates pain as a 10/10.  Patient described the pain is "achy".  Patient is wearing a wrist splint.  Patient is right-hand dominant.   Past Medical History:  Diagnosis Date  . Atrial fibrillation ([place])   . Back pain   . CHF (congestive heart failure) ([place])   . COPD (chronic obstructive pulmonary disease) ([place])   . Hyperlipidemia   . Myocardial infarction ([place])   . S/P CABG (coronary artery bypass graft)   . Thyroid disease     Patient Active Problem List   Diagnosis Date Noted  . Special screening for malignant neoplasms, colon   . Lower GI bleed 10/19/2016  . Leukocytosis 10/19/2016  . Diverticulitis large intestine 10/19/2016  . Acute renal insufficiency 10/19/2016  . Hyponatremia 10/19/2016  . Coronary artery disease involving autologous artery coronary bypass graft 06/30/2016  . Paroxysmal A-fib ([place]) 07/08/2015  . Pneumonia 06/19/2015  . Phlebitis and thrombophlebitis of superficial vessels of lower extremities, bilateral 06/05/2015  . S/P CABG x 2 05/29/2015  . Chronic depression 05/23/2015  . GERD (gastroesophageal reflux disease) 05/23/2015  . CAD (coronary artery disease) 11/01/2012  . Mixed hyperlipidemia 11/01/2012  . Hypothyroidism, unspecified 10/25/2012  . STEMI (ST elevation myocardial infarction) ([place]) 10/25/2012    Past Surgical History:  Procedure Laterality Date  . CARDIAC CATHETERIZATION N/A 07/24/2014   Procedure: Left Heart Cath;  Surgeon: Corey Skains, MD;   Location: [place] [facility];  Service: Cardiovascular;  Laterality: N/A;  . COLONOSCOPY WITH PROPOFOL N/A 12/30/2016   Procedure: COLONOSCOPY WITH PROPOFOL;  Surgeon: Lin Landsman, MD;  Location: [facility] ENDOSCOPY;  Service: Gastroenterology;  Laterality: N/A;  . CORONARY ANGIOPLASTY    . CORONARY ANGIOPLASTY WITH STENT PLACEMENT    . CORONARY ARTERY BYPASS GRAFT    . JOINT REPLACEMENT  2010   right knee replacement   . JOINT REPLACEMENT  2016   left knee replacement    Prior to Admission medications   Medication Sig Start Date End Date Taking? Authorizing Provider  acetaminophen (TYLENOL) 325 MG tablet Take 1 tablet (325 mg total) by mouth every 6 (six) hours as needed for mild pain (or Fever >/= 101). 10/21/16   Nicholes Mango, MD  aspirin 81 MG tablet Take 81 mg by mouth daily.    [provider]  atorvastatin (LIPITOR) 40 MG tablet Take 40 mg by mouth daily.    [provider]  ciprofloxacin (CIPRO) 500 MG tablet Take 1 tablet (500 mg total) by mouth 2 (two) times daily. Patient not taking: Reported on 12/30/2016 10/21/16   Nicholes Mango, MD  diazepam (VALIUM) 5 MG tablet Take 1 tablet (5 mg total) by mouth every 6 (six) hours as needed for muscle spasms. Patient not taking: Reported on 12/30/2016 10/09/16 10/09/17  Little, Traci M, PA-C  furosemide (LASIX) 20 MG tablet Take 20 mg by mouth 2 (two) times daily.    [provider]  gabapentin (NEURONTIN) 300 MG capsule Take 300 mg by mouth at bedtime.     [provider]  levothyroxine (SYNTHROID, LEVOTHROID) 150 MCG tablet Take 150 mcg by mouth daily before breakfast.    [provider]  methocarbamol (ROBAXIN) 500 MG tablet Take 500 mg by mouth every 8 (eight) hours as needed for muscle spasms.    [provider]  methylPREDNISolone (MEDROL DOSEPAK) 4 MG TBPK tablet Take Tapered dose as directed 10/08/17   Sable Feil, PA-C  metoprolol tartrate (LOPRESSOR) 25 MG tablet Take 12.5 mg  by mouth 2 (two) times daily.    [provider]  metroNIDAZOLE (FLAGYL) 500 MG tablet Take 1 tablet (500 mg total) by mouth every 8 (eight) hours. Patient not taking: Reported on 12/30/2016 10/21/16   Nicholes Mango, MD  mirtazapine (REMERON) 30 MG tablet Take 30 mg by mouth at bedtime.    [provider]  nicotine (NICODERM CQ - DOSED IN MG/24 HOURS) 21 mg/24hr patch Place 1 patch (21 mg total) onto the skin daily. Patient not taking: Reported on 12/30/2016 10/22/16   Nicholes Mango, MD  omeprazole (PRILOSEC) 20 MG capsule Take 20 mg by mouth daily.    [provider]  oxyCODONE-acetaminophen (ROXICET) 5-325 MG tablet Take 1 tablet by mouth every 8 (eight) hours as needed. Patient not taking: Reported on 12/30/2016 10/21/16   Nicholes Mango, MD  ranitidine (ZANTAC) 150 MG tablet Take by mouth. 06/05/15   [provider]  traMADol (ULTRAM) 50 MG tablet Take 1 tablet (50 mg total) by mouth every 12 (twelve) hours as needed. 10/08/17   Sable Feil, PA-C    Allergies Morphine and related  Family History  Problem Relation Age of Onset  . Heart disease Mother   . Heart disease Father     Social History Social History   Tobacco Use  . Smoking status: Current Every Day Smoker    Packs/day: 1.00    Years: 35.00    Pack years: 35.00  . Smokeless tobacco: Never Used  Substance Use Topics  . Alcohol use: No    Alcohol/week: 0.0 oz  . Drug use: No    Review of Systems Constitutional: No fever/chills Eyes: No visual changes. ENT: No sore throat. Cardiovascular: Denies chest pain. Respiratory: Denies shortness of breath. Gastrointestinal: No abdominal pain.  No nausea, no vomiting.  No diarrhea.  No constipation. Genitourinary: Negative for dysuria. Musculoskeletal: Left wrist pain. Skin: Negative for rash. Neurological: Negative for headaches, focal weakness or numbness. Endocrine:Hypothyroidism and hyperlipidemia. Allergic/Immunilogical:  Morphine ____________________________________________   PHYSICAL EXAM:  VITAL SIGNS: ED Triage Vitals  Enc Vitals Group     BP 10/08/17 1025 (!) 145/107     Pulse Rate 10/08/17 1025 82     Resp 10/08/17 1025 20     Temp 10/08/17 1025 97.7 F (36.5 C)     Temp Source 10/08/17 1025 Oral     SpO2 10/08/17 1025 96 %     Weight 10/08/17 1026 220 lb (99.8 kg)     Height 10/08/17 1026 6' (1.829 m)     Head Circumference --      Peak Flow --      Pain Score 10/08/17 1026 10     Pain Loc --      Pain Edu? --      Excl. in [place]? --    Constitutional: Alert and oriented. Well appearing and in no acute distress. Cardiovascular: Normal rate, regular rhythm. Grossly normal heart sounds.  Good peripheral circulation. Respiratory: Normal respiratory effort.  No retractions. Lungs CTAB. Musculoskeletal: No obvious deformity  to the left wrist.  Patient has full range of motion.  Patient is tender to palpation at the MCP joint of the first digit left hand.  L No joint effusions. Neurologic:  Normal speech and language. No gross focal neurologic deficits are appreciated. No gait instability. Skin:  Skin is warm, dry and intact. No rash noted. Psychiatric: Mood and affect are normal. Speech and behavior are normal.  ____________________________________________   LABS (all labs ordered are listed, but only abnormal results are displayed)  Labs Reviewed - No data to display ____________________________________________  EKG   ____________________________________________  RADIOLOGY  ED MD interpretation:    Official radiology report(s): Dg Wrist Complete Left  Result Date: 10/08/2017 CLINICAL DATA:  Wrist pain for 1 month, no known injury, initial encounter EXAM: LEFT WRIST - COMPLETE 3+ VIEW COMPARISON:  None. FINDINGS: No acute fracture or dislocation is noted. Some chondral calcifications are noted at the ulnar carpal joint. No soft tissue abnormality is seen. IMPRESSION: No acute  abnormality noted. Electronically Signed   By: Inez Catalina M.D.   On: 10/08/2017 11:08    ____________________________________________   PROCEDURES  Procedure(s) performed: None  .Splint Application Date/Time: 7/0/9628 11:40 AM Performed by: Suszanne Conners, NT Authorized by: Sable Feil, PA-C   Consent:    Consent obtained:  Verbal   Consent given by:  Patient   Risks discussed:  Numbness, pain and swelling Pre-procedure details:    Sensation:  Normal Procedure details:    Laterality:  Left   Location:  Finger   Finger:  L thumb   Splint type:  Thumb spica   Supplies:  Prefabricated splint Post-procedure details:    Pain:  Unchanged   Sensation:  Normal   Patient tolerance of procedure:  Tolerated well, no immediate complications    Critical Care performed: No  ____________________________________________   INITIAL IMPRESSION / ASSESSMENT AND PLAN / ED COURSE  As part of my medical decision making, I reviewed the following data within the electronic MEDICAL RECORD NUMBER    Left wrist and thumb pain secondary to tenosynovitis.  Discussed negative x-ray findings with patient.  Patient placed in a thumb spica splint given discharge care instruction.  Patient advised with her PCP for continued care.      ____________________________________________   FINAL CLINICAL IMPRESSION(S) / ED DIAGNOSES  Final diagnoses:  Tenosynovitis of thumb     ED Discharge Orders        Ordered    methylPREDNISolone (MEDROL DOSEPAK) 4 MG TBPK tablet     10/08/17 1120    traMADol (ULTRAM) 50 MG tablet  Every 12 hours PRN     10/08/17 1120       Note:  This document was prepared using Dragon voice recognition software and may include unintentional dictation errors.    Sable Feil, PA-C 10/08/17 1127    Sable Feil, PA-C 10/08/17 1141    Delman Kitten, MD 10/08/17 [phone]

## 2017-10-12 ENCOUNTER — Ambulatory Visit
Payer: Medicaid Other | Attending: Student in an Organized Health Care Education/Training Program | Admitting: Student in an Organized Health Care Education/Training Program

## 2017-10-12 ENCOUNTER — Other Ambulatory Visit: Payer: Self-pay

## 2017-10-12 ENCOUNTER — Encounter: Payer: Self-pay | Admitting: Student in an Organized Health Care Education/Training Program

## 2017-10-12 VITALS — BP 140/79 | HR 70 | Temp 98.0°F | Resp 16 | Ht 72.0 in | Wt 230.0 lb

## 2017-10-12 DIAGNOSIS — G43909 Migraine, unspecified, not intractable, without status migrainosus: Secondary | ICD-10-CM | POA: Diagnosis not present

## 2017-10-12 DIAGNOSIS — I509 Heart failure, unspecified: Secondary | ICD-10-CM | POA: Diagnosis not present

## 2017-10-12 DIAGNOSIS — E782 Mixed hyperlipidemia: Secondary | ICD-10-CM | POA: Diagnosis not present

## 2017-10-12 DIAGNOSIS — E039 Hypothyroidism, unspecified: Secondary | ICD-10-CM | POA: Insufficient documentation

## 2017-10-12 DIAGNOSIS — G4733 Obstructive sleep apnea (adult) (pediatric): Secondary | ICD-10-CM | POA: Diagnosis not present

## 2017-10-12 DIAGNOSIS — M545 Low back pain: Secondary | ICD-10-CM

## 2017-10-12 DIAGNOSIS — K922 Gastrointestinal hemorrhage, unspecified: Secondary | ICD-10-CM | POA: Insufficient documentation

## 2017-10-12 DIAGNOSIS — J449 Chronic obstructive pulmonary disease, unspecified: Secondary | ICD-10-CM | POA: Insufficient documentation

## 2017-10-12 DIAGNOSIS — I480 Paroxysmal atrial fibrillation: Secondary | ICD-10-CM | POA: Insufficient documentation

## 2017-10-12 DIAGNOSIS — E785 Hyperlipidemia, unspecified: Secondary | ICD-10-CM | POA: Diagnosis not present

## 2017-10-12 DIAGNOSIS — K219 Gastro-esophageal reflux disease without esophagitis: Secondary | ICD-10-CM | POA: Diagnosis not present

## 2017-10-12 DIAGNOSIS — G894 Chronic pain syndrome: Secondary | ICD-10-CM | POA: Insufficient documentation

## 2017-10-12 DIAGNOSIS — I25810 Atherosclerosis of coronary artery bypass graft(s) without angina pectoris: Secondary | ICD-10-CM | POA: Insufficient documentation

## 2017-10-12 DIAGNOSIS — G8929 Other chronic pain: Secondary | ICD-10-CM

## 2017-10-12 DIAGNOSIS — M47816 Spondylosis without myelopathy or radiculopathy, lumbar region: Secondary | ICD-10-CM | POA: Diagnosis not present

## 2017-10-12 DIAGNOSIS — F329 Major depressive disorder, single episode, unspecified: Secondary | ICD-10-CM | POA: Diagnosis not present

## 2017-10-12 DIAGNOSIS — Z955 Presence of coronary angioplasty implant and graft: Secondary | ICD-10-CM | POA: Diagnosis not present

## 2017-10-12 DIAGNOSIS — Z79899 Other long term (current) drug therapy: Secondary | ICD-10-CM | POA: Insufficient documentation

## 2017-10-12 DIAGNOSIS — M5136 Other intervertebral disc degeneration, lumbar region: Secondary | ICD-10-CM | POA: Insufficient documentation

## 2017-10-12 DIAGNOSIS — N289 Disorder of kidney and ureter, unspecified: Secondary | ICD-10-CM | POA: Diagnosis not present

## 2017-10-12 DIAGNOSIS — Z951 Presence of aortocoronary bypass graft: Secondary | ICD-10-CM | POA: Diagnosis not present

## 2017-10-12 DIAGNOSIS — M549 Dorsalgia, unspecified: Secondary | ICD-10-CM | POA: Diagnosis present

## 2017-10-12 DIAGNOSIS — M546 Pain in thoracic spine: Secondary | ICD-10-CM

## 2017-10-12 MED ORDER — TIZANIDINE HCL 4 MG PO TABS: 4.0000 mg | ORAL_TABLET | Freq: Two times a day (BID) | ORAL | 1 refills | Status: DC | PRN

## 2017-10-12 NOTE — Progress Notes (Signed)
Safety precautions to be maintained throughout the outpatient stay will include: orient to surroundings, keep bed in low position, maintain call bell within reach at all times, provide assistance with transfer out of bed and ambulation.   Brace on left hand for tendonitis.

## 2017-10-12 NOTE — Patient Instructions (Signed)
1. Stop Robaxin. Can try Tizanidine as needed 2. Xray imaging studies 3. Follow up in 2 weeks

## 2017-10-12 NOTE — Progress Notes (Addendum)
Patient's Name: Bryce Haney  MRN: [id number]  Referring Provider: Letta Median, MD  DOB: Jun 08, 1950  PCP: Letta Median, MD  DOS: 10/12/2017  Note by: Gillis Santa, MD  Service setting: Ambulatory outpatient  Specialty: Interventional Pain Management  Location: [facility] (AMB) Pain Management Facility  Visit type: Initial Patient Evaluation  Patient type: New Patient   Primary Reason(s) for Visit: Encounter for initial evaluation of one or more chronic problems (new to examiner) potentially causing chronic pain, and posing a threat to normal musculoskeletal function. (Level of risk: High) CC: Back Pain (mid to low back)  HPI  Bryce Haney is a 67 y.o. year old, male patient, who comes today to see [place] for the first time for an initial evaluation of his chronic pain. He has Pneumonia; Lower GI bleed; Leukocytosis; Diverticulitis large intestine; Acute renal insufficiency; Hyponatremia; CAD (coronary artery disease); Chronic depression; Coronary artery disease involving autologous artery coronary bypass graft; GERD (gastroesophageal reflux disease); Hypothyroidism, unspecified; Mixed hyperlipidemia; Paroxysmal A-fib ([place]); Phlebitis and thrombophlebitis of superficial vessels of lower extremities, bilateral; S/P CABG x 2; STEMI (ST elevation myocardial infarction) ([place]); and Special screening for malignant neoplasms, colon on their problem list. Today he comes in for evaluation of his Back Pain (mid to low back)  Pain Assessment: Location: Mid, Lower Back Radiating: The pain radiates up towards chest and both arms. Onset: More than a month ago Duration: Chronic pain Quality: Aching, Sharp, Shooting Severity: 5 /10 (subjective, self-reported pain score)  Note: Reported level is compatible with observation.                         When using our objective Pain Scale, levels between 6 and 10/10 are said to belong in an emergency room, as it progressively worsens from a 6/10, described as  severely limiting, requiring emergency care not usually available at an outpatient pain management facility. At a 6/10 level, communication becomes difficult and requires great effort. Assistance to reach the emergency department may be required. Facial flushing and profuse sweating along with potentially dangerous increases in heart rate and blood pressure will be evident. Effect on ADL: have to pace self. Timing: Intermittent Modifying factors: biofeedback BP: 140/79  HR: 70  Onset and Duration: Present longer than 3 months Cause of pain: Arthritis Severity: Getting worse, NAS-11 at its worse: 10/10, NAS-11 at its best: 1/10, NAS-11 now: 1/10 and NAS-11 on the average: 8/10 Timing: Not influenced by the time of the day and After activity or exercise Aggravating Factors: Bending, Lifiting, Motion, Nerve blocks, Prolonged standing, Squatting, Stooping , Twisting and Walking Alleviating Factors: Biofeedback, Hot packs, Lying down, Resting, Sitting, Relaxation therapy and Warm showers or baths Associated Problems: Depression, Dizziness, Fatigue, Spasms, Pain that wakes patient up and Pain that does not allow patient to sleep Quality of Pain: Aching, Agonizing, Annoying, Constant, Intermittent, Cramping, Cruel, Deep, Disabling, Distressing, Exhausting, Getting longer, Horrible, Hot, Nagging, Pressure-like, Pulsating, Punishing, Sharp, Shooting, Splitting, Stabbing, Tender, Throbbing, Tiring and Uncomfortable Previous Examinations or Tests: CT scan, Epidurogram, Nerve block and X-rays Previous Treatments: Biofeedback, Epidural steroid injections, Narcotic medications, Relaxation therapy and Trigger point injections  The patient comes into the clinics today for the first time for a chronic pain management evaluation.   67 year old male with a history of coronary artery disease, MI with stent in place, cardiac bypass surgery in March 2017 also with atrial fibrillation (currently not on any  anticoagulants or antiplatelets other than baby aspirin), migraine headaches,  COPD, OSA who presents with back pain that is pronounced in his cervical, thoracic and lumbar spine.  His most painful area is between his periscapular region along his thoracic spine.  It is painful to palpation.  Patient does have difficulty with movement.  Patient is currently on gabapentin 300 mg nightly.  Higher doses resulted in restless leg syndrome.  Patient also takes Robaxin 500 mg for his muscle spasms which he does not find effective.  Denies any bowel or bladder dysfunction.  Denies any radiating pain down his legs.  Not on chronic opioid therapy.  Tramadol was filled 50 mg, quantity 12 on 10/08/2017.  Today I took the time to provide the patient with information regarding my pain practice. The patient was informed that my practice is divided into two sections: an interventional pain management section, as well as a completely separate and distinct medication management section. I explained that I have procedure days for my interventional therapies, and evaluation days for follow-ups and medication management. Because of the amount of documentation required during both, they are kept separated. This means that there is the possibility that he may be scheduled for a procedure on one day, and medication management the next. I have also informed him that because of staffing and facility limitations, I no longer take patients for medication management only. To illustrate the reasons for this, I gave the patient the example of surgeons, and how inappropriate it would be to refer a patient to his/her care, just to write for the post-surgical antibiotics on a surgery done by a different surgeon.   Because interventional pain management is my board-certified specialty, the patient was informed that joining my practice means that they are open to any and all interventional therapies. I made it clear that this does not mean that  they will be forced to have any procedures done. What this means is that I believe interventional therapies to be essential part of the diagnosis and proper management of chronic pain conditions. Therefore, patients not interested in these interventional alternatives will be better served under the care of a different practitioner.  The patient was also made aware of my Comprehensive Pain Management Safety Guidelines where by joining my practice, they limit all of their nerve blocks and joint injections to those done by our practice, for as long as we are retained to manage their care.   Historic Controlled Substance Pharmacotherapy Review    [place] PMP: Six (6) year initial data search conducted.              [place] Department of public safety, offender search: Editor, commissioning Information) Non-contributory Risk Assessment Profile: Aberrant behavior: None observed or detected today Risk factors for fatal opioid overdose: None identified today Fatal overdose hazard ratio (HR): Calculation deferred Non-fatal overdose hazard ratio (HR): Calculation deferred Risk of opioid abuse or dependence: 0.7-3.0% with doses ? 36 MME/day and 6.1-26% with doses ? 120 MME/day. Substance use disorder (SUD) risk level: See below Opioid risk tool (ORT) (Total Score):    ORT Scoring interpretation table:  Score <3 = Low Risk for SUD  Score between 4-7 = Moderate Risk for SUD  Score >8 = High Risk for Opioid Abuse   PHQ-2 Depression Scale:  Total score: 0  PHQ-2 Scoring interpretation table: (Score and probability of major depressive disorder)  Score 0 = No depression  Score 1 = 15.4% Probability  Score 2 = 21.1% Probability  Score 3 = 38.4% Probability  Score 4 = 45.5% Probability  Score 5 = 56.4% Probability  Score 6 = 78.6% Probability   PHQ-9 Depression Scale:  Total score: 0  PHQ-9 Scoring interpretation table:  Score 0-4 = No depression  Score 5-9 = Mild depression  Score 10-14 = Moderate depression  Score  15-19 = Moderately severe depression  Score 20-27 = Severe depression (2.4 times higher risk of SUD and 2.89 times higher risk of overuse)   Pharmacologic Plan: As per protocol, I have not taken over any controlled substance management, pending the results of ordered tests and/or consults.            Initial impression: Pending review of available data and ordered tests.  Meds   Current Outpatient Medications:  .  acetaminophen (TYLENOL) 325 MG tablet, Take 1 tablet (325 mg total) by mouth every 6 (six) hours as needed for mild pain (or Fever >/= 101)., Disp: , Rfl:  .  aspirin 81 MG tablet, Take 81 mg by mouth daily., Disp: , Rfl:  .  atorvastatin (LIPITOR) 40 MG tablet, Take 40 mg by mouth daily., Disp: , Rfl:  .  furosemide (LASIX) 20 MG tablet, Take 20 mg by mouth 2 (two) times daily., Disp: , Rfl:  .  gabapentin (NEURONTIN) 300 MG capsule, Take 300 mg by mouth at bedtime. , Disp: , Rfl:  .  levothyroxine (SYNTHROID, LEVOTHROID) 150 MCG tablet, Take 150 mcg by mouth daily before breakfast., Disp: , Rfl:  .  methocarbamol (ROBAXIN) 500 MG tablet, Take 500 mg by mouth every 8 (eight) hours as needed for muscle spasms., Disp: , Rfl:  .  methylPREDNISolone (MEDROL DOSEPAK) 4 MG TBPK tablet, Take Tapered dose as directed, Disp: 21 tablet, Rfl: 0 .  metoprolol tartrate (LOPRESSOR) 25 MG tablet, Take 12.5 mg by mouth 2 (two) times daily., Disp: , Rfl:  .  mirtazapine (REMERON) 30 MG tablet, Take 30 mg by mouth at bedtime., Disp: , Rfl:  .  omeprazole (PRILOSEC) 20 MG capsule, Take 20 mg by mouth daily., Disp: , Rfl:  .  ranitidine (ZANTAC) 150 MG tablet, Take by mouth., Disp: , Rfl:  .  traMADol (ULTRAM) 50 MG tablet, Take 1 tablet (50 mg total) by mouth every 12 (twelve) hours as needed., Disp: 12 tablet, Rfl: 0 .  ciprofloxacin (CIPRO) 500 MG tablet, Take 1 tablet (500 mg total) by mouth 2 (two) times daily. (Patient not taking: Reported on 12/30/2016), Disp: 7 tablet, Rfl: 0 .  metroNIDAZOLE  (FLAGYL) 500 MG tablet, Take 1 tablet (500 mg total) by mouth every 8 (eight) hours. (Patient not taking: Reported on 12/30/2016), Disp: 10 tablet, Rfl: 0 .  nicotine (NICODERM CQ - DOSED IN MG/24 HOURS) 21 mg/24hr patch, Place 1 patch (21 mg total) onto the skin daily. (Patient not taking: Reported on 12/30/2016), Disp: 28 patch, Rfl: 0 .  oxyCODONE-acetaminophen (ROXICET) 5-325 MG tablet, Take 1 tablet by mouth every 8 (eight) hours as needed. (Patient not taking: Reported on 12/30/2016), Disp: 15 tablet, Rfl: 0 .  tiZANidine (ZANAFLEX) 4 MG tablet, Take 1 tablet (4 mg total) by mouth 2 (two) times daily as needed for muscle spasms., Disp: 60 tablet, Rfl: 1  Imaging Review  Wrist-L DG Complete:  Results for orders placed during the hospital encounter of 10/08/17  DG Wrist Complete Left   Narrative CLINICAL DATA:  Wrist pain for 1 month, no known injury, initial encounter  EXAM: LEFT WRIST - COMPLETE 3+ VIEW  COMPARISON:  None.  FINDINGS: No acute fracture or dislocation is noted.  Some chondral calcifications are noted at the ulnar carpal joint. No soft tissue abnormality is seen.  IMPRESSION: No acute abnormality noted.   Electronically Signed   By: Inez Catalina M.D.   On: 10/08/2017 11:08     Complexity Note: Imaging results reviewed. Results shared with Bryce Haney, using Layman's terms.                         ROS  Cardiovascular: Heart trouble, Chest pain, Heart attack ( Date: 2014, PCS), Heart surgery, Heart failure, Heart catheterization, Blood thinners:  Anticoagulant and Needs antibiotics prior to dental procedures Pulmonary or Respiratory: Lung problems, Wheezing and difficulty taking a deep full breath (Asthma), Difficulty blowing air out (Emphysema), Shortness of breath, Smoking, Snoring  and Coughing up mucus (Bronchitis) Neurological: No reported neurological signs or symptoms such as seizures, abnormal skin sensations, urinary and/or fecal incontinence, being  born with an abnormal open spine and/or a tethered spinal cord Review of Past Neurological Studies: No results found for this or any previous visit. Psychological-Psychiatric: Depressed Gastrointestinal: Vomiting blood (Ulcers), Heartburn due to stomach pushing into lungs (Hiatal hernia) and Reflux or heatburn Genitourinary: No reported renal or genitourinary signs or symptoms such as difficulty voiding or producing urine, peeing blood, non-functioning kidney, kidney stones, difficulty emptying the bladder, difficulty controlling the flow of urine, or chronic kidney disease Hematological: Brusing easily and Bleeding easily Endocrine: High thyroid Rheumatologic: Joint aches and or swelling due to excess weight (Osteoarthritis) Musculoskeletal: Negative for myasthenia gravis, muscular dystrophy, multiple sclerosis or malignant hyperthermia Work History: Disabled  Allergies  Bryce Haney is allergic to morphine and related.  Laboratory Chemistry  Inflammation Markers (CRP: Acute Phase) (ESR: Chronic Phase) Lab Results  Component Value Date   ESRSEDRATE 27 (H) 09/07/2012   LATICACIDVEN 0.9 06/19/2015                         Rheumatology Markers No results found for: RF, ANA, LABURIC, URICUR, LYMEIGGIGMAB, LYMEABIGMQN, HLAB27                      Renal Function Markers Lab Results  Component Value Date   BUN 19 10/20/2016   CREATININE 1.03 10/20/2016   GFRAA >60 10/20/2016   GFRNONAA >60 10/20/2016                             Hepatic Function Markers Lab Results  Component Value Date   AST 17 10/19/2016   ALT 20 10/19/2016   ALBUMIN 4.3 10/19/2016   ALKPHOS 75 10/19/2016                        Electrolytes Lab Results  Component Value Date   NA 134 (L) 10/20/2016   K 4.2 10/20/2016   CL 108 10/20/2016   CALCIUM 8.5 (L) 10/20/2016                        Neuropathy Markers No results found for: VITAMINB12, FOLATE, HGBA1C, HIV                      Bone Pathology  Markers No results found for: [place], [id number], [id number], LD3570VX7, 25OHVITD1, 25OHVITD2, 25OHVITD3, TESTOFREE, TESTOSTERONE  Coagulation Parameters Lab Results  Component Value Date   INR 0.91 10/19/2016   LABPROT 12.2 10/19/2016   APTT 33 05/22/2015   PLT 225 10/21/2016                        Cardiovascular Markers Lab Results  Component Value Date   BNP 240 (H) 11/26/2012   CKTOTAL 107 11/27/2012   CKMB 11.6 (H) 11/27/2012   TROPONINI <0.03 10/20/2016   HGB 14.0 10/21/2016   HCT 41.2 10/21/2016                         CA Markers No results found for: CEA, CA125, LABCA2                      Note: Lab results reviewed.  [place]  Drug: Bryce Haney  reports that he does not use drugs. Alcohol:  reports that he does not drink alcohol. Tobacco:  reports that he has been smoking.  He has a 35.00 pack-year smoking history. He has never used smokeless tobacco. Medical:  has a past medical history of Atrial fibrillation ([place]), Back pain, CHF (congestive heart failure) ([place]), COPD (chronic obstructive pulmonary disease) ([place]), Hyperlipidemia, Myocardial infarction ([place]), S/P CABG (coronary artery bypass graft), and Thyroid disease. Family: family history includes Heart disease in his father and mother.  Past Surgical History:  Procedure Laterality Date  . CARDIAC CATHETERIZATION N/A 07/24/2014   Procedure: Left Heart Cath;  Surgeon: Corey Skains, MD;  Location: [facility];  Service: Cardiovascular;  Laterality: N/A;  . COLONOSCOPY WITH PROPOFOL N/A 12/30/2016   Procedure: COLONOSCOPY WITH PROPOFOL;  Surgeon: Lin Landsman, MD;  Location: [facility] ENDOSCOPY;  Service: Gastroenterology;  Laterality: N/A;  . CORONARY ANGIOPLASTY    . CORONARY ANGIOPLASTY WITH STENT PLACEMENT    . CORONARY ARTERY BYPASS GRAFT    . JOINT REPLACEMENT  2010   right knee replacement   . JOINT REPLACEMENT  2016   left knee replacement  . SHOULDER SURGERY Right     Active Ambulatory Problems    Diagnosis Date Noted  . Pneumonia 06/19/2015  . Lower GI bleed 10/19/2016  . Leukocytosis 10/19/2016  . Diverticulitis large intestine 10/19/2016  . Acute renal insufficiency 10/19/2016  . Hyponatremia 10/19/2016  . CAD (coronary artery disease) 11/01/2012  . Chronic depression 05/23/2015  . Coronary artery disease involving autologous artery coronary bypass graft 06/30/2016  . GERD (gastroesophageal reflux disease) 05/23/2015  . Hypothyroidism, unspecified 10/25/2012  . Mixed hyperlipidemia 11/01/2012  . Paroxysmal A-fib ([place]) 07/08/2015  . Phlebitis and thrombophlebitis of superficial vessels of lower extremities, bilateral 06/05/2015  . S/P CABG x 2 05/29/2015  . STEMI (ST elevation myocardial infarction) ([place]) 10/25/2012  . Special screening for malignant neoplasms, colon    Resolved Ambulatory Problems    Diagnosis Date Noted  . No Resolved Ambulatory Problems   Past Medical History:  Diagnosis Date  . Atrial fibrillation ([place])   . Back pain   . CHF (congestive heart failure) ([place])   . COPD (chronic obstructive pulmonary disease) ([place])   . Hyperlipidemia   . Myocardial infarction ([place])   . S/P CABG (coronary artery bypass graft)   . Thyroid disease    Constitutional Exam  General appearance: Well nourished, well developed, and well hydrated. In no apparent acute distress Vitals:   10/12/17 1408  BP: 140/79  Pulse: 70  Resp: 16  Temp:  98 F (36.7 C)  TempSrc: Oral  SpO2: 95%  Weight: 230 lb (104.3 kg)  Height: 6' (1.829 m)   BMI Assessment: Estimated body mass index is 31.19 kg/m as calculated from the following:   Height as of this encounter: 6' (1.829 m).   Weight as of this encounter: 230 lb (104.3 kg).  BMI interpretation table: BMI level Category Range association with higher incidence of chronic pain  <18 kg/m2 Underweight   18.5-24.9 kg/m2 Ideal body weight   25-29.9 kg/m2 Overweight Increased incidence by 20%   30-34.9 kg/m2 Obese (Class I) Increased incidence by 68%  35-39.9 kg/m2 Severe obesity (Class II) Increased incidence by 136%  >40 kg/m2 Extreme obesity (Class III) Increased incidence by 254%   Patient's current BMI Ideal Body weight  Body mass index is 31.19 kg/m. Ideal body weight: 77.6 kg (171 lb 1.2 oz) Adjusted ideal body weight: 88.3 kg (194 lb 10.3 oz)   BMI Readings from Last 4 Encounters:  10/12/17 31.19 kg/m  10/08/17 29.84 kg/m  12/30/16 29.84 kg/m  10/28/16 29.32 kg/m   Wt Readings from Last 4 Encounters:  10/12/17 230 lb (104.3 kg)  10/08/17 220 lb (99.8 kg)  12/30/16 220 lb (99.8 kg)  10/28/16 216 lb 3.2 oz (98.1 kg)  Psych/Mental status: Alert, oriented x 3 (person, place, & time)       Eyes: PERLA Respiratory: No evidence of acute respiratory distress  Cervical Spine Area Exam  Skin & Axial Inspection: No masses, redness, edema, swelling, or associated skin lesions Alignment: Symmetrical Functional ROM: Unrestricted ROM      Stability: No instability detected Muscle Tone/Strength: Functionally intact. No obvious neuro-muscular anomalies detected. Sensory (Neurological): Unimpaired Palpation: No palpable anomalies              Upper Extremity (UE) Exam    Side: Right upper extremity  Side: Left upper extremity  Skin & Extremity Inspection: Skin color, temperature, and hair growth are WNL. No peripheral edema or cyanosis. No masses, redness, swelling, asymmetry, or associated skin lesions. No contractures.  Skin & Extremity Inspection: Skin color, temperature, and hair growth are WNL. No peripheral edema or cyanosis. No masses, redness, swelling, asymmetry, or associated skin lesions. No contractures.  Functional ROM: Unrestricted ROM          Functional ROM: Unrestricted ROM          Muscle Tone/Strength: Functionally intact. No obvious neuro-muscular anomalies detected.  Muscle Tone/Strength: Functionally intact. No obvious neuro-muscular anomalies  detected.  Sensory (Neurological): Unimpaired          Sensory (Neurological): Unimpaired          Palpation: No palpable anomalies              Palpation: No palpable anomalies              Provocative Test(s):  Phalen's test: deferred Tinel's test: deferred Apley's scratch test (touch opposite shoulder):  Action 1 (Across chest): Decreased ROM Action 2 (Overhead): Decreased ROM Action 3 (LB reach): Decreased ROM   Provocative Test(s):  Phalen's test: deferred Tinel's test: deferred Apley's scratch test (touch opposite shoulder):  Action 1 (Across chest): Decreased ROM Action 2 (Overhead): Decreased ROM Action 3 (LB reach): Decreased ROM    Thoracic Spine Area Exam  Skin & Axial Inspection: No masses, redness, or swelling Alignment: Symmetrical Functional ROM: Decreased ROM Stability: No instability detected Muscle Tone/Strength: Functionally intact. No obvious neuro-muscular anomalies detected. Sensory (Neurological): Musculoskeletal pain pattern Muscle strength & Tone:  Complains of area being tender to palpation  Lumbar Spine Area Exam  Skin & Axial Inspection: No masses, redness, or swelling Alignment: Symmetrical Functional ROM: Decreased ROM affecting both sides Stability: No instability detected Muscle Tone/Strength: Functionally intact. No obvious neuro-muscular anomalies detected. Sensory (Neurological): Musculoskeletal pain pattern Palpation: Complains of area being tender to palpation       Provocative Tests: Hyperextension/rotation test: (+) bilaterally for facet joint pain. Lumbar quadrant test (Kemp's test): (+) bilaterally for facet joint pain. Lateral bending test: deferred today       Patrick's Maneuver: deferred today                   FABER test: deferred today                   S-I anterior distraction/compression test: deferred today         S-I lateral compression test: deferred today         S-I Thigh-thrust test: deferred today         S-I  Gaenslen's test: deferred today           Gait & Posture Assessment  Ambulation: Limited Gait: Antalgic Posture: WNL   Lower Extremity Exam    Side: Right lower extremity  Side: Left lower extremity  Stability: No instability observed          Stability: No instability observed          Skin & Extremity Inspection: Skin color, temperature, and hair growth are WNL. No peripheral edema or cyanosis. No masses, redness, swelling, asymmetry, or associated skin lesions. No contractures.  Skin & Extremity Inspection: Skin color, temperature, and hair growth are WNL. No peripheral edema or cyanosis. No masses, redness, swelling, asymmetry, or associated skin lesions. No contractures.  Functional ROM: Unrestricted ROM                  Functional ROM: Unrestricted ROM                  Muscle Tone/Strength: Functionally intact. No obvious neuro-muscular anomalies detected.  Muscle Tone/Strength: Functionally intact. No obvious neuro-muscular anomalies detected.  Sensory (Neurological): Unimpaired  Sensory (Neurological): Unimpaired  Palpation: No palpable anomalies  Palpation: No palpable anomalies   Assessment  Primary Diagnosis & Pertinent Problem List: The primary encounter diagnosis was Chronic bilateral low back pain without sciatica. Diagnoses of Lumbar degenerative disc disease, Lumbar facet joint syndrome, S/P CABG x 2, Paroxysmal A-fib (HCC), Chronic pain syndrome, and Chronic thoracic spine pain were also pertinent to this visit.  Visit Diagnosis (New problems to examiner): 1. Chronic bilateral low back pain without sciatica   2. Lumbar degenerative disc disease   3. Lumbar facet joint syndrome   4. S/P CABG x 2   5. Paroxysmal A-fib ([place])   6. Chronic pain syndrome   7. Chronic thoracic spine pain    67 year old male with a history of coronary artery disease, MI with stent in place, cardiac bypass surgery in March 2017 also with atrial fibrillation (currently not on any anticoagulants  or antiplatelets other than baby aspirin), migraine headaches, COPD, OSA who presents with back pain that is pronounced in his cervical, thoracic and lumbar spine.  His most painful area is between his periscapular region along his thoracic spine.  It is painful to palpation.  Patient does have difficulty with movement.  Patient is currently on gabapentin 300 mg nightly.  Higher doses resulted in restless leg syndrome.  Patient  also takes Robaxin 500 mg for his muscle spasms which he does not find effective.  Denies any bowel or bladder dysfunction.  Denies any radiating pain down his legs.  Not on chronic opioid therapy.  Tramadol was filled 50 mg, quantity 12 on 10/08/2017.  Given that Robaxin is not very effective, will trial tizanidine as below.  We will also obtain cervical, thoracic, lumbar spine x-rays to evaluate for spondylosis, arthropathy, degenerative changes.  Pending x-rays we will discuss treatment plan with patient which could include thoracic and or lumbar facet medial branch nerve blocks and possible radio frequency ablation.  We will focus on non-opioid-based pain management given the patient's impressive cardiopulmonary history as well as morbid obesity with obstructive sleep apnea as well as COPD.  Plan of Care (Initial workup plan)  Note: Please be advised that as per protocol, today's visit has been an evaluation only. We have not taken over the patient's controlled substance management.  Ordered Lab-work, Procedure(s), Referral(s), & Consult(s): Orders Placed This Encounter  Procedures  . DG Thoracic Spine 2 View  . DG Cervical Spine With Flex & Extend  . DG Lumbar Spine Complete W/Bend  . Compliance Drug Analysis, Ur   Pharmacotherapy (current): Medications ordered:  Meds ordered this encounter  Medications  . tiZANidine (ZANAFLEX) 4 MG tablet    Sig: Take 1 tablet (4 mg total) by mouth 2 (two) times daily as needed for muscle spasms.    Dispense:  60 tablet    Refill:   1   Medications administered during this visit: Bryce Haney had no medications administered during this visit.   Pharmacological management options:  Opioid Analgesics: The patient was informed that there is no guarantee that he would be a candidate for opioid analgesics. The decision will be made following CDC guidelines. This decision will be based on the results of diagnostic studies, as well as Bryce Haney risk profile.   Membrane stabilizer: To be determined at a later time  Muscle relaxant: To be determined at a later time  NSAID: To be determined at a later time  Other analgesic(s): To be determined at a later time   Interventional management options: Bryce Haney was informed that there is no guarantee that he would be a candidate for interventional therapies. The decision will be based on the results of diagnostic studies, as well as Bryce Haney's risk profile.  Procedure(s) under consideration:  Thoracic lumbar facet medial branch nerve blocks with possible RFA Lumbar facet medial branch nerve blocks with possible RFA Trigger point injections   Provider-requested follow-up: Return in about 2 weeks (around 10/26/2017) for After Imaging.  Future Appointments  Date Time Provider Young  10/27/2017 12:00 PM Gillis Santa, MD [facility] None    Primary Care Physician: Letta Median, MD Location: [facility] Note by: Gillis Santa, M.D, Date: 10/12/2017; Time: 4:03 PM  Patient Instructions  1. Stop Robaxin. Can try Tizanidine as needed 2. Xray imaging studies 3. Follow up in 2 weeks

## 2017-10-18 ENCOUNTER — Ambulatory Visit
Admission: RE | Admit: 2017-10-18 | Discharge: 2017-10-18 | Disposition: A | Discharge status: Home / Self Care | Payer: Medicaid Other | Source: Ambulatory Visit | Attending: Student in an Organized Health Care Education/Training Program | Admitting: Student in an Organized Health Care Education/Training Program

## 2017-10-18 DIAGNOSIS — M5137 Other intervertebral disc degeneration, lumbosacral region: Secondary | ICD-10-CM | POA: Insufficient documentation

## 2017-10-18 DIAGNOSIS — M47894 Other spondylosis, thoracic region: Secondary | ICD-10-CM | POA: Diagnosis not present

## 2017-10-18 DIAGNOSIS — M545 Low back pain, unspecified: Secondary | ICD-10-CM

## 2017-10-18 DIAGNOSIS — M47892 Other spondylosis, cervical region: Secondary | ICD-10-CM | POA: Insufficient documentation

## 2017-10-18 DIAGNOSIS — M546 Pain in thoracic spine: Secondary | ICD-10-CM | POA: Insufficient documentation

## 2017-10-18 DIAGNOSIS — G8929 Other chronic pain: Secondary | ICD-10-CM | POA: Insufficient documentation

## 2017-10-18 LAB — COMPLIANCE DRUG ANALYSIS, UR

## 2017-10-27 ENCOUNTER — Encounter: Payer: Self-pay | Admitting: Student in an Organized Health Care Education/Training Program

## 2017-10-27 ENCOUNTER — Ambulatory Visit
Payer: Medicaid Other | Attending: Student in an Organized Health Care Education/Training Program | Admitting: Student in an Organized Health Care Education/Training Program

## 2017-10-27 ENCOUNTER — Other Ambulatory Visit: Payer: Self-pay

## 2017-10-27 ENCOUNTER — Telehealth: Payer: Self-pay | Admitting: *Deleted

## 2017-10-27 VITALS — BP 116/85 | HR 71 | Temp 98.0°F | Resp 16 | Ht 72.0 in | Wt 210.0 lb

## 2017-10-27 DIAGNOSIS — J449 Chronic obstructive pulmonary disease, unspecified: Secondary | ICD-10-CM | POA: Insufficient documentation

## 2017-10-27 DIAGNOSIS — E782 Mixed hyperlipidemia: Secondary | ICD-10-CM | POA: Insufficient documentation

## 2017-10-27 DIAGNOSIS — M47812 Spondylosis without myelopathy or radiculopathy, cervical region: Secondary | ICD-10-CM | POA: Insufficient documentation

## 2017-10-27 DIAGNOSIS — I252 Old myocardial infarction: Secondary | ICD-10-CM | POA: Diagnosis not present

## 2017-10-27 DIAGNOSIS — G894 Chronic pain syndrome: Secondary | ICD-10-CM | POA: Diagnosis not present

## 2017-10-27 DIAGNOSIS — Z8701 Personal history of pneumonia (recurrent): Secondary | ICD-10-CM | POA: Insufficient documentation

## 2017-10-27 DIAGNOSIS — M5030 Other cervical disc degeneration, unspecified cervical region: Secondary | ICD-10-CM | POA: Diagnosis not present

## 2017-10-27 DIAGNOSIS — Z955 Presence of coronary angioplasty implant and graft: Secondary | ICD-10-CM | POA: Diagnosis not present

## 2017-10-27 DIAGNOSIS — Z951 Presence of aortocoronary bypass graft: Secondary | ICD-10-CM | POA: Diagnosis not present

## 2017-10-27 DIAGNOSIS — F329 Major depressive disorder, single episode, unspecified: Secondary | ICD-10-CM | POA: Insufficient documentation

## 2017-10-27 DIAGNOSIS — F17200 Nicotine dependence, unspecified, uncomplicated: Secondary | ICD-10-CM | POA: Diagnosis not present

## 2017-10-27 DIAGNOSIS — I2510 Atherosclerotic heart disease of native coronary artery without angina pectoris: Secondary | ICD-10-CM | POA: Diagnosis not present

## 2017-10-27 DIAGNOSIS — M47816 Spondylosis without myelopathy or radiculopathy, lumbar region: Secondary | ICD-10-CM

## 2017-10-27 DIAGNOSIS — E039 Hypothyroidism, unspecified: Secondary | ICD-10-CM | POA: Diagnosis not present

## 2017-10-27 DIAGNOSIS — Z86718 Personal history of other venous thrombosis and embolism: Secondary | ICD-10-CM | POA: Insufficient documentation

## 2017-10-27 DIAGNOSIS — K219 Gastro-esophageal reflux disease without esophagitis: Secondary | ICD-10-CM | POA: Diagnosis not present

## 2017-10-27 DIAGNOSIS — I480 Paroxysmal atrial fibrillation: Secondary | ICD-10-CM | POA: Diagnosis not present

## 2017-10-27 MED ORDER — HYDROCODONE-ACETAMINOPHEN 5-325 MG PO TABS: 1.0000 | ORAL_TABLET | Freq: Every day | ORAL | 0 refills | Status: DC | PRN

## 2017-10-27 NOTE — Patient Instructions (Addendum)
Sign opioid contract, prescription sent to pharmacy.  Your escribed hydrocodone with acetaminophen will last until 12/26/2017.  Facet Blocks Patient Information  Description: The facets are joints in the spine between the vertebrae.  Like any joints in the body, facets can become irritated and painful.  Arthritis can also effect the facets.  By injecting steroids and local anesthetic in and around these joints, we can temporarily block the nerve supply to them.  Steroids act directly on irritated nerves and tissues to reduce selling and inflammation which often leads to decreased pain.  Facet blocks may be done anywhere along the spine from the neck to the low back depending upon the location of your pain.   After numbing the skin with local anesthetic (like Novocaine), a small needle is passed onto the facet joints under x-ray guidance.  You may experience a sensation of pressure while this is being done.  The entire block usually lasts about 15-25 minutes.   Conditions which may be treated by facet blocks:   Low back/buttock pain  Neck/shoulder pain  Certain types of headaches  Preparation for the injection:  1. Do not eat any solid food or dairy products within 8 hours of your appointment. 2. You may drink clear liquid up to 3 hours before appointment.  Clear liquids include water, black coffee, juice or soda.  No milk or cream please. 3. You may take your regular medication, including pain medications, with a sip of water before your appointment.  Diabetics should hold regular insulin (if taken separately) and take 1/2 normal NPH dose the morning of the procedure.  Carry some sugar containing items with you to your appointment. 4. A driver must accompany you and be prepared to drive you home after your procedure. 5. Bring all your current medications with you. 6. An IV may be inserted and sedation may be given at the discretion of the physician. 7. A blood pressure cuff, EKG and other  monitors will often be applied during the procedure.  Some patients may need to have extra oxygen administered for a short period. 8. You will be asked to provide medical information, including your allergies and medications, prior to the procedure.  We must know immediately if you are taking blood thinners (like Coumadin/Warfarin) or if you are allergic to IV iodine contrast (dye).  We must know if you could possible be pregnant.  Possible side-effects:   Bleeding from needle site  Infection (rare, may require surgery)  Nerve injury (rare)  Numbness & tingling (temporary)  Difficulty urinating (rare, temporary)  Spinal headache (a headache worse with upright posture)  Light-headedness (temporary)  Pain at injection site (serveral days)  Decreased blood pressure (rare, temporary)  Weakness in arm/leg (temporary)  Pressure sensation in back/neck (temporary)   Call if you experience:   Fever/chills associated with headache or increased back/neck pain  Headache worsened by an upright position  New onset, weakness or numbness of an extremity below the injection site  Hives or difficulty breathing (go to the emergency room)  Inflammation or drainage at the injection site(s)  Severe back/neck pain greater than usual  New symptoms which are concerning to you  Please note:  Although the local anesthetic injected can often make your back or neck feel good for several hours after the injection, the pain will likely return. It takes 3-7 days for steroids to work.  You may not notice any pain relief for at least one week.  If effective, we will often do  a series of 2-3 injections spaced 3-6 weeks apart to maximally decrease your pain.  After the initial series, you may be a candidate for a more permanent nerve block of the facets.  If you have any questions, please call #[phone]) [facility] Moderate Conscious Sedation, Adult Sedation  is the use of medicines to promote relaxation and relieve discomfort and anxiety. Moderate conscious sedation is a type of sedation. Under moderate conscious sedation, you are less alert than normal, but you are still able to respond to instructions, touch, or both. Moderate conscious sedation is used during short medical and dental procedures. It is milder than deep sedation, which is a type of sedation under which you cannot be easily woken up. It is also milder than general anesthesia, which is the use of medicines to make you unconscious. Moderate conscious sedation allows you to return to your regular activities sooner. Tell a health care provider about:  Any allergies you have.  All medicines you are taking, including vitamins, herbs, eye drops, creams, and over-the-counter medicines.  Use of steroids (by mouth or creams).  Any problems you or family members have had with sedatives and anesthetic medicines.  Any blood disorders you have.  Any surgeries you have had.  Any medical conditions you have, such as sleep apnea.  Whether you are pregnant or may be pregnant.  Any use of cigarettes, alcohol, marijuana, or street drugs. What are the risks? Generally, this is a safe procedure. However, problems may occur, including:  Getting too much medicine (oversedation).  Nausea.  Allergic reaction to medicines.  Trouble breathing. If this happens, a breathing tube may be used to help with breathing. It will be removed when you are awake and breathing on your own.  Heart trouble.  Lung trouble.  What happens before the procedure? Staying hydrated Follow instructions from your health care provider about hydration, which may include:  Up to 2 hours before the procedure - you may continue to drink clear liquids, such as water, clear fruit juice, black coffee, and plain tea.  Eating and drinking restrictions Follow instructions from your health care provider about eating and  drinking, which may include:  8 hours before the procedure - stop eating heavy meals or foods such as meat, fried foods, or fatty foods.  6 hours before the procedure - stop eating light meals or foods, such as toast or cereal.  6 hours before the procedure - stop drinking milk or drinks that contain milk.  2 hours before the procedure - stop drinking clear liquids.  Medicine  Ask your health care provider about:  Changing or stopping your regular medicines. This is especially important if you are taking diabetes medicines or blood thinners.  Taking medicines such as aspirin and ibuprofen. These medicines can thin your blood. Do not take these medicines before your procedure if your health care provider instructs you not to.  Tests and exams  You will have a physical exam.  You may have blood tests done to show: ? How well your kidneys and liver are working. ? How well your blood can clot. General instructions  Plan to have someone take you home from the hospital or clinic.  If you will be going home right after the procedure, plan to have someone with you for 24 hours. What happens during the procedure?  An IV tube will be inserted into one of your veins.  Medicine to help you relax (sedative) will be given  through the IV tube.  The medical or dental procedure will be performed. What happens after the procedure?  Your blood pressure, heart rate, breathing rate, and blood oxygen level will be monitored often until the medicines you were given have worn off.  Do not drive for 24 hours. This information is not intended to replace advice given to you by your health care provider. Make sure you discuss any questions you have with your health care provider. Document Released: 11/17/2000 Document Revised: 07/29/2015 Document Reviewed: 06/14/2015 Elsevier Interactive Patient Education  2018 [place]  What are the risk, side effects and  possible complications? Generally speaking, most procedures are safe.  However, with any procedure there are risks, side effects, and the possibility of complications.  The risks and complications are dependent upon the sites that are lesioned, or the type of nerve block to be performed.  The closer the procedure is to the spine, the more serious the risks are.  Great care is taken when placing the radio frequency needles, block needles or lesioning probes, but sometimes complications can occur. 1. Infection: Any time there is an injection through the skin, there is a risk of infection.  This is why sterile conditions are used for these blocks.  There are four possible types of infection. 1. Localized skin infection. 2. Central Nervous System Infection-This can be in the form of Meningitis, which can be deadly. 3. Epidural Infections-This can be in the form of an epidural abscess, which can cause pressure inside of the spine, causing compression of the spinal cord with subsequent paralysis. This would require an emergency surgery to decompress, and there are no guarantees that the patient would recover from the paralysis. 4. Discitis-This is an infection of the intervertebral discs.  It occurs in about 1% of discography procedures.  It is difficult to treat and it may lead to surgery.        2. Pain: the needles have to go through skin and soft tissues, will cause soreness.       3. Damage to internal structures:  The nerves to be lesioned may be near blood vessels or    other nerves which can be potentially damaged.       4. Bleeding: Bleeding is more common if the patient is taking blood thinners such as  aspirin, Coumadin, Ticiid, Plavix, etc., or if he/she have some genetic predisposition  such as hemophilia. Bleeding into the spinal canal can cause compression of the spinal  cord with subsequent paralysis.  This would require an emergency surgery to  decompress and there are no guarantees that the  patient would recover from the  paralysis.       5. Pneumothorax:  Puncturing of a lung is a possibility, every time a needle is introduced in  the area of the chest or upper back.  Pneumothorax refers to free air around the  collapsed lung(s), inside of the thoracic cavity (chest cavity).  Another two possible  complications related to a similar event would include: Hemothorax and Chylothorax.   These are variations of the Pneumothorax, where instead of air around the collapsed  lung(s), you may have blood or chyle, respectively.       6. Spinal headaches: They may occur with any procedures in the area of the spine.       7. Persistent CSF (Cerebro-Spinal Fluid) leakage: This is a rare problem, but may occur  with prolonged intrathecal or epidural catheters either due  to the formation of a fistulous  track or a dural tear.       8. Nerve damage: By working so close to the spinal cord, there is always a possibility of  nerve damage, which could be as serious as a permanent spinal cord injury with  paralysis.       9. Death:  Although rare, severe deadly allergic reactions known as "Anaphylactic  reaction" can occur to any of the medications used.      10. Worsening of the symptoms:  We can always make thing worse.  What are the chances of something like this happening? Chances of any of this occuring are extremely low.  By statistics, you have more of a chance of getting killed in a motor vehicle accident: while driving to the hospital than any of the above occurring .  Nevertheless, you should be aware that they are possibilities.  In general, it is similar to taking a shower.  Everybody knows that you can slip, hit your head and get killed.  Does that mean that you should not shower again?  Nevertheless always keep in mind that statistics do not mean anything if you happen to be on the wrong side of them.  Even if a procedure has a 1 (one) in a 1,000,000 (million) chance of going wrong, it you happen to  be that one..Also, keep in mind that by statistics, you have more of a chance of having something go wrong when taking medications.  Who should not have this procedure? If you are on a blood thinning medication (e.g. Coumadin, Plavix, see list of "Blood Thinners"), or if you have an active infection going on, you should not have the procedure.  If you are taking any blood thinners, please inform your physician.  How should I prepare for this procedure?  Do not eat or drink anything at least six hours prior to the procedure.  Bring a driver with you .  It cannot be a taxi.  Come accompanied by an adult that can drive you back, and that is strong enough to help you if your legs get weak or numb from the local anesthetic.  Take all of your medicines the morning of the procedure with just enough water to swallow them.  If you have diabetes, make sure that you are scheduled to have your procedure done first thing in the morning, whenever possible.  If you have diabetes, take only half of your insulin dose and notify our nurse that you have done so as soon as you arrive at the clinic.  If you are diabetic, but only take blood sugar pills (oral hypoglycemic), then do not take them on the morning of your procedure.  You may take them after you have had the procedure.  Do not take aspirin or any aspirin-containing medications, at least eleven (11) days prior to the procedure.  They may prolong bleeding.  Wear loose fitting clothing that may be easy to take off and that you would not mind if it got stained with Betadine or blood.  Do not wear any jewelry or perfume  Remove any nail coloring.  It will interfere with some of our monitoring equipment.  NOTE: Remember that this is not meant to be interpreted as a complete list of all possible complications.  Unforeseen problems may occur.  BLOOD THINNERS The following drugs contain aspirin or other products, which can cause increased bleeding  during surgery and should not be taken for 2 weeks  prior to and 1 week after surgery.  If you should need take something for relief of minor pain, you may take acetaminophen which is found in Tylenol,m Datril, Anacin-3 and Panadol. It is not blood thinner. The products listed below are.  Do not take any of the products listed below in addition to any listed on your instruction sheet.  A.P.C or A.P.C with Codeine Codeine Phosphate Capsules #3 Ibuprofen Ridaura  ABC compound Congesprin Imuran rimadil  Advil Cope Indocin Robaxisal  Alka-Seltzer Effervescent Pain Reliever and Antacid Coricidin or Coricidin-D  Indomethacin Rufen  Alka-Seltzer plus Cold Medicine Cosprin Ketoprofen S-A-C Tablets  Anacin Analgesic Tablets or Capsules Coumadin Korlgesic Salflex  Anacin Extra Strength Analgesic tablets or capsules CP-2 Tablets Lanoril Salicylate  Anaprox Cuprimine Capsules Levenox Salocol  Anexsia-D Dalteparin Magan Salsalate  Anodynos Darvon compound Magnesium Salicylate Sine-off  Ansaid Dasin Capsules Magsal Sodium Salicylate  Anturane Depen Capsules Marnal Soma  APF Arthritis pain formula Dewitt's Pills Measurin Stanback  Argesic Dia-Gesic Meclofenamic Sulfinpyrazone  Arthritis Bayer Timed Release Aspirin Diclofenac Meclomen Sulindac  Arthritis pain formula Anacin Dicumarol Medipren Supac  Analgesic (Safety coated) Arthralgen Diffunasal Mefanamic Suprofen  Arthritis Strength Bufferin Dihydrocodeine Mepro Compound Suprol  Arthropan liquid Dopirydamole Methcarbomol with Aspirin Synalgos  ASA tablets/Enseals Disalcid Micrainin Tagament  Ascriptin Doan's Midol Talwin  Ascriptin A/D Dolene Mobidin Tanderil  Ascriptin Extra Strength Dolobid Moblgesic Ticlid  Ascriptin with Codeine Doloprin or Doloprin with Codeine Momentum Tolectin  Asperbuf Duoprin Mono-gesic Trendar  Aspergum Duradyne Motrin or Motrin IB Triminicin  Aspirin plain, buffered or enteric coated Durasal Myochrisine Trigesic  Aspirin  Suppositories Easprin Nalfon Trillsate  Aspirin with Codeine Ecotrin Regular or Extra Strength Naprosyn Uracel  Atromid-S Efficin Naproxen Ursinus  Auranofin Capsules Elmiron Neocylate Vanquish  Axotal Emagrin Norgesic Verin  Azathioprine Empirin or Empirin with Codeine Normiflo Vitamin E  Azolid Emprazil Nuprin Voltaren  Bayer Aspirin plain, buffered or children's or timed BC Tablets or powders Encaprin Orgaran Warfarin Sodium  Buff-a-Comp Enoxaparin Orudis Zorpin  Buff-a-Comp with Codeine Equegesic Os-Cal-Gesic   Buffaprin Excedrin plain, buffered or Extra Strength Oxalid   Bufferin Arthritis Strength Feldene Oxphenbutazone   Bufferin plain or Extra Strength Feldene Capsules Oxycodone with Aspirin   Bufferin with Codeine Fenoprofen Fenoprofen Pabalate or Pabalate-SF   Buffets II Flogesic Panagesic   Buffinol plain or Extra Strength Florinal or Florinal with Codeine Panwarfarin   Buf-Tabs Flurbiprofen Penicillamine   Butalbital Compound Four-way cold tablets Penicillin   Butazolidin Fragmin Pepto-Bismol   Carbenicillin Geminisyn Percodan   Carna Arthritis Reliever Geopen Persantine   Carprofen Gold's salt Persistin   Chloramphenicol Goody's Phenylbutazone   Chloromycetin Haltrain Piroxlcam   Clmetidine heparin Plaquenil   Cllnoril Hyco-pap Ponstel   Clofibrate Hydroxy chloroquine Propoxyphen         Before stopping any of these medications, be sure to consult the physician who ordered them.  Some, such as Coumadin (Warfarin) are ordered to prevent or treat serious conditions such as "deep thrombosis", "pumonary embolisms", and other heart problems.  The amount of time that you may need off of the medication may also vary with the medication and the reason for which you were taking it.  If you are taking any of these medications, please make sure you notify your pain physician before you undergo any procedures.

## 2017-10-27 NOTE — Telephone Encounter (Signed)
[facility] called and stated they could only fill hydrocodone - apap 5-325 mg x 7 days and that we will need to send PA and additional Rx to cover the remainder of medication.  Spoke with Dr Holley Raring, he will send new Rx to fill on 11/02/17 for qty 60 and new medication date will be 12/02/17.

## 2017-10-27 NOTE — Progress Notes (Signed)
Safety precautions to be maintained throughout the outpatient stay will include: orient to surroundings, keep bed in low position, maintain call bell within reach at all times, provide assistance with transfer out of bed and ambulation.  

## 2017-10-27 NOTE — Progress Notes (Signed)
Patient's Name: Bryce Haney  MRN: [id number]  Referring Provider: Letta Median, MD  DOB: Jul 10, 1950  PCP: Letta Median, MD  DOS: 10/27/2017  Note by: Gillis Santa, MD  Service setting: Ambulatory outpatient  Specialty: Interventional Pain Management  Location: [facility] (AMB) Pain Management Facility    Patient type: Established   Primary Reason(s) for Visit: Encounter for evaluation before starting new chronic pain management plan of care (Level of risk: moderate) CC: Wrist Pain (left ) and Back Pain (lower and upper)  HPI  Bryce Haney is a 66 y.o. year old, male patient, who comes today for a follow-up evaluation to review the test results and decide on a treatment plan. He has Pneumonia; Lower GI bleed; Leukocytosis; Diverticulitis large intestine; Acute renal insufficiency; Hyponatremia; CAD (coronary artery disease); Chronic depression; Coronary artery disease involving autologous artery coronary bypass graft; GERD (gastroesophageal reflux disease); Hypothyroidism, unspecified; Mixed hyperlipidemia; Paroxysmal A-fib ([place]); Phlebitis and thrombophlebitis of superficial vessels of lower extremities, bilateral; S/P CABG x 2; STEMI (ST elevation myocardial infarction) ([place]); Special screening for malignant neoplasms, colon; Spondylosis of cervical region without myelopathy or radiculopathy; DDD (degenerative disc disease), cervical; Cervical facet joint syndrome; and Chronic pain syndrome on their problem list. His primarily concern today is the Wrist Pain (left ) and Back Pain (lower and upper)  Pain Assessment: Location: Left Wrist Radiating: into left thumb Onset: More than a month ago Duration: Chronic pain Quality: Stabbing, Aching, Constant Severity: 6 /10 (subjective, self-reported pain score)  Note: Reported level is inconsistent with clinical observations.                         When using our objective Pain Scale, levels between 6 and 10/10 are said to belong in an  emergency room, as it progressively worsens from a 6/10, described as severely limiting, requiring emergency care not usually available at an outpatient pain management facility. At a 6/10 level, communication becomes difficult and requires great effort. Assistance to reach the emergency department may be required. Facial flushing and profuse sweating along with potentially dangerous increases in heart rate and blood pressure will be evident. Effect on ADL: not able to manipulate a doorknob or hold a glass Timing: Constant Modifying factors:   BP: 116/85  HR: 71  Bryce Haney comes in today for a follow-up visit after his initial evaluation on 10/12/2017. Today we went over the results of his tests. These were explained in "Layman's terms". During today's appointment we went over my diagnostic impression, as well as the proposed treatment plan.  Today we reviewed the patient's cervical spine, thoracic spine and lumbar spine x-rays.  Patient states that tizanidine was not very effective.  In considering the treatment plan options, Bryce Haney was reminded that I no longer take patients for medication management only. I asked him to let me know if he had no intention of taking advantage of the interventional therapies, so that we could make arrangements to provide this space to someone interested. I also made it clear that undergoing interventional therapies for the purpose of getting pain medications is very inappropriate on the part of a patient, and it will not be tolerated in this practice. This type of behavior would suggest true addiction and therefore it requires referral to an addiction specialist.   Further details on both, my assessment(s), as well as the proposed treatment plan, please see below.  Controlled Substance Pharmacotherapy Assessment REMS (Risk Evaluation and Mitigation Strategy)  Pill Count: None expected due to no prior prescriptions written by our practice. Rise Patience,  RN  10/27/2017 12:40 PM  Signed Safety precautions to be maintained throughout the outpatient stay will include: orient to surroundings, keep bed in low position, maintain call bell within reach at all times, provide assistance with transfer out of bed and ambulation.     Monitoring: [place] PMP: Online review of the past 89-monthperiod previously conducted. Not applicable at this point since we have not taken over the patient's medication management yet. List of other Serum/Urine Drug Screening Test(s):  No results found for: AMPHSCRSER, BARBSCRSER, BENZOSCRSER, COCAINSCRSER, COCAINSCRNUR, PCPSCRSER, THCSCRSER, THCU, CANNABQUANT, [place] of all UDS test(s) done:  Lab Results  Component Value Date   SUMMARY FINAL 10/12/2017   Last UDS on record: Summary  Date Value Ref Range Status  10/12/2017 FINAL  Final    Comment:    ==================================================================== TOXASSURE COMP DRUG ANALYSIS,UR ==================================================================== Test                             Result       Flag       Units Drug Present and Declared for Prescription Verification   Tramadol                       136          EXPECTED   ng/mg creat   O-Desmethyltramadol            68           EXPECTED   ng/mg creat   N-Desmethyltramadol            112          EXPECTED   ng/mg creat    Source of tramadol is a prescription medication.    O-desmethyltramadol and N-desmethyltramadol are expected    metabolites of tramadol.   Gabapentin                     PRESENT      EXPECTED   Mirtazapine                    PRESENT      EXPECTED   Acetaminophen                  PRESENT      EXPECTED   Metoprolol                     PRESENT      EXPECTED Drug Present not Declared for Prescription Verification   Carboxy-THC                    140          UNEXPECTED ng/mg creat    Carboxy-THC is a metabolite of tetrahydrocannabinol  (THC).     Source of [facility] LPis most commonly illicit, but THC is also present    in a scheduled prescription medication. Drug Absent but Declared for Prescription Verification   Oxycodone                      Not Detected UNEXPECTED ng/mg creat   Tizanidine                     Not Detected UNEXPECTED    Tizanidine, as indicated  in the declared medication list, is not    always detected even when used as directed.   Methocarbamol                  Not Detected UNEXPECTED ==================================================================== Test                      Result    Flag   Units      Ref Range   Creatinine              168              mg/dL      >=20 ==================================================================== Declared Medications:  The flagging and interpretation on this report are based on the  following declared medications.  Unexpected results may arise from  inaccuracies in the declared medications.  **Note: The testing scope of this panel includes these medications:  Gabapentin (Neurontin)  Methocarbamol (Robaxin)  Metoprolol (Lopressor)  Mirtazapine (Remeron)  Oxycodone (Percocet)  Tramadol (Ultram)  **Note: The testing scope of this panel does not include small to  moderate amounts of these reported medications:  Acetaminophen (Percocet)  Acetaminophen (Tylenol)  Tizanidine (Zanaflex)  **Note: The testing scope of this panel does not include following  reported medications:  Atorvastatin (Lipitor)  Ciprofloxacin (Cipro)  Furosemide (Lasix)  Levothyroxine  Methylprednisolone (Medrol)  Metronidazole (Flagyl)  Nicotine (Nicoderm)  Omeprazole (Prilosec)  Ranitidine (Zantac) ==================================================================== For clinical consultation, please call [phone]. ====================================================================    UDS interpretation: No unexpected findings.          Medication Assessment Form: Patient introduced  to form today Treatment compliance: Treatment may start today if patient agrees with proposed plan. Evaluation of compliance is not applicable at this point Risk Assessment Profile: Aberrant behavior: See initial evaluations. None observed or detected today Comorbid factors increasing risk of overdose: See initial evaluation. No additional risks detected today Medical Psychology Evaluation: Please see scanned results in medical record. Opioid risk tool (ORT) (Total Score): 1 Personal History of Substance Abuse (SUD-Substance use disorder):  Alcohol: Negative  Illegal Drugs: Negative  Rx Drugs: Negative  ORT Risk Level calculation: Low Risk Risk of substance use disorder (SUD): Low Opioid Risk Tool - 10/27/17 1238      Family History of Substance Abuse   Alcohol  Negative    Illegal Drugs  Negative    Rx Drugs  Negative      Personal History of Substance Abuse   Alcohol  Negative    Illegal Drugs  Negative    Rx Drugs  Negative      Age   Age between 50-45 years   No      History of Preadolescent Sexual Abuse   History of Preadolescent Sexual Abuse  Negative or Male      Psychological Disease   Psychological Disease  Negative    Depression  Positive      Total Score   Opioid Risk Tool Scoring  1    Opioid Risk Interpretation  Low Risk      ORT Scoring interpretation table:  Score <3 = Low Risk for SUD  Score between 4-7 = Moderate Risk for SUD  Score >8 = High Risk for Opioid Abuse   Risk Mitigation Strategies:  Patient opioid safety counseling: Completed today. Counseling provided to patient as per "Patient Counseling Document". Document signed by patient, attesting to counseling and understanding Patient-Prescriber Agreement (PPA): Obtained today.  Controlled substance notification to other providers: Written and  sent today.  Pharmacologic Plan: Today we may be taking over the patient's pharmacological regimen. See below.             Laboratory Chemistry    Inflammation Markers (CRP: Acute Phase) (ESR: Chronic Phase) Lab Results  Component Value Date   ESRSEDRATE 27 (H) 09/07/2012   LATICACIDVEN 0.9 06/19/2015                         Rheumatology Markers No results found for: RF, ANA, LABURIC, URICUR, LYMEIGGIGMAB, LYMEABIGMQN, HLAB27                      Renal Function Markers Lab Results  Component Value Date   BUN 19 10/20/2016   CREATININE 1.03 10/20/2016   GFRAA >60 10/20/2016   GFRNONAA >60 10/20/2016                             Hepatic Function Markers Lab Results  Component Value Date   AST 17 10/19/2016   ALT 20 10/19/2016   ALBUMIN 4.3 10/19/2016   ALKPHOS 75 10/19/2016                        Electrolytes Lab Results  Component Value Date   NA 134 (L) 10/20/2016   K 4.2 10/20/2016   CL 108 10/20/2016   CALCIUM 8.5 (L) 10/20/2016                        Neuropathy Markers No results found for: VITAMINB12, FOLATE, HGBA1C, HIV                      Bone Pathology Markers No results found for: VD25OH, VD125OH2TOT, [id number], OL0786LJ4, 25OHVITD1, 25OHVITD2, 25OHVITD3, TESTOFREE, TESTOSTERONE                       Coagulation Parameters Lab Results  Component Value Date   INR 0.91 10/19/2016   LABPROT 12.2 10/19/2016   APTT 33 05/22/2015   PLT 225 10/21/2016                        Cardiovascular Markers Lab Results  Component Value Date   BNP 240 (H) 11/26/2012   CKTOTAL 107 11/27/2012   CKMB 11.6 (H) 11/27/2012   TROPONINI <0.03 10/20/2016   HGB 14.0 10/21/2016   HCT 41.2 10/21/2016                         CA Markers No results found for: CEA, CA125, LABCA2                      Note: Lab results reviewed.  Recent Diagnostic Imaging Review  Cervical Imaging:  Results for orders placed during the hospital encounter of 10/18/17  DG Cervical Spine With Flex & Extend   Narrative CLINICAL DATA:  Chronic neck, upper and low back pain since 1978. Arthritis. No acute injury or prior relevant  surgery.  EXAM: CERVICAL SPINE COMPLETE WITH FLEXION AND EXTENSION VIEWS  COMPARISON:  Cervical MRI 09/16/2011  FINDINGS: The prevertebral soft tissues are normal. The alignment is anatomic through T1. There is no evidence of acute fracture or traumatic subluxation. The C1-2 articulation appears normal in the AP projection. There is moderate disc  space loss with uncinate spurring and osteophyte formation at C4-5, C5-6 and C6-7. These degenerative changes appear mildly progressive compared with the prior study pre-. Flexion and extension views demonstrate limited range of motion, but no dynamic instability. Oblique views demonstrate right-greater-than-left foraminal narrowing, worst on the right at C5-6 and C6-7.  IMPRESSION: Mildly progressive multilevel cervical spondylosis from 09/16/2011 MRI. Limited range of motion on flexion extension views without dynamic instability or acute osseous findings.   Electronically Signed   By: Richardean Sale M.D.   On: 10/18/2017 15:28      Results for orders placed during the hospital encounter of 10/18/17  DG Thoracic Spine 2 View   Narrative CLINICAL DATA:  Chronic neck, upper and low back pain since 1978. Arthritis. No acute injury or prior relevant surgery.  EXAM: THORACIC SPINE 2 VIEWS  COMPARISON:  Chest radiographs 06/19/2015.  Chest CT 10/20/2012.  FINDINGS: Twelve rib-bearing thoracic type vertebral bodies. The alignment appears near anatomic. There is a mild scoliosis. No evidence of acute fracture, paraspinal hematoma or widening of the interpedicular distance. There are mild thoracic spine degenerative changes. There is aortic atherosclerosis post median sternotomy and CABG.  IMPRESSION: Mild thoracic spine degenerative changes.  No acute findings.   Electronically Signed   By: Richardean Sale M.D.   On: 10/18/2017 15:25     Lumbar DG Bending views:  Results for orders placed during the hospital encounter  of 10/18/17  DG Lumbar Spine Complete W/Bend   Narrative CLINICAL DATA:  Chronic neck, upper and low back pain since 1978. Arthritis. No acute injury or prior relevant surgery.  EXAM: LUMBAR SPINE - COMPLETE WITH BENDING VIEWS  COMPARISON:  Abdominal CT 10/19/2016.  Lumbar MRI 11/30/2011.  FINDINGS: Five lumbar type vertebral bodies. There is advanced disc space narrowing with osteophyte formation at L5-S1, similar to previous CT. The additional disc spaces are preserved. The alignment is normal. Flexion and extension views demonstrate limited range of motion, but no dynamic instability. There is mild facet hypertrophy inferiorly. No evidence of acute fracture or pars defect. Mild aortic atherosclerosis again noted.  IMPRESSION: Stable chronic degenerative disc disease at L5-S1. Limited range of motion on flexion and extension views. No dynamic instability or acute osseous findings demonstrated.   Electronically Signed   By: Richardean Sale M.D.   On: 10/18/2017 15:30      Complexity Note: Imaging results reviewed. Results shared with Mr. Foots, using Layman's terms. Today I personally and independently reviewed the study images pertinent to Mr. Amodei's problem.            I have personally examined the images and I agree with the reported  findings. I find no additional pain-related pathology to add to the report.  Meds   Current Outpatient Medications:  .  acetaminophen (TYLENOL) 325 MG tablet, Take 1 tablet (325 mg total) by mouth every 6 (six) hours as needed for mild pain (or Fever >/= 101)., Disp: , Rfl:  .  aspirin 81 MG tablet, Take 81 mg by mouth daily., Disp: , Rfl:  .  atorvastatin (LIPITOR) 40 MG tablet, Take 40 mg by mouth daily., Disp: , Rfl:  .  gabapentin (NEURONTIN) 300 MG capsule, Take 300 mg by mouth at bedtime. , Disp: , Rfl:  .  levothyroxine (SYNTHROID, LEVOTHROID) 150 MCG tablet, Take 150 mcg by mouth daily before breakfast., Disp: , Rfl:  .   methocarbamol (ROBAXIN) 500 MG tablet, Take 500 mg by mouth every 8 (eight) hours as needed  for muscle spasms., Disp: , Rfl:  .  metoprolol tartrate (LOPRESSOR) 25 MG tablet, Take 12.5 mg by mouth 2 (two) times daily., Disp: , Rfl:  .  mirtazapine (REMERON) 30 MG tablet, Take 30 mg by mouth at bedtime., Disp: , Rfl:  .  omeprazole (PRILOSEC) 20 MG capsule, Take 20 mg by mouth daily., Disp: , Rfl:  .  ranitidine (ZANTAC) 150 MG tablet, Take by mouth., Disp: , Rfl:  .  furosemide (LASIX) 20 MG tablet, Take 20 mg by mouth 2 (two) times daily., Disp: , Rfl:  .  [START ON 11/02/2017] HYDROcodone-acetaminophen (NORCO/VICODIN) 5-325 MG tablet, Take 1-2 tablets by mouth daily as needed for moderate pain or severe pain., Disp: 60 tablet, Rfl: 0  ROS  Constitutional: Denies any fever or chills Gastrointestinal: No reported hemesis, hematochezia, vomiting, or acute GI distress Musculoskeletal: Denies any acute onset joint swelling, redness, loss of ROM, or weakness Neurological: No reported episodes of acute onset apraxia, aphasia, dysarthria, agnosia, amnesia, paralysis, loss of coordination, or loss of consciousness  Allergies  Mr. Lenoir is allergic to morphine and related.  [place]  Drug: Mr. Monforte  reports that he does not use drugs. Alcohol:  reports that he does not drink alcohol. Tobacco:  reports that he has been smoking. He has a 35.00 pack-year smoking history. He has never used smokeless tobacco. Medical:  has a past medical history of Atrial fibrillation ([place]), Back pain, CHF (congestive heart failure) ([place]), COPD (chronic obstructive pulmonary disease) ([place]), Hyperlipidemia, Myocardial infarction ([place]), S/P CABG (coronary artery bypass graft), and Thyroid disease. Surgical: Mr. Barella  has a past surgical history that includes Coronary angioplasty; Cardiac catheterization (N/A, 07/24/2014); Coronary angioplasty with stent; Coronary artery bypass graft; Joint replacement (2010); Joint  replacement (2016); Colonoscopy with propofol (N/A, 12/30/2016); and Shoulder surgery (Right). Family: family history includes Heart disease in his father and mother.  Constitutional Exam  General appearance: Well nourished, well developed, and well hydrated. In no apparent acute distress Vitals:   10/27/17 1227  BP: 116/85  Pulse: 71  Resp: 16  Temp: 98 F (36.7 C)  TempSrc: Oral  SpO2: 96%  Weight: 210 lb (95.3 kg)  Height: 6' (1.829 m)   BMI Assessment: Estimated body mass index is 28.48 kg/m as calculated from the following:   Height as of this encounter: 6' (1.829 m).   Weight as of this encounter: 210 lb (95.3 kg).  BMI interpretation table: BMI level Category Range association with higher incidence of chronic pain  <18 kg/m2 Underweight   18.5-24.9 kg/m2 Ideal body weight   25-29.9 kg/m2 Overweight Increased incidence by 20%  30-34.9 kg/m2 Obese (Class I) Increased incidence by 68%  35-39.9 kg/m2 Severe obesity (Class II) Increased incidence by 136%  >40 kg/m2 Extreme obesity (Class III) Increased incidence by 254%   Patient's current BMI Ideal Body weight  Body mass index is 28.48 kg/m. Ideal body weight: 77.6 kg (171 lb 1.2 oz) Adjusted ideal body weight: 84.7 kg (186 lb 10.3 oz)   BMI Readings from Last 4 Encounters:  10/27/17 28.48 kg/m  10/12/17 31.19 kg/m  10/08/17 29.84 kg/m  12/30/16 29.84 kg/m   Wt Readings from Last 4 Encounters:  10/27/17 210 lb (95.3 kg)  10/12/17 230 lb (104.3 kg)  10/08/17 220 lb (99.8 kg)  12/30/16 220 lb (99.8 kg)  Psych/Mental status: Alert, oriented x 3 (person, place, & time)       Eyes: PERLA Respiratory: No evidence of acute respiratory distress  Cervical Spine  Area Exam  Skin & Axial Inspection: No masses, redness, edema, swelling, or associated skin lesions Alignment: Symmetrical Functional ROM: Decreased ROM      Stability: No instability detected Muscle Tone/Strength: Functionally intact. No obvious  neuro-muscular anomalies detected. Sensory (Neurological): Musculoskeletal pain pattern Palpation: Complains of area being tender to palpation Positive provocative maneuver for for cervical facet disease  Upper Extremity (UE) Exam    Side: Right upper extremity  Side: Left upper extremity  Skin & Extremity Inspection: Skin color, temperature, and hair growth are WNL. No peripheral edema or cyanosis. No masses, redness, swelling, asymmetry, or associated skin lesions. No contractures.  Skin & Extremity Inspection: Skin color, temperature, and hair growth are WNL. No peripheral edema or cyanosis. No masses, redness, swelling, asymmetry, or associated skin lesions. No contractures.  Functional ROM: Unrestricted ROM          Functional ROM: Unrestricted ROM          Muscle Tone/Strength: Functionally intact. No obvious neuro-muscular anomalies detected.  Muscle Tone/Strength: Functionally intact. No obvious neuro-muscular anomalies detected.  Sensory (Neurological): Unimpaired          Sensory (Neurological): Unimpaired          Palpation: No palpable anomalies              Palpation: No palpable anomalies              Provocative Test(s):  Phalen's test: deferred Tinel's test: deferred Apley's scratch test (touch opposite shoulder):  Action 1 (Across chest): Decreased ROM Action 2 (Overhead): Decreased ROM Action 3 (LB reach): Decreased ROM   Provocative Test(s):  Phalen's test: deferred Tinel's test: deferred Apley's scratch test (touch opposite shoulder):  Action 1 (Across chest): Decreased ROM Action 2 (Overhead): Decreased ROM Action 3 (LB reach): Decreased ROM    Thoracic Spine Area Exam  Skin & Axial Inspection: No masses, redness, or swelling Alignment: Symmetrical Functional ROM: Unrestricted ROM Stability: No instability detected Muscle Tone/Strength: Functionally intact. No obvious neuro-muscular anomalies detected. Sensory (Neurological): Musculoskeletal pain pattern Muscle  strength & Tone: No palpable anomalies  Lumbar Spine Area Exam  Skin & Axial Inspection: No masses, redness, or swelling Alignment: Symmetrical Functional ROM: Unrestricted ROM       Stability: No instability detected Muscle Tone/Strength: Functionally intact. No obvious neuro-muscular anomalies detected. Sensory (Neurological): Musculoskeletal pain pattern Palpation: No palpable anomalies       Provocative Tests: Hyperextension/rotation test: (+) bilaterally for facet joint pain. Lumbar quadrant test (Kemp's test): deferred today       Lateral bending test: deferred today       Patrick's Maneuver: deferred today                   FABER test: deferred today                   S-I anterior distraction/compression test: deferred today         S-I lateral compression test: deferred today         S-I Thigh-thrust test: deferred today         S-I Gaenslen's test: deferred today          Gait & Posture Assessment  Ambulation: Unassisted Gait: Relatively normal for age and body habitus Posture: WNL   Lower Extremity Exam    Side: Right lower extremity  Side: Left lower extremity  Stability: No instability observed          Stability: No instability observed  Skin & Extremity Inspection: Skin color, temperature, and hair growth are WNL. No peripheral edema or cyanosis. No masses, redness, swelling, asymmetry, or associated skin lesions. No contractures.  Skin & Extremity Inspection: Skin color, temperature, and hair growth are WNL. No peripheral edema or cyanosis. No masses, redness, swelling, asymmetry, or associated skin lesions. No contractures.  Functional ROM: Unrestricted ROM                  Functional ROM: Unrestricted ROM                  Muscle Tone/Strength: Functionally intact. No obvious neuro-muscular anomalies detected.  Muscle Tone/Strength: Functionally intact. No obvious neuro-muscular anomalies detected.  Sensory (Neurological): Unimpaired  Sensory (Neurological):  Unimpaired  Palpation: No palpable anomalies  Palpation: No palpable anomalies   Assessment & Plan  Primary Diagnosis & Pertinent Problem List: The primary encounter diagnosis was Spondylosis of cervical region without myelopathy or radiculopathy. Diagnoses of DDD (degenerative disc disease), cervical, Cervical facet joint syndrome, Lumbar facet arthropathy, Lumbar spondylosis, S/P CABG x 2, and Chronic pain syndrome were also pertinent to this visit.  Visit Diagnosis: 1. Spondylosis of cervical region without myelopathy or radiculopathy   2. DDD (degenerative disc disease), cervical   3. Cervical facet joint syndrome   4. Lumbar facet arthropathy   5. Lumbar spondylosis   6. S/P CABG x 2   7. Chronic pain syndrome   General Recommendations: The pain condition that the patient suffers from is best treated with a multidisciplinary approach that involves an increase in physical activity to prevent de-conditioning and worsening of the pain cycle, as well as psychological counseling (formal and/or informal) to address the co-morbid psychological affects of pain. Treatment will often involve judicious use of pain medications and interventional procedures to decrease the pain, allowing the patient to participate in the physical activity that will ultimately produce long-lasting pain reductions. The goal of the multidisciplinary approach is to return the patient to a higher level of overall function and to restore their ability to perform activities of daily living.  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  His most painful area is between his periscapular region along his thoracic spine.  It is  painful to palpation.  Patient does have difficulty with movement.  Patient is currently on gabapentin 300 mg nightly.  Higher doses resulted in restless leg syndrome.  Patient did not find any significant benefit with Robaxin or tizanidine as well.  Today his cervical spine, thoracic, lumbar x-rays were reviewed.  Patient describes severe pain in his cervical spine that radiates bilaterally to his shoulders.  Patient's cervical spine x-ray shows mildly progressive multilevel cervical spondylosis and facet arthropathy.   Augusten Lipkin has a history of greater than 3 months of moderate to severe pain which is resulted in functional impairment.  The patient has tried various conservative therapeutic options such as NSAIDs, Tylenol, muscle relaxants, physical therapy which was inadequately effective.  Patient's pain is predominantly axial with physical exam findings suggestive of facet arthropathy.  Cervical facet medial branch nerve blocks were discussed with the patient.  Risks and benefits were reviewed.  Patient would like to proceed with bilateral C4, C5, C6, C7 medial branch nerve  block.  Regards to medication management, given that the patient is unable to tolerate NSAIDs and has failed muscle relaxers and is at highest dose of gabapentin, patient can try hydrocodone 5 mg daily to twice daily as needed for moderate to severe pain.  Prescription was incorrectly written for 120 tablets and has been modified and corrected to 60 tablets and pharmacy has been notified.  Plan: -Sign opioid contract -Continue gabapentin as prescribed -Discontinue tizanidine -Hydrocodone prescription as below -Return for bilateral cervical facet medial branch nerve blocks at C4, 5, 6, 7  Problems updated and reviewed during this visit: Problem  Spondylosis of Cervical Region Without Myelopathy Or Radiculopathy  Ddd (Degenerative Disc Disease), Cervical  Cervical Facet Joint Syndrome  Chronic Pain Syndrome     Plan of Care  Pharmacotherapy (Medications Ordered): Meds ordered this encounter  Medications  . DISCONTD: HYDROcodone-acetaminophen (NORCO/VICODIN) 5-325 MG tablet    Sig: Take 1-2 tablets by mouth daily as needed for moderate pain or severe pain.    Dispense:  120 tablet    Refill:  0    Do not place this medication, or any other prescription from our practice, on "Automatic Refill". Patient may have prescription filled one day early if pharmacy is closed on scheduled refill date.  Marland Kitchen HYDROcodone-acetaminophen (NORCO/VICODIN) 5-325 MG tablet    Sig: Take 1-2 tablets by mouth daily as needed for moderate pain or severe pain.    Dispense:  60 tablet    Refill:  0    Do not place this medication, or any other prescription from our practice, on "Automatic Refill". Patient may have prescription filled one day early if pharmacy is closed on scheduled refill date.   Lab-work, procedure(s), and/or referral(s): Orders Placed This Encounter  Procedures  . CERVICAL FACET (MEDIAL BRANCH NERVE BLOCK)    Considering:   Cervical facet medial branch nerve blocks Thoracic facet medial branch nerve blocks Lumbar facet medial branch nerve blocks   Provider-requested follow-up: Return in about 3 weeks (around 11/17/2017) for Procedure.  Time Note: Greater than 50% of the 25 minute(s) of face-to-face time spent with Mr. Isabell, was spent in counseling/coordination of care regarding: Mr. Noone primary cause of pain, the results of his recent test(s), the significance of each one oth the test(s) anomalies and it's corresponding characteristic pain pattern(s), the treatment plan, treatment alternatives, the risks and possible complications of proposed treatment, medication side effects, going over the informed consent, the opioid analgesic risks and possible complications, the appropriate use of his medications, realistic expectations, the goals of pain management (increased in functionality), the  need to bring and keep the BMI below 30, the medication agreement and the patient's responsibilities when it comes to controlled substances.  Future Appointments  Date Time Provider [place]  11/21/2017 10:30 AM Gillis Santa, MD [facility] None    Primary Care Physician: Letta Median, MD Location: [facility] Note by: Gillis Santa, M.D Date: 10/27/2017; Time: 2:29 PM  Patient Instructions   Sign opioid contract, prescription sent to pharmacy.  Your escribed hydrocodone with acetaminophen will last until 12/26/2017.  Facet Blocks Patient Information  Description: The facets are joints in the spine between the vertebrae.  Like any joints in the body, facets can become irritated and painful.  Arthritis can also effect the facets.  By injecting steroids and local anesthetic in and around these joints, we can temporarily block the nerve supply to them.  Steroids act directly on irritated nerves and tissues to  reduce selling and inflammation which often leads to decreased pain.  Facet blocks may be done anywhere along the spine from the neck to the low back depending upon the location of your pain.   After numbing the skin with local anesthetic (like Novocaine), a small needle is passed onto the facet joints under x-ray guidance.  You may experience a sensation of pressure while this is being done.  The entire block usually lasts about 15-25 minutes.   Conditions which may be treated by facet blocks:   Low back/buttock pain  Neck/shoulder pain  Certain types of headaches  Preparation for the injection:  1. Do not eat any solid food or dairy products within 8 hours of your appointment. 2. You may drink clear liquid up to 3 hours before appointment.  Clear liquids include water, black coffee, juice or soda.  No milk or cream please. 3. You may take your regular medication, including pain medications, with a sip of water before your appointment.   Diabetics should hold regular insulin (if taken separately) and take 1/2 normal NPH dose the morning of the procedure.  Carry some sugar containing items with you to your appointment. 4. A driver must accompany you and be prepared to drive you home after your procedure. 5. Bring all your current medications with you. 6. An IV may be inserted and sedation may be given at the discretion of the physician. 7. A blood pressure cuff, EKG and other monitors will often be applied during the procedure.  Some patients may need to have extra oxygen administered for a short period. 8. You will be asked to provide medical information, including your allergies and medications, prior to the procedure.  We must know immediately if you are taking blood thinners (like Coumadin/Warfarin) or if you are allergic to IV iodine contrast (dye).  We must know if you could possible be pregnant.  Possible side-effects:   Bleeding from needle site  Infection (rare, may require surgery)  Nerve injury (rare)  Numbness & tingling (temporary)  Difficulty urinating (rare, temporary)  Spinal headache (a headache worse with upright posture)  Light-headedness (temporary)  Pain at injection site (serveral days)  Decreased blood pressure (rare, temporary)  Weakness in arm/leg (temporary)  Pressure sensation in back/neck (temporary)   Call if you experience:   Fever/chills associated with headache or increased back/neck pain  Headache worsened by an upright position  New onset, weakness or numbness of an extremity below the injection site  Hives or difficulty breathing (go to the emergency room)  Inflammation or drainage at the injection site(s)  Severe back/neck pain greater than usual  New symptoms which are concerning to you  Please note:  Although the local anesthetic injected can often make your back or neck feel good for several hours after the injection, the pain will likely return. It takes 3-7  days for steroids to work.  You may not notice any pain relief for at least one week.  If effective, we will often do a series of 2-3 injections spaced 3-6 weeks apart to maximally decrease your pain.  After the initial series, you may be a candidate for a more permanent nerve block of the facets.  If you have any questions, please call #[phone]) [facility] Moderate Conscious Sedation, Adult Sedation is the use of medicines to promote relaxation and relieve discomfort and anxiety. Moderate conscious sedation is a type of sedation. Under moderate conscious sedation, you are less alert than  normal, but you are still able to respond to instructions, touch, or both. Moderate conscious sedation is used during short medical and dental procedures. It is milder than deep sedation, which is a type of sedation under which you cannot be easily woken up. It is also milder than general anesthesia, which is the use of medicines to make you unconscious. Moderate conscious sedation allows you to return to your regular activities sooner. Tell a health care provider about:  Any allergies you have.  All medicines you are taking, including vitamins, herbs, eye drops, creams, and over-the-counter medicines.  Use of steroids (by mouth or creams).  Any problems you or family members have had with sedatives and anesthetic medicines.  Any blood disorders you have.  Any surgeries you have had.  Any medical conditions you have, such as sleep apnea.  Whether you are pregnant or may be pregnant.  Any use of cigarettes, alcohol, marijuana, or street drugs. What are the risks? Generally, this is a safe procedure. However, problems may occur, including:  Getting too much medicine (oversedation).  Nausea.  Allergic reaction to medicines.  Trouble breathing. If this happens, a breathing tube may be used to help with breathing. It will be removed when you are awake and  breathing on your own.  Heart trouble.  Lung trouble.  What happens before the procedure? Staying hydrated Follow instructions from your health care provider about hydration, which may include:  Up to 2 hours before the procedure - you may continue to drink clear liquids, such as water, clear fruit juice, black coffee, and plain tea.  Eating and drinking restrictions Follow instructions from your health care provider about eating and drinking, which may include:  8 hours before the procedure - stop eating heavy meals or foods such as meat, fried foods, or fatty foods.  6 hours before the procedure - stop eating light meals or foods, such as toast or cereal.  6 hours before the procedure - stop drinking milk or drinks that contain milk.  2 hours before the procedure - stop drinking clear liquids.  Medicine  Ask your health care provider about:  Changing or stopping your regular medicines. This is especially important if you are taking diabetes medicines or blood thinners.  Taking medicines such as aspirin and ibuprofen. These medicines can thin your blood. Do not take these medicines before your procedure if your health care provider instructs you not to.  Tests and exams  You will have a physical exam.  You may have blood tests done to show: ? How well your kidneys and liver are working. ? How well your blood can clot. General instructions  Plan to have someone take you home from the hospital or clinic.  If you will be going home right after the procedure, plan to have someone with you for 24 hours. What happens during the procedure?  An IV tube will be inserted into one of your veins.  Medicine to help you relax (sedative) will be given through the IV tube.  The medical or dental procedure will be performed. What happens after the procedure?  Your blood pressure, heart rate, breathing rate, and blood oxygen level will be monitored often until the medicines you  were given have worn off.  Do not drive for 24 hours. This information is not intended to replace advice given to you by your health care provider. Make sure you discuss any questions you have with your health care provider. Document Released: 11/17/2000 Document Revised:  07/29/2015 Document Reviewed: 06/14/2015 Elsevier Interactive Patient Education  2018 [place] AND COMPLICATIONS  What are the risk, side effects and possible complications? Generally speaking, most procedures are safe.  However, with any procedure there are risks, side effects, and the possibility of complications.  The risks and complications are dependent upon the sites that are lesioned, or the type of nerve block to be performed.  The closer the procedure is to the spine, the more serious the risks are.  Great care is taken when placing the radio frequency needles, block needles or lesioning probes, but sometimes complications can occur. 1. Infection: Any time there is an injection through the skin, there is a risk of infection.  This is why sterile conditions are used for these blocks.  There are four possible types of infection. 1. Localized skin infection. 2. Central Nervous System Infection-This can be in the form of Meningitis, which can be deadly. 3. Epidural Infections-This can be in the form of an epidural abscess, which can cause pressure inside of the spine, causing compression of the spinal cord with subsequent paralysis. This would require an emergency surgery to decompress, and there are no guarantees that the patient would recover from the paralysis. 4. Discitis-This is an infection of the intervertebral discs.  It occurs in about 1% of discography procedures.  It is difficult to treat and it may lead to surgery.        2. Pain: the needles have to go through skin and soft tissues, will cause soreness.       3. Damage to internal structures:  The nerves to be lesioned may be near blood vessels  or    other nerves which can be potentially damaged.       4. Bleeding: Bleeding is more common if the patient is taking blood thinners such as  aspirin, Coumadin, Ticiid, Plavix, etc., or if he/she have some genetic predisposition  such as hemophilia. Bleeding into the spinal canal can cause compression of the spinal  cord with subsequent paralysis.  This would require an emergency surgery to  decompress and there are no guarantees that the patient would recover from the  paralysis.       5. Pneumothorax:  Puncturing of a lung is a possibility, every time a needle is introduced in  the area of the chest or upper back.  Pneumothorax refers to free air around the  collapsed lung(s), inside of the thoracic cavity (chest cavity).  Another two possible  complications related to a similar event would include: Hemothorax and Chylothorax.   These are variations of the Pneumothorax, where instead of air around the collapsed  lung(s), you may have blood or chyle, respectively.       6. Spinal headaches: They may occur with any procedures in the area of the spine.       7. Persistent CSF (Cerebro-Spinal Fluid) leakage: This is a rare problem, but may occur  with prolonged intrathecal or epidural catheters either due to the formation of a fistulous  track or a dural tear.       8. Nerve damage: By working so close to the spinal cord, there is always a possibility of  nerve damage, which could be as serious as a permanent spinal cord injury with  paralysis.       9. Death:  Although rare, severe deadly allergic reactions known as "Anaphylactic  reaction" can occur to any of the medications used.  10. Worsening of the symptoms:  We can always make thing worse.  What are the chances of something like this happening? Chances of any of this occuring are extremely low.  By statistics, you have more of a chance of getting killed in a motor vehicle accident: while driving to the hospital than any of the above occurring  .  Nevertheless, you should be aware that they are possibilities.  In general, it is similar to taking a shower.  Everybody knows that you can slip, hit your head and get killed.  Does that mean that you should not shower again?  Nevertheless always keep in mind that statistics do not mean anything if you happen to be on the wrong side of them.  Even if a procedure has a 1 (one) in a 1,000,000 (million) chance of going wrong, it you happen to be that one..Also, keep in mind that by statistics, you have more of a chance of having something go wrong when taking medications.  Who should not have this procedure? If you are on a blood thinning medication (e.g. Coumadin, Plavix, see list of "Blood Thinners"), or if you have an active infection going on, you should not have the procedure.  If you are taking any blood thinners, please inform your physician.  How should I prepare for this procedure?  Do not eat or drink anything at least six hours prior to the procedure.  Bring a driver with you .  It cannot be a taxi.  Come accompanied by an adult that can drive you back, and that is strong enough to help you if your legs get weak or numb from the local anesthetic.  Take all of your medicines the morning of the procedure with just enough water to swallow them.  If you have diabetes, make sure that you are scheduled to have your procedure done first thing in the morning, whenever possible.  If you have diabetes, take only half of your insulin dose and notify our nurse that you have done so as soon as you arrive at the clinic.  If you are diabetic, but only take blood sugar pills (oral hypoglycemic), then do not take them on the morning of your procedure.  You may take them after you have had the procedure.  Do not take aspirin or any aspirin-containing medications, at least eleven (11) days prior to the procedure.  They may prolong bleeding.  Wear loose fitting clothing that may be easy to take off and  that you would not mind if it got stained with Betadine or blood.  Do not wear any jewelry or perfume  Remove any nail coloring.  It will interfere with some of our monitoring equipment.  NOTE: Remember that this is not meant to be interpreted as a complete list of all possible complications.  Unforeseen problems may occur.  BLOOD THINNERS The following drugs contain aspirin or other products, which can cause increased bleeding during surgery and should not be taken for 2 weeks prior to and 1 week after surgery.  If you should need take something for relief of minor pain, you may take acetaminophen which is found in Tylenol,m Datril, Anacin-3 and Panadol. It is not blood thinner. The products listed below are.  Do not take any of the products listed below in addition to any listed on your instruction sheet.  A.P.C or A.P.C with Codeine Codeine Phosphate Capsules #3 Ibuprofen Ridaura  ABC compound Congesprin Imuran rimadil  Advil Cope Indocin Robaxisal  Alka-Seltzer Effervescent Pain Reliever and Antacid Coricidin or Coricidin-D  Indomethacin Rufen  Alka-Seltzer plus Cold Medicine Cosprin Ketoprofen S-A-C Tablets  Anacin Analgesic Tablets or Capsules Coumadin Korlgesic Salflex  Anacin Extra Strength Analgesic tablets or capsules CP-2 Tablets Lanoril Salicylate  Anaprox Cuprimine Capsules Levenox Salocol  Anexsia-D Dalteparin Magan Salsalate  Anodynos Darvon compound Magnesium Salicylate Sine-off  Ansaid Dasin Capsules Magsal Sodium Salicylate  Anturane Depen Capsules Marnal Soma  APF Arthritis pain formula Dewitt's Pills Measurin Stanback  Argesic Dia-Gesic Meclofenamic Sulfinpyrazone  Arthritis Bayer Timed Release Aspirin Diclofenac Meclomen Sulindac  Arthritis pain formula Anacin Dicumarol Medipren Supac  Analgesic (Safety coated) Arthralgen Diffunasal Mefanamic Suprofen  Arthritis Strength Bufferin Dihydrocodeine Mepro Compound Suprol  Arthropan liquid Dopirydamole Methcarbomol with  Aspirin Synalgos  ASA tablets/Enseals Disalcid Micrainin Tagament  Ascriptin Doan's Midol Talwin  Ascriptin A/D Dolene Mobidin Tanderil  Ascriptin Extra Strength Dolobid Moblgesic Ticlid  Ascriptin with Codeine Doloprin or Doloprin with Codeine Momentum Tolectin  Asperbuf Duoprin Mono-gesic Trendar  Aspergum Duradyne Motrin or Motrin IB Triminicin  Aspirin plain, buffered or enteric coated Durasal Myochrisine Trigesic  Aspirin Suppositories Easprin Nalfon Trillsate  Aspirin with Codeine Ecotrin Regular or Extra Strength Naprosyn Uracel  Atromid-S Efficin Naproxen Ursinus  Auranofin Capsules Elmiron Neocylate Vanquish  Axotal Emagrin Norgesic Verin  Azathioprine Empirin or Empirin with Codeine Normiflo Vitamin E  Azolid Emprazil Nuprin Voltaren  Bayer Aspirin plain, buffered or children's or timed BC Tablets or powders Encaprin Orgaran Warfarin Sodium  Buff-a-Comp Enoxaparin Orudis Zorpin  Buff-a-Comp with Codeine Equegesic Os-Cal-Gesic   Buffaprin Excedrin plain, buffered or Extra Strength Oxalid   Bufferin Arthritis Strength Feldene Oxphenbutazone   Bufferin plain or Extra Strength Feldene Capsules Oxycodone with Aspirin   Bufferin with Codeine Fenoprofen Fenoprofen Pabalate or Pabalate-SF   Buffets II Flogesic Panagesic   Buffinol plain or Extra Strength Florinal or Florinal with Codeine Panwarfarin   Buf-Tabs Flurbiprofen Penicillamine   Butalbital Compound Four-way cold tablets Penicillin   Butazolidin Fragmin Pepto-Bismol   Carbenicillin Geminisyn Percodan   Carna Arthritis Reliever Geopen Persantine   Carprofen Gold's salt Persistin   Chloramphenicol Goody's Phenylbutazone   Chloromycetin Haltrain Piroxlcam   Clmetidine heparin Plaquenil   Cllnoril Hyco-pap Ponstel   Clofibrate Hydroxy chloroquine Propoxyphen         Before stopping any of these medications, be sure to consult the physician who ordered them.  Some, such as Coumadin (Warfarin) are ordered to prevent or  treat serious conditions such as "deep thrombosis", "pumonary embolisms", and other heart problems.  The amount of time that you may need off of the medication may also vary with the medication and the reason for which you were taking it.  If you are taking any of these medications, please make sure you notify your pain physician before you undergo any procedures.

## 2017-11-01 ENCOUNTER — Telehealth: Payer: Self-pay | Admitting: Student in an Organized Health Care Education/Training Program

## 2017-11-01 NOTE — Telephone Encounter (Signed)
Patient picked up meds at [facility], [facility] [place]. Only received a 7 day supply and was told by pharmacy they had to have PA completed by our office. They told patient not to call until today as we should already know this. Patient is out today. Needs PA and New script Please let patient know when he can get his meds

## 2017-11-01 NOTE — Telephone Encounter (Signed)
Patient only received 14 of the 60 pills ordered due to PA being done. Needs new script for the remaining 46 pills. Please assist.

## 2017-11-01 NOTE — Telephone Encounter (Signed)
PA was sent on 10/27/2017. Patient was instructed to call pharmacy for FU.

## 2017-11-02 MED ORDER — HYDROCODONE-ACETAMINOPHEN 5-325 MG PO TABS: 1.0000 | ORAL_TABLET | Freq: Every day | ORAL | 0 refills | Status: DC | PRN

## 2017-11-03 ENCOUNTER — Telehealth: Payer: Self-pay

## 2017-11-03 ENCOUNTER — Other Ambulatory Visit: Payer: Self-pay | Admitting: Pain Medicine

## 2017-11-03 DIAGNOSIS — G894 Chronic pain syndrome: Secondary | ICD-10-CM

## 2017-11-03 MED ORDER — HYDROCODONE-ACETAMINOPHEN 5-325 MG PO TABS: 1.0000 | ORAL_TABLET | Freq: Every day | ORAL | 0 refills | Status: DC | PRN

## 2017-11-03 NOTE — Telephone Encounter (Signed)
Patient states he will pick up script by 10am tomorrow. The pharmacy that Dr. Holley Raring prescribed to does not accept E-scribed controlled medications. Dr. Dossie Arbour printed script for patient in Dr. Elwyn Lade absence.

## 2017-11-03 NOTE — Telephone Encounter (Signed)
Pharmacy called about Bryce Haney, He was only given 14 tab, he will need new Rx for the rest of his tablets the electronic Rx will be destroyed., Can call [phone] Floor ext [phone]

## 2017-11-04 NOTE — Telephone Encounter (Signed)
Patient came in today to pick up script, he did not bring empty med bottle or meds. Became very irrate because he could not get script without this. Said he was not told to bring anything, just to pick up script. Patient left.

## 2017-11-21 ENCOUNTER — Other Ambulatory Visit: Payer: Self-pay

## 2017-11-21 ENCOUNTER — Ambulatory Visit
Admission: RE | Admit: 2017-11-21 | Discharge: 2017-11-21 | Disposition: A | Discharge status: Home / Self Care | Payer: Medicaid Other | Source: Ambulatory Visit | Attending: Student in an Organized Health Care Education/Training Program | Admitting: Student in an Organized Health Care Education/Training Program

## 2017-11-21 ENCOUNTER — Encounter: Payer: Self-pay | Admitting: Student in an Organized Health Care Education/Training Program

## 2017-11-21 ENCOUNTER — Ambulatory Visit: Payer: Medicaid Other | Admitting: Student in an Organized Health Care Education/Training Program

## 2017-11-21 VITALS — BP 123/76 | HR 70 | Temp 97.8°F | Resp 18 | Ht 72.0 in | Wt 210.0 lb

## 2017-11-21 DIAGNOSIS — Z955 Presence of coronary angioplasty implant and graft: Secondary | ICD-10-CM | POA: Insufficient documentation

## 2017-11-21 DIAGNOSIS — Z79899 Other long term (current) drug therapy: Secondary | ICD-10-CM | POA: Insufficient documentation

## 2017-11-21 DIAGNOSIS — Z7982 Long term (current) use of aspirin: Secondary | ICD-10-CM | POA: Diagnosis not present

## 2017-11-21 DIAGNOSIS — M47812 Spondylosis without myelopathy or radiculopathy, cervical region: Secondary | ICD-10-CM | POA: Insufficient documentation

## 2017-11-21 DIAGNOSIS — Z885 Allergy status to narcotic agent status: Secondary | ICD-10-CM | POA: Diagnosis not present

## 2017-11-21 DIAGNOSIS — Z79890 Hormone replacement therapy (postmenopausal): Secondary | ICD-10-CM | POA: Diagnosis not present

## 2017-11-21 DIAGNOSIS — M25519 Pain in unspecified shoulder: Secondary | ICD-10-CM | POA: Insufficient documentation

## 2017-11-21 DIAGNOSIS — Z951 Presence of aortocoronary bypass graft: Secondary | ICD-10-CM | POA: Insufficient documentation

## 2017-11-21 DIAGNOSIS — M542 Cervicalgia: Secondary | ICD-10-CM | POA: Diagnosis not present

## 2017-11-21 MED ORDER — LACTATED RINGERS IV SOLN
1000.0000 mL | Freq: Once | INTRAVENOUS | Status: AC
  Administered 2017-11-21: 1000 mL via INTRAVENOUS

## 2017-11-21 MED ORDER — DEXAMETHASONE SODIUM PHOSPHATE 10 MG/ML IJ SOLN
INTRAMUSCULAR | Status: AC
  Filled 2017-11-21: qty 2

## 2017-11-21 MED ORDER — DEXAMETHASONE SODIUM PHOSPHATE 10 MG/ML IJ SOLN
10.0000 mg | Freq: Once | INTRAMUSCULAR | Status: AC
  Administered 2017-11-21: 10 mg

## 2017-11-21 MED ORDER — LIDOCAINE HCL 2 % IJ SOLN
INTRAMUSCULAR | Status: AC
  Filled 2017-11-21: qty 20

## 2017-11-21 MED ORDER — ROPIVACAINE HCL 2 MG/ML IJ SOLN
10.0000 mL | Freq: Once | INTRAMUSCULAR | Status: AC
  Administered 2017-11-21: 10 mL

## 2017-11-21 MED ORDER — FENTANYL CITRATE (PF) 100 MCG/2ML IJ SOLN
INTRAMUSCULAR | Status: AC
  Filled 2017-11-21: qty 2

## 2017-11-21 MED ORDER — ROPIVACAINE HCL 2 MG/ML IJ SOLN
INTRAMUSCULAR | Status: AC
  Filled 2017-11-21: qty 10

## 2017-11-21 MED ORDER — LIDOCAINE HCL 2 % IJ SOLN
20.0000 mL | Freq: Once | INTRAMUSCULAR | Status: AC
  Administered 2017-11-21: 400 mg

## 2017-11-21 MED ORDER — FENTANYL CITRATE (PF) 100 MCG/2ML IJ SOLN
25.0000 ug | INTRAMUSCULAR | Status: DC | PRN
  Administered 2017-11-21: 50 ug via INTRAVENOUS

## 2017-11-21 NOTE — Progress Notes (Signed)
Safety precautions to be maintained throughout the outpatient stay will include: orient to surroundings, keep bed in low position, maintain call bell within reach at all times, provide assistance with transfer out of bed and ambulation.  

## 2017-11-21 NOTE — Progress Notes (Signed)
Patient's Name: Bryce Haney  MRN: [id number]  Referring Provider: Letta Median, MD  DOB: [birth year]  PCP: Letta Median, MD  DOS: 11/21/2017  Note by: Gillis Santa, MD  Service setting: Ambulatory outpatient  Specialty: Interventional Pain Management  Patient type: Established  Location: [facility] (AMB) Pain Management Facility  Visit type: Interventional Procedure   Primary Reason for Visit: Interventional Pain Management Treatment. CC: Neck Pain and Shoulder Pain  Procedure:          Anesthesia, Analgesia, Anxiolysis:  Type: Cervical Facet Medial Branch Block(s)           Primary Purpose: Diagnostic Region: Posterolateral cervical spine Level: C4, C5, C6, & C7 Medial Branch Level(s). Injecting these levels blocks the C4-5, C5-6, and C6-7 cervical facet joints. Laterality: Bilateral  Type: Moderate (Conscious) Sedation combined with Local Anesthesia Indication(s): Analgesia and Anxiety Route: Intravenous (IV) IV Access: Secured Sedation: Meaningful verbal contact was maintained at all times during the procedure  Local Anesthetic: Lidocaine 1-2%   Indications: 1. Spondylosis of cervical region without myelopathy or radiculopathy    Pain Score: Pre-procedure: 3 /10 Post-procedure: 0-No pain/10  Pre-op Assessment:  Bryce Haney is a 67 y.o. (year old), male patient, seen today for interventional treatment. He  has a past surgical history that includes Coronary angioplasty; Cardiac catheterization (N/A, 07/24/2014); Coronary angioplasty with stent; Coronary artery bypass graft; Joint replacement (2010); Joint replacement (2016); Colonoscopy with propofol (N/A, 12/30/2016); and Shoulder surgery (Right). Bryce Haney has a current medication list which includes the following prescription(s): aspirin, atorvastatin, gabapentin, levothyroxine, methocarbamol, mirtazapine, omeprazole, ranitidine, acetaminophen, furosemide, hydrocodone-acetaminophen, and metoprolol tartrate, and the  following Facility-Administered Medications: fentanyl. His primarily concern today is the Neck Pain and Shoulder Pain  Initial Vital Signs:  Pulse/HCG Rate: 70ECG Heart Rate: 74 Temp: 97.8 F (36.6 C) Resp: 18 BP: 106/79 SpO2: 99 %  BMI: Estimated body mass index is 28.48 kg/m as calculated from the following:   Height as of this encounter: 6' (1.829 m).   Weight as of this encounter: 210 lb (95.3 kg).  Risk Assessment: Allergies: Reviewed. He is allergic to morphine and related.  Allergy Precautions: None required Coagulopathies: Reviewed. None identified.  Blood-thinner therapy: None at this time Active Infection(s): Reviewed. None identified. Bryce Haney is afebrile  Site Confirmation: Bryce Haney was asked to confirm the procedure and laterality before marking the site Procedure checklist: Completed Consent: Before the procedure and under the influence of no sedative(s), amnesic(s), or anxiolytics, the patient was informed of the treatment options, risks and possible complications. To fulfill our ethical and legal obligations, as recommended by the American Medical Association's Code of Ethics, I have informed the patient of my clinical impression; the nature and purpose of the treatment or procedure; the risks, benefits, and possible complications of the intervention; the alternatives, including doing nothing; the risk(s) and benefit(s) of the alternative treatment(s) or procedure(s); and the risk(s) and benefit(s) of doing nothing. The patient was provided information about the general risks and possible complications associated with the procedure. These may include, but are not limited to: failure to achieve desired goals, infection, bleeding, organ or nerve damage, allergic reactions, paralysis, and death. In addition, the patient was informed of those risks and complications associated to Spine-related procedures, such as failure to decrease pain; infection (i.e.: Meningitis,  epidural or intraspinal abscess); bleeding (i.e.: epidural hematoma, subarachnoid hemorrhage, or any other type of intraspinal or peri-dural bleeding); organ or nerve damage (i.e.: Any type of peripheral nerve, nerve root, or  spinal cord injury) with subsequent damage to sensory, motor, and/or autonomic systems, resulting in permanent pain, numbness, and/or weakness of one or several areas of the body; allergic reactions; (i.e.: anaphylactic reaction); and/or death. Furthermore, the patient was informed of those risks and complications associated with the medications. These include, but are not limited to: allergic reactions (i.e.: anaphylactic or anaphylactoid reaction(s)); adrenal axis suppression; blood sugar elevation that in diabetics may result in ketoacidosis or comma; water retention that in patients with history of congestive heart failure may result in shortness of breath, pulmonary edema, and decompensation with resultant heart failure; weight gain; swelling or edema; medication-induced neural toxicity; particulate matter embolism and blood vessel occlusion with resultant organ, and/or nervous system infarction; and/or aseptic necrosis of one or more joints. Finally, the patient was informed that Medicine is not an exact science; therefore, there is also the possibility of unforeseen or unpredictable risks and/or possible complications that may result in a catastrophic outcome. The patient indicated having understood very clearly. We have given the patient no guarantees and we have made no promises. Enough time was given to the patient to ask questions, all of which were answered to the patient's satisfaction. Bryce Haney has indicated that he wanted to continue with the procedure. Attestation: I, the ordering provider, attest that I have discussed with the patient the benefits, risks, side-effects, alternatives, likelihood of achieving goals, and potential problems during recovery for the procedure  that I have provided informed consent. Date  Time: 11/21/2017 10:21 AM  Pre-Procedure Preparation:  Monitoring: As per clinic protocol. Respiration, ETCO2, SpO2, BP, heart rate and rhythm monitor placed and checked for adequate function Safety Precautions: Patient was assessed for positional comfort and pressure points before starting the procedure. Time-out: I initiated and conducted the "Time-out" before starting the procedure, as per protocol. The patient was asked to participate by confirming the accuracy of the "Time Out" information. Verification of the correct person, site, and procedure were performed and confirmed by me, the nursing staff, and the patient. "Time-out" conducted as per Joint Commission's Universal Protocol (UP.01.01.01). Time: 1137  Description of Procedure:          Position: Prone with head of the table raised to facilitate breathing. Laterality: Bilateral. The procedure was performed in identical fashion on both sides. Level:  C4, C5, C6, & C7 Medial Branch Level(s). Area Prepped: Posterior Cervico-thoracic Region Prepping solution: ChloraPrep (2% chlorhexidine gluconate and 70% isopropyl alcohol) Safety Precautions: Aspiration looking for blood return was conducted prior to all injections. At no point did we inject any substances, as a needle was being advanced. Before injecting, the patient was told to immediately notify me if he was experiencing any new onset of "ringing in the ears, or metallic taste in the mouth". No attempts were made at seeking any paresthesias. Safe injection practices and needle disposal techniques used. Medications properly checked for expiration dates. SDV (single dose vial) medications used. After the completion of the procedure, all disposable equipment used was discarded in the proper designated medical waste containers. Local Anesthesia: Protocol guidelines were followed. The patient was positioned over the fluoroscopy table. The area was  prepped in the usual manner. The time-out was completed. The target area was identified using fluoroscopy. A 12-in long, straight, sterile hemostat was used with fluoroscopic guidance to locate the targets for each level blocked. Once located, the skin was marked with an approved surgical skin marker. Once all sites were marked, the skin (epidermis, dermis, and hypodermis), as well as  deeper tissues (fat, connective tissue and muscle) were infiltrated with a small amount of a short-acting local anesthetic, loaded on a 10cc syringe with a 25G, 1.5-in  Needle. An appropriate amount of time was allowed for local anesthetics to take effect before proceeding to the next step. Local Anesthetic: Lidocaine 2.0% The unused portion of the local anesthetic was discarded in the proper designated containers. Technical explanation of process:   C4 Medial Branch Nerve Block (MBB): The target area for the C4 dorsal medial articular branch is the lateral concave waist of the articular pillar of C4. Under fluoroscopic guidance, a Quincke needle was inserted until contact was made with os over the postero-lateral aspect of the articular pillar of C4 (target area). After negative aspiration for blood,5mL of the nerve block solution was injected without difficulty or complication. The needle was removed intact. C5 Medial Branch Nerve Block (MBB): The target area for the C5 dorsal medial articular branch is the lateral concave waist of the articular pillar of C5. Under fluoroscopic guidance, a Quincke needle was inserted until contact was made with os over the postero-lateral aspect of the articular pillar of C5 (target area). After negative aspiration for blood, 1 mL of the nerve block solution was injected without difficulty or complication. The needle was removed intact. C6 Medial Branch Nerve Block (MBB): The target area for the C6 dorsal medial articular branch is the lateral concave waist of the articular pillar of C6. Under  fluoroscopic guidance, a Quincke needle was inserted until contact was made with os over the postero-lateral aspect of the articular pillar of C6 (target area). After negative aspiration for blood, 1 mL of the nerve block solution was injected without difficulty or complication. The needle was removed intact. C7 Medial Branch Nerve Block (MBB): The target for the C7 dorsal medial articular branch lies on the superior-medial tip of the C7 transverse process. Under fluoroscopic guidance, a Quincke needle was inserted until contact was made with os over the postero-lateral aspect of the articular pillar of C7 (target area). After negative aspiration for blood, 76mL of the nerve block solution was injected without difficulty or complication. The needle was removed intact. Procedural Needles: 22-gauge, 3.5-inch, Quincke needles used for all levels. Nerve block solution: 10 cc solution made of 9 cc of 0.2% ropivacaine, 1 cc of Decadron 10 mg/cc.  1 cc injected at each level above bilaterally.  The unused portion of the solution was discarded in the proper designated containers.  Once the entire procedure was completed, the treated area was cleaned, making sure to leave some of the prepping solution back to take advantage of its long term bactericidal properties.  Vitals:   11/21/17 1200 11/21/17 1209 11/21/17 1219 11/21/17 1230  BP: 131/90 (!) 141/93 118/73 123/76  Pulse:      Resp: 20 19 20 18   Temp:   97.9 F (36.6 C) 97.8 F (36.6 C)  TempSrc:      SpO2: 92% 100% 97% 97%  Weight:      Height:        Start Time: 1137 hrs. End Time: 1200 hrs.  Imaging Guidance (Spinal):          Type of Imaging Technique: Fluoroscopy Guidance (Spinal) Indication(s): Assistance in needle guidance and placement for procedures requiring needle placement in or near specific anatomical locations not easily accessible without such assistance. Exposure Time: Please see nurses notes. Contrast: None used. Fluoroscopic  Guidance: I was personally present during the use of fluoroscopy. "Tunnel Vision  Technique" used to obtain the best possible view of the target area. Parallax error corrected before commencing the procedure. "Direction-depth-direction" technique used to introduce the needle under continuous pulsed fluoroscopy. Once target was reached, antero-posterior, oblique, and lateral fluoroscopic projection used confirm needle placement in all planes. Images permanently stored in EMR. Interpretation: No contrast injected. I personally interpreted the imaging intraoperatively. Adequate needle placement confirmed in multiple planes. Permanent images saved into the patient's record.  Antibiotic Prophylaxis:   Anti-infectives (From admission, onward)   None     Indication(s): None identified  Post-operative Assessment:  Post-procedure Vital Signs:  Pulse/HCG Rate: 70(!) 56 Temp: 97.8 F (36.6 C) Resp: 18 BP: 123/76 SpO2: 97 %  EBL: None  Complications: No immediate post-treatment complications observed by team, or reported by patient.  Note: The patient tolerated the entire procedure well. A repeat set of vitals were taken after the procedure and the patient was kept under observation following institutional policy, for this type of procedure. Post-procedural neurological assessment was performed, showing return to baseline, prior to discharge. The patient was provided with post-procedure discharge instructions, including a section on how to identify potential problems. Should any problems arise concerning this procedure, the patient was given instructions to immediately contact us, at any time, without hesitation. In any case, we plan to contact the patient by telephone for a follow-up status report regarding this interventional procedure.  Comments:  No additional relevant information. 5 out of 5 strength bilateral upper extremity: Shoulder abduction, elbow flexion, elbow extension, thumb  extension.  Plan of Care   Imaging Orders     DG C-Arm 1-60 Min-No Report Procedure Orders    No procedure(s) ordered today    Medications ordered for procedure: Meds ordered this encounter  Medications  . lactated ringers infusion 1,000 mL  . fentaNYL (SUBLIMAZE) injection 25-100 mcg    Make sure Narcan is available in the pyxis when using this medication. In the event of respiratory depression (RR< 8/min): Titrate NARCAN (naloxone) in increments of 0.1 to 0.2 mg IV at 2-3 minute intervals, until desired degree of reversal.  . lidocaine (XYLOCAINE) 2 % (with pres) injection 400 mg  . ropivacaine (PF) 2 mg/mL (0.2%) (NAROPIN) injection 10 mL  . dexamethasone (DECADRON) injection 10 mg  . dexamethasone (DECADRON) injection 10 mg   Medications administered: We administered lactated ringers, fentaNYL, lidocaine, ropivacaine (PF) 2 mg/mL (0.2%), dexamethasone, and dexamethasone.  See the medical record for exact dosing, route, and time of administration.  New Prescriptions   No medications on file   Disposition: Discharge home  Discharge Date & Time: 11/21/2017; 1230 hrs.   Physician-requested Follow-up: Return in about 5 weeks (around 12/26/2017) for Post Procedure Evaluation.  Future Appointments  Date Time Provider [place]  12/26/2017  1:45 PM Gillis Santa, MD [facility] None   Primary Care Physician: Letta Median, MD Location: [facility] Note by: Gillis Santa, MD Date: 11/21/2017; Time: 1:43 PM  Disclaimer:  Medicine is not an exact science. The only guarantee in medicine is that nothing is guaranteed. It is important to note that the decision to proceed with this intervention was based on the information collected from the patient. The Data and conclusions were drawn from the patient's questionnaire, the interview, and the physical examination. Because the information was provided in large part by the patient, it cannot be  guaranteed that it has not been purposely or unconsciously manipulated. Every effort has been made to obtain as much relevant data  as possible for this evaluation. It is important to note that the conclusions that lead to this procedure are derived in large part from the available data. Always take into account that the treatment will also be dependent on availability of resources and existing treatment guidelines, considered by other Pain Management Practitioners as being common knowledge and practice, at the time of the intervention. For Medico-Legal purposes, it is also important to point out that variation in procedural techniques and pharmacological choices are the acceptable norm. The indications, contraindications, technique, and results of the above procedure should only be interpreted and judged by a Board-Certified Interventional Pain Specialist with extensive familiarity and expertise in the same exact procedure and technique.

## 2017-11-21 NOTE — Patient Instructions (Signed)

## 2017-11-22 ENCOUNTER — Telehealth: Payer: Self-pay

## 2017-11-22 NOTE — Telephone Encounter (Signed)
Post procedure phone call.  Patient states he is doing good.  

## 2017-11-29 ENCOUNTER — Telehealth: Payer: Self-pay | Admitting: *Deleted

## 2017-11-30 NOTE — Telephone Encounter (Signed)
Patient may having cervical muscle spasms which can happen after bilateral multi-level facet blocks. I recommend continued heat to his neck and trying 4% Aspercreme from OTC.  He can also try taking Hydrocodone (2 tablets) which his prescription allows for.   Patient can also see me tomorrow in clinic for evaluation.

## 2017-11-30 NOTE — Telephone Encounter (Signed)
Spoke with patient.  States his pain has gotten a little better.  Informed him to use heat, aspercreme and to take 2 of the Hydrocodone during this period if he needed it.  Informed patient that we could see him in the clinic topmorrow if he felt like he need to be seen.  Instructed him to call us if he needed to be seen todmorrow.

## 2017-12-06 ENCOUNTER — Other Ambulatory Visit
Admission: RE | Admit: 2017-12-06 | Discharge: 2017-12-06 | Disposition: A | Discharge status: Home / Self Care | Payer: Medicaid Other | Source: Ambulatory Visit | Attending: Specialist | Admitting: Specialist

## 2017-12-06 DIAGNOSIS — M25532 Pain in left wrist: Secondary | ICD-10-CM | POA: Diagnosis present

## 2017-12-06 LAB — URIC ACID: URIC ACID, SERUM: 5.2 mg/dL (ref 3.7–8.6)

## 2017-12-20 ENCOUNTER — Telehealth: Payer: Self-pay | Admitting: *Deleted

## 2017-12-20 NOTE — Telephone Encounter (Signed)
Phone call to patient to let him know that I can either offer him an appt with Bryce Haney this week or he can wait and see Dr Holley Raring on next week.  Patient states he has an appt on 12/26/17 and will get his refills at that time.

## 2017-12-26 ENCOUNTER — Other Ambulatory Visit: Payer: Self-pay

## 2017-12-26 ENCOUNTER — Ambulatory Visit
Payer: Medicaid Other | Attending: Student in an Organized Health Care Education/Training Program | Admitting: Student in an Organized Health Care Education/Training Program

## 2017-12-26 ENCOUNTER — Encounter: Payer: Self-pay | Admitting: Student in an Organized Health Care Education/Training Program

## 2017-12-26 VITALS — BP 131/86 | HR 72 | Temp 98.0°F | Ht 72.0 in | Wt 211.0 lb

## 2017-12-26 DIAGNOSIS — I252 Old myocardial infarction: Secondary | ICD-10-CM | POA: Insufficient documentation

## 2017-12-26 DIAGNOSIS — Z7982 Long term (current) use of aspirin: Secondary | ICD-10-CM | POA: Diagnosis not present

## 2017-12-26 DIAGNOSIS — E782 Mixed hyperlipidemia: Secondary | ICD-10-CM | POA: Insufficient documentation

## 2017-12-26 DIAGNOSIS — G894 Chronic pain syndrome: Secondary | ICD-10-CM

## 2017-12-26 DIAGNOSIS — M5030 Other cervical disc degeneration, unspecified cervical region: Secondary | ICD-10-CM

## 2017-12-26 DIAGNOSIS — I480 Paroxysmal atrial fibrillation: Secondary | ICD-10-CM | POA: Diagnosis not present

## 2017-12-26 DIAGNOSIS — I2510 Atherosclerotic heart disease of native coronary artery without angina pectoris: Secondary | ICD-10-CM | POA: Insufficient documentation

## 2017-12-26 DIAGNOSIS — I803 Phlebitis and thrombophlebitis of lower extremities, unspecified: Secondary | ICD-10-CM | POA: Diagnosis not present

## 2017-12-26 DIAGNOSIS — Z79899 Other long term (current) drug therapy: Secondary | ICD-10-CM | POA: Diagnosis not present

## 2017-12-26 DIAGNOSIS — K219 Gastro-esophageal reflux disease without esophagitis: Secondary | ICD-10-CM | POA: Diagnosis not present

## 2017-12-26 DIAGNOSIS — Z951 Presence of aortocoronary bypass graft: Secondary | ICD-10-CM | POA: Insufficient documentation

## 2017-12-26 DIAGNOSIS — E039 Hypothyroidism, unspecified: Secondary | ICD-10-CM | POA: Diagnosis not present

## 2017-12-26 DIAGNOSIS — M549 Dorsalgia, unspecified: Secondary | ICD-10-CM | POA: Diagnosis not present

## 2017-12-26 DIAGNOSIS — I4891 Unspecified atrial fibrillation: Secondary | ICD-10-CM | POA: Insufficient documentation

## 2017-12-26 DIAGNOSIS — E871 Hypo-osmolality and hyponatremia: Secondary | ICD-10-CM | POA: Insufficient documentation

## 2017-12-26 DIAGNOSIS — J449 Chronic obstructive pulmonary disease, unspecified: Secondary | ICD-10-CM | POA: Insufficient documentation

## 2017-12-26 DIAGNOSIS — M7918 Myalgia, other site: Secondary | ICD-10-CM

## 2017-12-26 DIAGNOSIS — M47812 Spondylosis without myelopathy or radiculopathy, cervical region: Secondary | ICD-10-CM | POA: Diagnosis not present

## 2017-12-26 MED ORDER — HYDROCODONE-ACETAMINOPHEN 10-325 MG PO TABS: 1.0000 | ORAL_TABLET | Freq: Two times a day (BID) | ORAL | 0 refills | Status: DC | PRN

## 2017-12-26 MED ORDER — TIZANIDINE HCL 4 MG PO TABS: 4.0000 mg | ORAL_TABLET | Freq: Two times a day (BID) | ORAL | 2 refills | Status: AC | PRN

## 2017-12-26 NOTE — Patient Instructions (Signed)
You have been given prescription for hydrocodone for 2 refills to last 02/24/2018. Rx for Zanalfex sent to your pharmacy.   ____________________________________________________________________________________________  Preparing for your procedure (without sedation)  Instructions: . Oral Intake: Do not eat or drink anything for at least 3 hours prior to your procedure. . Transportation: Unless otherwise stated by your physician, you may drive yourself after the procedure. . Blood Pressure Medicine: Take your blood pressure medicine with a sip of water the morning of the procedure. . Blood thinners: Notify our staff if you are taking any blood thinners. Depending on which one you take, there will be specific instructions on how and when to stop it. . Diabetics on insulin: Notify the staff so that you can be scheduled 1st case in the morning. If your diabetes requires high dose insulin, take only  of your normal insulin dose the morning of the procedure and notify the staff that you have done so. . Preventing infections: Shower with an antibacterial soap the morning of your procedure.  . Build-up your immune system: Take 1000 mg of Vitamin C with every meal (3 times a day) the day prior to your procedure. Marland Kitchen Antibiotics: Inform the staff if you have a condition or reason that requires you to take antibiotics before dental procedures. . Pregnancy: If you are pregnant, call and cancel the procedure. . Sickness: If you have a cold, fever, or any active infections, call and cancel the procedure. . Arrival: You must be in the facility at least 30 minutes prior to your scheduled procedure. . Children: Do not bring any children with you. . Dress appropriately: Bring dark clothing that you would not mind if they get stained. . Valuables: Do not bring any jewelry or valuables.  Procedure appointments are reserved for interventional treatments only. Marland Kitchen No Prescription Refills. . No medication changes will  be discussed during procedure appointments. . No disability issues will be discussed.  Reasons to call and reschedule or cancel your procedure: (Following these recommendations will minimize the risk of a serious complication.) . Surgeries: Avoid having procedures within 2 weeks of any surgery. (Avoid for 2 weeks before or after any surgery). . Flu Shots: Avoid having procedures within 2 weeks of a flu shots or . (Avoid for 2 weeks before or after immunizations). . Barium: Avoid having a procedure within 7-10 days after having had a radiological study involving the use of radiological contrast. (Myelograms, Barium swallow or enema study). . Heart attacks: Avoid any elective procedures or surgeries for the initial 6 months after a "Myocardial Infarction" (Heart Attack). . Blood thinners: It is imperative that you stop these medications before procedures. Let us know if you if you take any blood thinner.  . Infection: Avoid procedures during or within two weeks of an infection (including chest colds or gastrointestinal problems). Symptoms associated with infections include: Localized redness, fever, chills, night sweats or profuse sweating, burning sensation when voiding, cough, congestion, stuffiness, runny nose, sore throat, diarrhea, nausea, vomiting, cold or Flu symptoms, recent or current infections. It is specially important if the infection is over the area that we intend to treat. Marland Kitchen Heart and lung problems: Symptoms that may suggest an active cardiopulmonary problem include: cough, chest pain, breathing difficulties or shortness of breath, dizziness, ankle swelling, uncontrolled high or unusually low blood pressure, and/or palpitations. If you are experiencing any of these symptoms, cancel your procedure and contact your primary care physician for an evaluation.  Remember:  Regular Business hours are:  Monday  to Thursday 8:00 AM to 4:00 PM  Provider's Schedule: Milinda Pointer, MD:   Procedure days: Tuesday and Thursday 7:30 AM to 4:00 PM  Gillis Santa, MD:  Procedure days: Monday and Wednesday 7:30 AM to 4:00 PM ____________________________________________________________________________________________

## 2017-12-26 NOTE — Progress Notes (Signed)
Patient's Name: Bryce Haney  MRN: [id number]  Referring Provider: Letta Median, MD  DOB: 06-03-1950  PCP: Letta Median, MD  DOS: 12/26/2017  Note by: Gillis Santa, MD  Service setting: Ambulatory outpatient  Specialty: Interventional Pain Management  Location: [facility] (AMB) Pain Management Facility    Patient type: Established   Primary Reason(s) for Visit: Encounter for prescription drug management & post-procedure evaluation of chronic illness with mild to moderate exacerbation(Level of risk: moderate) CC: Follow-up  HPI  Bryce Haney is a 67 y.o. year old, male patient, who comes today for a post-procedure evaluation and medication management. He has Pneumonia; Lower GI bleed; Leukocytosis; Diverticulitis large intestine; Acute renal insufficiency; Hyponatremia; CAD (coronary artery disease); Chronic depression; Coronary artery disease involving autologous artery coronary bypass graft; GERD (gastroesophageal reflux disease); Hypothyroidism, unspecified; Mixed hyperlipidemia; Paroxysmal A-fib ([place]); Phlebitis and thrombophlebitis of superficial vessels of lower extremities, bilateral; S/P CABG x 2; STEMI (ST elevation myocardial infarction) ([place]); Special screening for malignant neoplasms, colon; Spondylosis of cervical region without myelopathy or radiculopathy; DDD (degenerative disc disease), cervical; Cervical facet joint syndrome; and Chronic pain syndrome on their problem list. His primarily concern today is the Follow-up  Pain Assessment: Location: Mid, Upper, Right, Left Back Radiating: Pain starts in back of mid neck radiates to both left and right shoulder blades, down to mid back. Onset: Other (comment) Duration: Chronic pain Quality: Constant, Dull, Sharp(Sharp pain when movement) Severity: 4 /10 (subjective, self-reported pain score)  Note: Reported level is compatible with observation.                         When using our objective Pain Scale, levels between 6 and  10/10 are said to belong in an emergency room, as it progressively worsens from a 6/10, described as severely limiting, requiring emergency care not usually available at an outpatient pain management facility. At a 6/10 level, communication becomes difficult and requires great effort. Assistance to reach the emergency department may be required. Facial flushing and profuse sweating along with potentially dangerous increases in heart rate and blood pressure will be evident. Effect on ADL: "Unable to make quick turns, certain movements, makes it difficult to drive, just very limiting" Timing: Constant Modifying factors: Medications, resting, breathing excerises BP: 131/86  HR: 72  Bryce Haney was last seen on 12/20/2017 for a procedure. During today's appointment we reviewed Bryce Haney's post-procedure results, as well as his outpatient medication regimen.  Further details on both, my assessment(s), as well as the proposed treatment plan, please see below.  Controlled Substance Pharmacotherapy Assessment REMS (Risk Evaluation and Mitigation Strategy)  Analgesic: Hydrocodone 5 mg twice daily as needed MME/day: 10 mg/day.  Bryce Napoleon, RN  12/26/2017  2:03 PM  Sign at close encounter Nursing Pain Medication Assessment:  Safety precautions to be maintained throughout the outpatient stay will include: orient to surroundings, keep bed in low position, maintain call bell within reach at all times, provide assistance with transfer out of bed and ambulation.   Medication Inspection Compliance: Pill count conducted under aseptic conditions, in front of the patient. Neither the pills nor the bottle was removed from the patient's sight at any time. Once count was completed pills were immediately returned to the patient in their original bottle.  Medication #1: Hydrocodone/APAP Pill/Patch Count: 0 of 46 pills remain Pill/Patch Appearance: Markings consistent with prescribed medication Bottle  Appearance: Standard pharmacy container. Clearly labeled. Filled Date: 08 / 30 / 2019 Last  Medication intake:  12/19/17       Pharmacokinetics: Liberation and absorption (onset of action): WNL Distribution (time to peak effect): WNL Metabolism and excretion (duration of action): WNL         Pharmacodynamics: Desired effects: Analgesia: Bryce Haney reports <50% benefit. Functional ability: Patient reports that medication does help, but not nearly as much as he would like Clinically meaningful improvement in function (CMIF): Sustained CMIF goals met Perceived effectiveness: Described as relatively effective but with some room for improvement Undesirable effects: Side-effects or Adverse reactions: None reported Monitoring: [place] PMP: Online review of the past 39-monthperiod conducted. Compliant with practice rules and regulations Last UDS on record: Summary  Date Value Ref Range Status  10/12/2017 FINAL  Final    Comment:    ==================================================================== [facility]RE COMP DRUG ANALYSIS,UR ==================================================================== Test                             Result       Flag       Units Drug Present and Declared for Prescription Verification   Tramadol                       136          EXPECTED   ng/mg creat   O-Desmethyltramadol            68           EXPECTED   ng/mg creat   N-Desmethyltramadol            112          EXPECTED   ng/mg creat    Source of tramadol is a prescription medication.    O-desmethyltramadol and N-desmethyltramadol are expected    metabolites of tramadol.   Gabapentin                     PRESENT      EXPECTED   Mirtazapine                    PRESENT      EXPECTED   Acetaminophen                  PRESENT      EXPECTED   Metoprolol                     PRESENT      EXPECTED Drug Present not Declared for Prescription Verification   Carboxy-THC                    140          UNEXPECTED  ng/mg creat    Carboxy-THC is a metabolite of tetrahydrocannabinol  (THC).    Source of [facility] most commonly illicit, but THC is also present    in a scheduled prescription medication. Drug Absent but Declared for Prescription Verification   Oxycodone                      Not Detected UNEXPECTED ng/mg creat   Tizanidine                     Not Detected UNEXPECTED    Tizanidine, as indicated in the declared medication list, is not    always detected even when used as directed.   Methocarbamol  Not Detected UNEXPECTED ==================================================================== Test                      Result    Flag   Units      Ref Range   Creatinine              168              mg/dL      >=20 ==================================================================== Declared Medications:  The flagging and interpretation on this report are based on the  following declared medications.  Unexpected results may arise from  inaccuracies in the declared medications.  **Note: The testing scope of this panel includes these medications:  Gabapentin (Neurontin)  Methocarbamol (Robaxin)  Metoprolol (Lopressor)  Mirtazapine (Remeron)  Oxycodone (Percocet)  Tramadol (Ultram)  **Note: The testing scope of this panel does not include small to  moderate amounts of these reported medications:  Acetaminophen (Percocet)  Acetaminophen (Tylenol)  Tizanidine (Zanaflex)  **Note: The testing scope of this panel does not include following  reported medications:  Atorvastatin (Lipitor)  Ciprofloxacin (Cipro)  Furosemide (Lasix)  Levothyroxine  Methylprednisolone (Medrol)  Metronidazole (Flagyl)  Nicotine (Nicoderm)  Omeprazole (Prilosec)  Ranitidine (Zantac) ==================================================================== For clinical consultation, please call [phone]. ====================================================================    UDS interpretation:  Compliant          Medication Assessment Form: Reviewed. Patient indicates being compliant with therapy Treatment compliance: Compliant Risk Assessment Profile: Aberrant behavior: See prior evaluations. None observed or detected today Comorbid factors increasing risk of overdose: See prior notes. No additional risks detected today Opioid risk tool (ORT) (Total Score): 1 Personal History of Substance Abuse (SUD-Substance use disorder):  Alcohol: Negative  Illegal Drugs: Negative  Rx Drugs: Negative  ORT Risk Level calculation: Low Risk Risk of substance use disorder (SUD): Low Opioid Risk Tool - 12/26/17 1400      Family History of Substance Abuse   Alcohol  Negative    Illegal Drugs  Negative    Rx Drugs  Negative      Personal History of Substance Abuse   Alcohol  Negative    Illegal Drugs  Negative    Rx Drugs  Negative      Age   Age between 53-45 years   No      History of Preadolescent Sexual Abuse   History of Preadolescent Sexual Abuse  Negative or Male      Psychological Disease   Psychological Disease  Negative    Depression  Positive      Total Score   Opioid Risk Tool Scoring  1    Opioid Risk Interpretation  Low Risk      ORT Scoring interpretation table:  Score <3 = Low Risk for SUD  Score between 4-7 = Moderate Risk for SUD  Score >8 = High Risk for Opioid Abuse   Risk Mitigation Strategies:  Patient Counseling: Covered Patient-Prescriber Agreement (PPA): Present and active  Notification to other healthcare providers: Done  Pharmacologic Plan: Will increase to hydrocodone 10 mg twice daily as needed, quantity 60/month             Post-Procedure Assessment  11/21/2017 Procedure: Bilateral C4, C5, C6, C7 cervical facet medial branch nerve blocks Pre-procedure pain score:  3/10 Post-procedure pain score: 0/10         Influential Factors: BMI: 28.62 kg/m Intra-procedural challenges: None observed.         Assessment challenges: None detected.  Reported side-effects: None.        Post-procedural adverse reactions or complications: None reported         Sedation: Please see nurses note. When no sedatives are used, the analgesic levels obtained are directly associated to the effectiveness of the local anesthetics. However, when sedation is provided, the level of analgesia obtained during the initial 1 hour following the intervention, is believed to be the result of a combination of factors. These factors may include, but are not limited to: 1. The effectiveness of the local anesthetics used. 2. The effects of the analgesic(s) and/or anxiolytic(s) used. 3. The degree of discomfort experienced by the patient at the time of the procedure. 4. The patients ability and reliability in recalling and recording the events. 5. The presence and influence of possible secondary gains and/or psychosocial factors. Reported result: Relief experienced during the 1st hour after the procedure: 100 % (Ultra-Short Term Relief)            Interpretative annotation: Clinically appropriate result. Analgesia during this period is likely to be Local Anesthetic and/or IV Sedative (Analgesic/Anxiolytic) related.          Effects of local anesthetic: The analgesic effects attained during this period are directly associated to the localized infiltration of local anesthetics and therefore cary significant diagnostic value as to the etiological location, or anatomical origin, of the pain. Expected duration of relief is directly dependent on the pharmacodynamics of the local anesthetic used. Long-acting (4-6 hours) anesthetics used.  Reported result: Relief during the next 4 to 6 hour after the procedure: 100 % (Short-Term Relief)            Interpretative annotation: Clinically appropriate result. Analgesia during this period is likely to be Local Anesthetic-related.          Long-term benefit: Defined as the period of time past the expected duration of local  anesthetics (1 hour for short-acting and 4-6 hours for long-acting). With the possible exception of prolonged sympathetic blockade from the local anesthetics, benefits during this period are typically attributed to, or associated with, other factors such as analgesic sensory neuropraxia, antiinflammatory effects, or beneficial biochemical changes provided by agents other than the local anesthetics.  Reported result: Extended relief following procedure: 0 %("Reflief for 4 days only") (Long-Term Relief)            Interpretative annotation: Unexpected result. Partial relief. Incomplete therapeutic success. Pain appears to be refractory to this treatment modality.          Current benefits: Defined as reported results that persistent at this point in time.   Analgesia: 0 %            Function: No benefit ROM: No benefit Interpretative annotation: No benefit. Therapeutic failure. Results would argue against repeating therapy.          Interpretation: Results would suggest failure of therapy in achieving desired goal(s).                  Plan:  Please see "Plan of Care" for details.                Laboratory Chemistry  Inflammation Markers (CRP: Acute Phase) (ESR: Chronic Phase) Lab Results  Component Value Date   ESRSEDRATE 27 (H) 09/07/2012   LATICACIDVEN 0.9 06/19/2015                         Rheumatology Markers Lab Results  Component Value Date  LABURIC 5.2 12/06/2017                        Renal Function Markers Lab Results  Component Value Date   BUN 19 10/20/2016   CREATININE 1.03 10/20/2016   GFRAA >60 10/20/2016   GFRNONAA >60 10/20/2016                             Hepatic Function Markers Lab Results  Component Value Date   AST 17 10/19/2016   ALT 20 10/19/2016   ALBUMIN 4.3 10/19/2016   ALKPHOS 75 10/19/2016                        Electrolytes Lab Results  Component Value Date   NA 134 (L) 10/20/2016   K 4.2 10/20/2016   CL 108 10/20/2016   CALCIUM 8.5 (L)  10/20/2016                        Neuropathy Markers No results found for: VITAMINB12, FOLATE, HGBA1C, HIV                      CNS Tests No results found for: COLORCSF, APPEARCSF, RBCCOUNTCSF, WBCCSF, POLYSCSF, LYMPHSCSF, EOSCSF, PROTEINCSF, GLUCCSF, JCVIRUS, CSFOLI, IGGCSF                      Bone Pathology Markers No results found for: VD25OH, [id number], [id number], [id number], 25OHVITD1, 25OHVITD2, 25OHVITD3, TESTOFREE, TESTOSTERONE                       Coagulation Parameters Lab Results  Component Value Date   INR 0.91 10/19/2016   LABPROT 12.2 10/19/2016   APTT 33 05/22/2015   PLT 225 10/21/2016                        Cardiovascular Markers Lab Results  Component Value Date   BNP 240 (H) 11/26/2012   CKTOTAL 107 11/27/2012   CKMB 11.6 (H) 11/27/2012   TROPONINI <0.03 10/20/2016   HGB 14.0 10/21/2016   HCT 41.2 10/21/2016                         CA Markers No results found for: CEA, CA125, LABCA2                      Note: Lab results reviewed.  Recent Diagnostic Imaging Results  DG C-Arm 1-60 Min-No Report Fluoroscopy was utilized by the requesting physician.  No radiographic  interpretation.   Complexity Note: Imaging results reviewed. Results shared with Mr. Schappell, using Layman's terms.                         Meds   Current Outpatient Medications:  .  aspirin 81 MG tablet, Take 81 mg by mouth daily., Disp: , Rfl:  .  atorvastatin (LIPITOR) 40 MG tablet, Take 40 mg by mouth daily., Disp: , Rfl:  .  furosemide (LASIX) 20 MG tablet, Take 20 mg by mouth 2 (two) times daily., Disp: , Rfl:  .  gabapentin (NEURONTIN) 300 MG capsule, Take 300 mg by mouth at bedtime. , Disp: , Rfl:  .  levothyroxine (SYNTHROID, LEVOTHROID) 150 MCG tablet, Take 150 mcg by mouth daily  before breakfast., Disp: , Rfl:  .  metoprolol tartrate (LOPRESSOR) 25 MG tablet, Take 12.5 mg by mouth 2 (two) times daily., Disp: , Rfl:  .  mirtazapine (REMERON) 30 MG tablet, Take 30 mg by  mouth at bedtime., Disp: , Rfl:  .  omeprazole (PRILOSEC) 20 MG capsule, Take 20 mg by mouth daily., Disp: , Rfl:  .  ranitidine (ZANTAC) 150 MG tablet, Take by mouth., Disp: , Rfl:  .  acetaminophen (TYLENOL) 325 MG tablet, Take 1 tablet (325 mg total) by mouth every 6 (six) hours as needed for mild pain (or Fever >/= 101). (Patient not taking: Reported on 11/21/2017), Disp: , Rfl:  .  HYDROcodone-acetaminophen (NORCO) 10-325 MG tablet, Take 1 tablet by mouth 2 (two) times daily as needed for severe pain. To fill on or after: 12/26/2017, 01/25/2018, Disp: 60 tablet, Rfl: 0 .  tiZANidine (ZANAFLEX) 4 MG tablet, Take 1 tablet (4 mg total) by mouth 2 (two) times daily as needed for muscle spasms., Disp: 60 tablet, Rfl: 2  ROS  Constitutional: Denies any fever or chills Gastrointestinal: No reported hemesis, hematochezia, vomiting, or acute GI distress Musculoskeletal: Denies any acute onset joint swelling, redness, loss of ROM, or weakness Neurological: No reported episodes of acute onset apraxia, aphasia, dysarthria, agnosia, amnesia, paralysis, loss of coordination, or loss of consciousness  Allergies  Mr. Adduci is allergic to morphine and related.  [place]  Drug: Mr. Scalzo  reports that he does not use drugs. Alcohol:  reports that he does not drink alcohol. Tobacco:  reports that he has been smoking. He has a 35.00 pack-year smoking history. He has never used smokeless tobacco. Medical:  has a past medical history of Atrial fibrillation ([place]), Back pain, CHF (congestive heart failure) ([place]), COPD (chronic obstructive pulmonary disease) ([place]), Hyperlipidemia, Myocardial infarction ([place]), S/P CABG (coronary artery bypass graft), and Thyroid disease. Surgical: Mr. Skorupski  has a past surgical history that includes Coronary angioplasty; Cardiac catheterization (N/A, 07/24/2014); Coronary angioplasty with stent; Coronary artery bypass graft; Joint replacement (2010); Joint replacement (2016);  Colonoscopy with propofol (N/A, 12/30/2016); and Shoulder surgery (Right). Family: family history includes Heart disease in his father and mother.  Constitutional Exam  General appearance: Well nourished, well developed, and well hydrated. In no apparent acute distress Vitals:   12/26/17 1352  BP: 131/86  Pulse: 72  Temp: 98 F (36.7 C)  SpO2: 95%  Weight: 211 lb (95.7 kg)  Height: 6' (1.829 m)   BMI Assessment: Estimated body mass index is 28.62 kg/m as calculated from the following:   Height as of this encounter: 6' (1.829 m).   Weight as of this encounter: 211 lb (95.7 kg).  BMI interpretation table: BMI level Category Range association with higher incidence of chronic pain  <18 kg/m2 Underweight   18.5-24.9 kg/m2 Ideal body weight   25-29.9 kg/m2 Overweight Increased incidence by 20%  30-34.9 kg/m2 Obese (Class I) Increased incidence by 68%  35-39.9 kg/m2 Severe obesity (Class II) Increased incidence by 136%  >40 kg/m2 Extreme obesity (Class III) Increased incidence by 254%   Patient's current BMI Ideal Body weight  Body mass index is 28.62 kg/m. Ideal body weight: 77.6 kg (171 lb 1.2 oz) Adjusted ideal body weight: 84.8 kg (187 lb 0.7 oz)   BMI Readings from Last 4 Encounters:  12/26/17 28.62 kg/m  11/21/17 28.48 kg/m  10/27/17 28.48 kg/m  10/12/17 31.19 kg/m   Wt Readings from Last 4 Encounters:  12/26/17 211 lb (95.7 kg)  11/21/17  210 lb (95.3 kg)  10/27/17 210 lb (95.3 kg)  10/12/17 230 lb (104.3 kg)  Psych/Mental status: Alert, oriented x 3 (person, place, & time)       Eyes: PERLA Respiratory: No evidence of acute respiratory distress  Cervical Spine Area Exam  Skin & Axial Inspection: No masses, redness, edema, swelling, or associated skin lesions Alignment: Symmetrical Functional ROM: Decreased ROM, bilaterally Stability: No instability detected Muscle Tone/Strength: Functionally intact. No obvious neuro-muscular anomalies detected. Sensory  (Neurological): Musculoskeletal pain pattern Palpation: Complains of area being tender to palpation Positive provocative maneuver for for cervical facet disease  Upper Extremity (UE) Exam    Side: Right upper extremity  Side: Left upper extremity  Skin & Extremity Inspection: Skin color, temperature, and hair growth are WNL. No peripheral edema or cyanosis. No masses, redness, swelling, asymmetry, or associated skin lesions. No contractures.  Skin & Extremity Inspection: Skin color, temperature, and hair growth are WNL. No peripheral edema or cyanosis. No masses, redness, swelling, asymmetry, or associated skin lesions. No contractures.  Functional ROM: Unrestricted ROM          Functional ROM: Unrestricted ROM          Muscle Tone/Strength: Functionally intact. No obvious neuro-muscular anomalies detected.  Muscle Tone/Strength: Functionally intact. No obvious neuro-muscular anomalies detected.  Sensory (Neurological): Unimpaired          Sensory (Neurological): Unimpaired          Palpation: No palpable anomalies              Palpation: No palpable anomalies              Provocative Test(s):  Phalen's test: deferred Tinel's test: deferred Apley's scratch test (touch opposite shoulder):  Action 1 (Across chest): deferred Action 2 (Overhead): deferred Action 3 (LB reach): deferred   Provocative Test(s):  Phalen's test: deferred Tinel's test: deferred Apley's scratch test (touch opposite shoulder):  Action 1 (Across chest): deferred Action 2 (Overhead): deferred Action 3 (LB reach): deferred    Thoracic Spine Area Exam  Skin & Axial Inspection: No masses, redness, or swelling Alignment: Symmetrical Functional ROM: Unrestricted ROM Stability: No instability detected Muscle Tone/Strength: Functionally intact. No obvious neuro-muscular anomalies detected. Sensory (Neurological): Unimpaired Muscle strength & Tone: No palpable anomalies  Lumbar Spine Area Exam  Skin & Axial Inspection:  No masses, redness, or swelling Alignment: Symmetrical Functional ROM: Unrestricted ROM       Stability: No instability detected Muscle Tone/Strength: Functionally intact. No obvious neuro-muscular anomalies detected. Sensory (Neurological): Unimpaired Palpation: No palpable anomalies       Provocative Tests: Hyperextension/rotation test: deferred today       Lumbar quadrant test (Kemp's test): deferred today       Lateral bending test: deferred today       Patrick's Maneuver: deferred today                   FABER test: deferred today                   S-I anterior distraction/compression test: deferred today         S-I lateral compression test: deferred today         S-I Thigh-thrust test: deferred today         S-I Gaenslen's test: deferred today          Gait & Posture Assessment  Ambulation: Unassisted Gait: Relatively normal for age and body habitus Posture: WNL   Lower  Extremity Exam    Side: Right lower extremity  Side: Left lower extremity  Stability: No instability observed          Stability: No instability observed          Skin & Extremity Inspection: Skin color, temperature, and hair growth are WNL. No peripheral edema or cyanosis. No masses, redness, swelling, asymmetry, or associated skin lesions. No contractures.  Skin & Extremity Inspection: Skin color, temperature, and hair growth are WNL. No peripheral edema or cyanosis. No masses, redness, swelling, asymmetry, or associated skin lesions. No contractures.  Functional ROM: Unrestricted ROM                  Functional ROM: Unrestricted ROM                  Muscle Tone/Strength: Functionally intact. No obvious neuro-muscular anomalies detected.  Muscle Tone/Strength: Functionally intact. No obvious neuro-muscular anomalies detected.  Sensory (Neurological): Unimpaired  Sensory (Neurological): Unimpaired  Palpation: No palpable anomalies  Palpation: No palpable anomalies   Assessment  Primary Diagnosis & Pertinent  Problem List: The primary encounter diagnosis was Myofascial pain. Diagnoses of Spondylosis of cervical region without myelopathy or radiculopathy, Chronic pain syndrome, and DDD (degenerative disc disease), cervical were also pertinent to this visit.  Status Diagnosis  Persistent Persistent Persistent 1. Myofascial pain   2. Spondylosis of cervical region without myelopathy or radiculopathy   3. Chronic pain syndrome   4. DDD (degenerative disc disease), cervical     General Recommendations: The pain condition that the patient suffers from is best treated with a multidisciplinary approach that involves an increase in physical activity to prevent de-conditioning and worsening of the pain cycle, as well as psychological counseling (formal and/or informal) to address the co-morbid psychological affects of pain. Treatment will often involve judicious use of pain medications and interventional procedures to decrease the pain, allowing the patient to participate in the physical activity that will ultimately produce long-lasting pain reductions. The goal of the multidisciplinary approach is to return the patient to a higher level of overall function and to restore their ability to perform activities of daily living.  67 year old male who follows up status post diagnostic cervical facet medial branch nerve block 1 at C4, C5, C6, C7.  Patient states that for the first 4 days after his block, he had 100% pain relief but then on the fifth day he had a sudden increase in his neck pain that resulted in headaches and worsening shoulder pain for the next 4 to 5 days with return to baseline pain thereafter.  Given that the patient did not experience a positive diagnostic cervical facet block, do not recommend repeating block or proceeding with radiofrequency ablation.  Given that the patient did experience pain relief for 4 to 5 days, and given that the patient has tenderness along various regions in his cervical and  parascapular area, patient may benefit from trigger point injections in this area.  He states that he has had them in the past and they were effective.  In regards to medication management, given that the patient has having worsening neck pain, will increase his hydrocodone to 10 mg twice daily as needed, prescription provided for 2 months.  We will also refill Zanaflex as below.  NSAIDs contraindicated given history of CABG.  Plan of Care  Pharmacotherapy (Medications Ordered): Meds ordered this encounter  Medications  . HYDROcodone-acetaminophen (NORCO) 10-325 MG tablet    Sig: Take 1 tablet by mouth  2 (two) times daily as needed for severe pain. To fill on or after: 12/26/2017, 01/25/2018    Dispense:  60 tablet    Refill:  0    Do not place this medication, or any other prescription from our practice, on "Automatic Refill". Patient may have prescription filled one day early if pharmacy is closed on scheduled refill date.  Marland Kitchen tiZANidine (ZANAFLEX) 4 MG tablet    Sig: Take 1 tablet (4 mg total) by mouth 2 (two) times daily as needed for muscle spasms.    Dispense:  60 tablet    Refill:  2   Lab-work, procedure(s), and/or referral(s): Orders Placed This Encounter  Procedures  . TRIGGER POINT INJECTION    Time Note: Greater than 50% of the 25 minute(s) of face-to-face time spent with Mr. Stamour, was spent in counseling/coordination of care regarding: Mr. Granzow primary cause of pain, the treatment plan, treatment alternatives, the risks and possible complications of proposed treatment, medication side effects, going over the informed consent, the opioid analgesic risks and possible complications, the results, interpretation and significance of  his recent diagnostic interventional treatment(s), the appropriate use of his medications, realistic expectations, the goals of pain management (increased in functionality), the medication agreement and the patient's responsibilities when it comes  to controlled substances.  Provider-requested follow-up: Return in about 2 weeks (around 01/09/2018) for Procedure.  Future Appointments  Date Time Provider [place]  01/09/2018 10:30 AM Gillis Santa, MD [facility] None  02/23/2018 10:30 AM Gillis Santa, MD [facility] None    Primary Care Physician: Letta Median, MD Location: [facility] Note by: Gillis Santa, M.D Date: 12/26/2017; Time: 3:32 PM  Patient Instructions  You have been given prescription for hydrocodone for 2 refills to last 02/24/2018. Rx for Zanalfex sent to your pharmacy.   ____________________________________________________________________________________________  Preparing for your procedure (without sedation)  Instructions: . Oral Intake: Do not eat or drink anything for at least 3 hours prior to your procedure. . Transportation: Unless otherwise stated by your physician, you may drive yourself after the procedure. . Blood Pressure Medicine: Take your blood pressure medicine with a sip of water the morning of the procedure. . Blood thinners: Notify our staff if you are taking any blood thinners. Depending on which one you take, there will be specific instructions on how and when to stop it. . Diabetics on insulin: Notify the staff so that you can be scheduled 1st case in the morning. If your diabetes requires high dose insulin, take only  of your normal insulin dose the morning of the procedure and notify the staff that you have done so. . Preventing infections: Shower with an antibacterial soap the morning of your procedure.  . Build-up your immune system: Take 1000 mg of Vitamin C with every meal (3 times a day) the day prior to your procedure. Marland Kitchen Antibiotics: Inform the staff if you have a condition or reason that requires you to take antibiotics before dental procedures. . Pregnancy: If you are pregnant, call and cancel the procedure. . Sickness: If you have a cold,  fever, or any active infections, call and cancel the procedure. . Arrival: You must be in the facility at least 30 minutes prior to your scheduled procedure. . Children: Do not bring any children with you. . Dress appropriately: Bring dark clothing that you would not mind if they get stained. . Valuables: Do not bring any jewelry or valuables.  Procedure appointments are reserved for interventional treatments  only. . No Prescription Refills. . No medication changes will be discussed during procedure appointments. . No disability issues will be discussed.  Reasons to call and reschedule or cancel your procedure: (Following these recommendations will minimize the risk of a serious complication.) . Surgeries: Avoid having procedures within 2 weeks of any surgery. (Avoid for 2 weeks before or after any surgery). . Flu Shots: Avoid having procedures within 2 weeks of a flu shots or . (Avoid for 2 weeks before or after immunizations). . Barium: Avoid having a procedure within 7-10 days after having had a radiological study involving the use of radiological contrast. (Myelograms, Barium swallow or enema study). . Heart attacks: Avoid any elective procedures or surgeries for the initial 6 months after a "Myocardial Infarction" (Heart Attack). . Blood thinners: It is imperative that you stop these medications before procedures. Let us know if you if you take any blood thinner.  . Infection: Avoid procedures during or within two weeks of an infection (including chest colds or gastrointestinal problems). Symptoms associated with infections include: Localized redness, fever, chills, night sweats or profuse sweating, burning sensation when voiding, cough, congestion, stuffiness, runny nose, sore throat, diarrhea, nausea, vomiting, cold or Flu symptoms, recent or current infections. It is specially important if the infection is over the area that we intend to treat. Marland Kitchen Heart and lung problems: Symptoms that may  suggest an active cardiopulmonary problem include: cough, chest pain, breathing difficulties or shortness of breath, dizziness, ankle swelling, uncontrolled high or unusually low blood pressure, and/or palpitations. If you are experiencing any of these symptoms, cancel your procedure and contact your primary care physician for an evaluation.  Remember:  Regular Business hours are:  Monday to Thursday 8:00 AM to 4:00 PM  Provider's Schedule: Milinda Pointer, MD:  Procedure days: Tuesday and Thursday 7:30 AM to 4:00 PM  Gillis Santa, MD:  Procedure days: Monday and Wednesday 7:30 AM to 4:00 PM ____________________________________________________________________________________________

## 2017-12-26 NOTE — Progress Notes (Signed)
Nursing Pain Medication Assessment:  Safety precautions to be maintained throughout the outpatient stay will include: orient to surroundings, keep bed in low position, maintain call bell within reach at all times, provide assistance with transfer out of bed and ambulation.   Medication Inspection Compliance: Pill count conducted under aseptic conditions, in front of the patient. Neither the pills nor the bottle was removed from the patient's sight at any time. Once count was completed pills were immediately returned to the patient in their original bottle.  Medication #1: Hydrocodone/APAP Pill/Patch Count: 0 of 46 pills remain Pill/Patch Appearance: Markings consistent with prescribed medication Bottle Appearance: Standard pharmacy container. Clearly labeled. Filled Date: 08 / 30 / 2019 Last Medication intake:  12/19/17

## 2018-01-09 ENCOUNTER — Ambulatory Visit: Payer: Medicaid Other | Admitting: Student in an Organized Health Care Education/Training Program

## 2018-02-23 ENCOUNTER — Encounter: Payer: Self-pay | Admitting: Student in an Organized Health Care Education/Training Program

## 2018-02-23 ENCOUNTER — Ambulatory Visit
Payer: Medicare Other | Attending: Student in an Organized Health Care Education/Training Program | Admitting: Student in an Organized Health Care Education/Training Program

## 2018-02-23 ENCOUNTER — Other Ambulatory Visit: Payer: Self-pay

## 2018-02-23 VITALS — BP 108/80 | HR 72 | Temp 98.3°F | Resp 18 | Ht 72.0 in | Wt 228.0 lb

## 2018-02-23 DIAGNOSIS — I509 Heart failure, unspecified: Secondary | ICD-10-CM | POA: Diagnosis not present

## 2018-02-23 DIAGNOSIS — G894 Chronic pain syndrome: Secondary | ICD-10-CM | POA: Insufficient documentation

## 2018-02-23 DIAGNOSIS — J449 Chronic obstructive pulmonary disease, unspecified: Secondary | ICD-10-CM | POA: Insufficient documentation

## 2018-02-23 DIAGNOSIS — F17200 Nicotine dependence, unspecified, uncomplicated: Secondary | ICD-10-CM | POA: Diagnosis not present

## 2018-02-23 DIAGNOSIS — Z885 Allergy status to narcotic agent status: Secondary | ICD-10-CM | POA: Diagnosis not present

## 2018-02-23 DIAGNOSIS — I25810 Atherosclerosis of coronary artery bypass graft(s) without angina pectoris: Secondary | ICD-10-CM | POA: Insufficient documentation

## 2018-02-23 DIAGNOSIS — Z79899 Other long term (current) drug therapy: Secondary | ICD-10-CM | POA: Insufficient documentation

## 2018-02-23 DIAGNOSIS — K219 Gastro-esophageal reflux disease without esophagitis: Secondary | ICD-10-CM | POA: Insufficient documentation

## 2018-02-23 DIAGNOSIS — Z7982 Long term (current) use of aspirin: Secondary | ICD-10-CM | POA: Insufficient documentation

## 2018-02-23 DIAGNOSIS — M5030 Other cervical disc degeneration, unspecified cervical region: Secondary | ICD-10-CM

## 2018-02-23 DIAGNOSIS — Z8249 Family history of ischemic heart disease and other diseases of the circulatory system: Secondary | ICD-10-CM | POA: Diagnosis not present

## 2018-02-23 DIAGNOSIS — Z951 Presence of aortocoronary bypass graft: Secondary | ICD-10-CM | POA: Diagnosis not present

## 2018-02-23 DIAGNOSIS — M7918 Myalgia, other site: Secondary | ICD-10-CM | POA: Insufficient documentation

## 2018-02-23 DIAGNOSIS — E782 Mixed hyperlipidemia: Secondary | ICD-10-CM | POA: Insufficient documentation

## 2018-02-23 DIAGNOSIS — I480 Paroxysmal atrial fibrillation: Secondary | ICD-10-CM | POA: Diagnosis not present

## 2018-02-23 DIAGNOSIS — I252 Old myocardial infarction: Secondary | ICD-10-CM | POA: Insufficient documentation

## 2018-02-23 DIAGNOSIS — Z955 Presence of coronary angioplasty implant and graft: Secondary | ICD-10-CM | POA: Insufficient documentation

## 2018-02-23 DIAGNOSIS — M47816 Spondylosis without myelopathy or radiculopathy, lumbar region: Secondary | ICD-10-CM

## 2018-02-23 DIAGNOSIS — M47812 Spondylosis without myelopathy or radiculopathy, cervical region: Secondary | ICD-10-CM | POA: Insufficient documentation

## 2018-02-23 MED ORDER — HYDROCODONE-ACETAMINOPHEN 10-325 MG PO TABS: 1.0000 | ORAL_TABLET | Freq: Two times a day (BID) | ORAL | 0 refills | Status: DC | PRN

## 2018-02-23 MED ORDER — HYDROCODONE-ACETAMINOPHEN 10-325 MG PO TABS: 1.0000 | ORAL_TABLET | Freq: Two times a day (BID) | ORAL | 0 refills | Status: AC | PRN

## 2018-02-23 NOTE — Progress Notes (Signed)
Nursing Pain Medication Assessment:  Safety precautions to be maintained throughout the outpatient stay will include: orient to surroundings, keep bed in low position, maintain call bell within reach at all times, provide assistance with transfer out of bed and ambulation.  Medication Inspection Compliance: Pill count conducted under aseptic conditions, in front of the patient. Neither the pills nor the bottle was removed from the patient's sight at any time. Once count was completed pills were immediately returned to the patient in their original bottle.  Medication: Hydrocodone/APAP Pill/Patch Count: 0 of 60 pills remain Pill/Patch Appearance: Markings consistent with prescribed medication Bottle Appearance: Standard pharmacy container. Clearly labeled. Filled Date: 10 / 21 / 2019 Last Medication intake:  02/15/2018

## 2018-02-23 NOTE — Progress Notes (Signed)
Patient's Name: Bryce Haney  MRN: [id number]  Referring Provider: Letta Median, MD  DOB: Jan 16, 1951  PCP: Letta Median, MD  DOS: 02/23/2018  Note by: Gillis Santa, MD  Service setting: Ambulatory outpatient  Specialty: Interventional Pain Management  Location: [facility] (AMB) Pain Management Facility    Patient type: Established   Primary Reason(s) for Visit: Encounter for prescription drug management. (Level of risk: moderate)  CC: Neck Pain and Back Pain (upper)  HPI  Bryce Haney is a 67 y.o. year old, male patient, who comes today for a medication management evaluation. He has Pneumonia; Lower GI bleed; Leukocytosis; Diverticulitis large intestine; Acute renal insufficiency; Hyponatremia; CAD (coronary artery disease); Chronic depression; Coronary artery disease involving autologous artery coronary bypass graft; GERD (gastroesophageal reflux disease); Hypothyroidism, unspecified; Mixed hyperlipidemia; Paroxysmal A-fib ([place]); Phlebitis and thrombophlebitis of superficial vessels of lower extremities, bilateral; S/P CABG x 2; STEMI (ST elevation myocardial infarction) ([place]); Special screening for malignant neoplasms, colon; Spondylosis of cervical region without myelopathy or radiculopathy; DDD (degenerative disc disease), cervical; Cervical facet joint syndrome; and Chronic pain syndrome on their problem list. His primarily concern today is the Neck Pain and Back Pain (upper)  Pain Assessment: Location:   Neck Radiating: Pain starts in back of neck then radiates to both left and right shoulder blades, down to mid back Onset: More than a month ago Duration: Chronic pain Quality: Constant, Sharp Severity: 2 /10 (subjective, self-reported pain score)  Note: Reported level is compatible with observation.                         When using our objective Pain Scale, levels between 6 and 10/10 are said to belong in an emergency room, as it progressively worsens from a 6/10, described as  severely limiting, requiring emergency care not usually available at an outpatient pain management facility. At a 6/10 level, communication becomes difficult and requires great effort. Assistance to reach the emergency department may be required. Facial flushing and profuse sweating along with potentially dangerous increases in heart rate and blood pressure will be evident. Effect on ADL: Unable to make quick turns, certain movements exacerbate pain. Timing: Constant Modifying factors: meds, resting BP: 108/80  HR: 72  [name] was last scheduled for an appointment on 12/26/2017 for medication management. During today's appointment we reviewed [name]'s chronic pain status, as well as his outpatient medication regimen.  The patient  reports no history of drug use. His body mass index is 30.92 kg/m.  Further details on both, my assessment(s), as well as the proposed treatment plan, please see below.  Controlled Substance Pharmacotherapy Assessment REMS (Risk Evaluation and Mitigation Strategy)  Analgesic: Hydrocodone 10 mg twice daily as needed, quantity 60/month MME/day: 20 mg/day.  Rise Patience, RN  02/23/2018 10:51 AM  Signed Nursing Pain Medication Assessment:  Safety precautions to be maintained throughout the outpatient stay will include: orient to surroundings, keep bed in low position, maintain call bell within reach at all times, provide assistance with transfer out of bed and ambulation.  Medication Inspection Compliance: Pill count conducted under aseptic conditions, in front of the patient. Neither the pills nor the bottle was removed from the patient's sight at any time. Once count was completed pills were immediately returned to the patient in their original bottle.  Medication: Hydrocodone/APAP Pill/Patch Count: 0 of 60 pills remain Pill/Patch Appearance: Markings consistent with prescribed medication Bottle Appearance: Standard pharmacy container. Clearly  labeled. Filled Date: 67 /  21 / 2019 Last Medication intake:  02/15/2018   Pharmacokinetics: Liberation and absorption (onset of action): WNL Distribution (time to peak effect): WNL Metabolism and excretion (duration of action): WNL         Pharmacodynamics: Desired effects: Analgesia: [name] reports >50% benefit. Functional ability: Patient reports that medication allows him to accomplish basic ADLs Clinically meaningful improvement in function (CMIF): Sustained CMIF goals met Perceived effectiveness: Described as relatively effective, allowing for increase in activities of daily living (ADL) Undesirable effects: Side-effects or Adverse reactions: None reported Monitoring: [place] PMP: Online review of the past 71-monthperiod conducted. Compliant with practice rules and regulations Last UDS on record: Summary  Date Value Ref Range Status  10/12/2017 FINAL  Final    Comment:    ==================================================================== TOXASSURE COMP DRUG ANALYSIS,UR ==================================================================== Test                             Result       Flag       Units Drug Present and Declared for Prescription Verification   Tramadol                       136          EXPECTED   ng/mg creat   O-Desmethyltramadol            68           EXPECTED   ng/mg creat   N-Desmethyltramadol            112          EXPECTED   ng/mg creat    Source of tramadol is a prescription medication.    O-desmethyltramadol and N-desmethyltramadol are expected    metabolites of tramadol.   Gabapentin                     PRESENT      EXPECTED   Mirtazapine                    PRESENT      EXPECTED   Acetaminophen                  PRESENT      EXPECTED   Metoprolol                     PRESENT      EXPECTED Drug Present not Declared for Prescription Verification   Carboxy-THC                    140          UNEXPECTED ng/mg creat    Carboxy-THC is a metabolite of  tetrahydrocannabinol  (THC).    [facility] of [facility] most commonly illicit, but THC is also present    in a scheduled prescription medication. Drug Absent but Declared for Prescription Verification   Oxycodone                      Not Detected UNEXPECTED ng/mg creat   Tizanidine                     Not Detected UNEXPECTED    Tizanidine, as indicated in the declared medication list, is not    always detected even when used as directed.   Methocarbamol  Not Detected UNEXPECTED ==================================================================== Test                      Result    Flag   Units      Ref Range   Creatinine              168              mg/dL      >=20 ==================================================================== Declared Medications:  The flagging and interpretation on this report are based on the  following declared medications.  Unexpected results may arise from  inaccuracies in the declared medications.  **Note: The testing scope of this panel includes these medications:  Gabapentin (Neurontin)  Methocarbamol (Robaxin)  Metoprolol (Lopressor)  Mirtazapine (Remeron)  Oxycodone (Percocet)  Tramadol (Ultram)  **Note: The testing scope of this panel does not include small to  moderate amounts of these reported medications:  Acetaminophen (Percocet)  Acetaminophen (Tylenol)  Tizanidine (Zanaflex)  **Note: The testing scope of this panel does not include following  reported medications:  Atorvastatin (Lipitor)  Ciprofloxacin (Cipro)  Furosemide (Lasix)  Levothyroxine  Methylprednisolone (Medrol)  Metronidazole (Flagyl)  Nicotine (Nicoderm)  Omeprazole (Prilosec)  Ranitidine (Zantac) ==================================================================== For clinical consultation, please call [phone]. ====================================================================    UDS interpretation: Compliant          Medication Assessment Form:  Reviewed. Patient indicates being compliant with therapy Treatment compliance: Compliant Risk Assessment Profile: Aberrant behavior: See prior evaluations. None observed or detected today Comorbid factors increasing risk of overdose: See prior notes. No additional risks detected today Opioid risk tool (ORT) (Total Score): 1 Personal History of Substance Abuse (SUD-Substance use disorder):  Alcohol: Negative  Illegal Drugs: Negative  Rx Drugs: Negative  ORT Risk Level calculation: Low Risk Risk of substance use disorder (SUD): Low Opioid Risk Tool - 02/23/18 1051      Family History of Substance Abuse   Alcohol  Negative    Illegal Drugs  Negative    Rx Drugs  Negative      Personal History of Substance Abuse   Alcohol  Negative    Illegal Drugs  Negative    Rx Drugs  Negative      Age   Age between 58-45 years   No      History of Preadolescent Sexual Abuse   History of Preadolescent Sexual Abuse  Negative or Male      Psychological Disease   Psychological Disease  Negative    Depression  Positive      Total Score   Opioid Risk Tool Scoring  1    Opioid Risk Interpretation  Low Risk      ORT Scoring interpretation table:  Score <3 = Low Risk for SUD  Score between 4-7 = Moderate Risk for SUD  Score >8 = High Risk for Opioid Abuse   Risk Mitigation Strategies:  Patient Counseling: Covered Patient-Prescriber Agreement (PPA): Present and active  Notification to other healthcare providers: Done  Pharmacologic Plan: No change in therapy, at this time.             Laboratory Chemistry  Inflammation Markers (CRP: Acute Phase) (ESR: Chronic Phase) Lab Results  Component Value Date   ESRSEDRATE 27 (H) 09/07/2012   LATICACIDVEN 0.9 06/19/2015                         Rheumatology Markers Lab Results  Component Value Date   LABURIC 5.2 12/06/2017  Renal Function Markers Lab Results  Component Value Date   BUN 19 10/20/2016   CREATININE  1.03 10/20/2016   GFRAA >60 10/20/2016   GFRNONAA >60 10/20/2016                             Hepatic Function Markers Lab Results  Component Value Date   AST 17 10/19/2016   ALT 20 10/19/2016   ALBUMIN 4.3 10/19/2016   ALKPHOS 75 10/19/2016                        Electrolytes Lab Results  Component Value Date   NA 134 (L) 10/20/2016   K 4.2 10/20/2016   CL 108 10/20/2016   CALCIUM 8.5 (L) 10/20/2016                        Neuropathy Markers No results found for: VITAMINB12, FOLATE, HGBA1C, HIV                      CNS Tests No results found for: COLORCSF, APPEARCSF, RBCCOUNTCSF, WBCCSF, POLYSCSF, LYMPHSCSF, EOSCSF, PROTEINCSF, GLUCCSF, JCVIRUS, CSFOLI, IGGCSF                      Bone Pathology Markers No results found for: VD25OH, [id number], [id number], [id number], 25OHVITD1, 25OHVITD2, 25OHVITD3, TESTOFREE, TESTOSTERONE                       Coagulation Parameters Lab Results  Component Value Date   INR 0.91 10/19/2016   LABPROT 12.2 10/19/2016   APTT 33 05/22/2015   PLT 225 10/21/2016                        Cardiovascular Markers Lab Results  Component Value Date   BNP 240 (H) 11/26/2012   CKTOTAL 107 11/27/2012   CKMB 11.6 (H) 11/27/2012   TROPONINI <0.03 10/20/2016   HGB 14.0 10/21/2016   HCT 41.2 10/21/2016                         CA Markers No results found for: CEA, CA125, LABCA2                      Note: Lab results reviewed.  Recent Diagnostic Imaging Results  DG C-Arm 1-60 Min-No Report Fluoroscopy was utilized by the requesting physician.  No radiographic  interpretation.   Complexity Note: Imaging results reviewed. Results shared with Mr. Lumpkin, using Layman's terms.                         Meds   Current Outpatient Medications:  .  aspirin 81 MG tablet, Take 81 mg by mouth daily., Disp: , Rfl:  .  atorvastatin (LIPITOR) 40 MG tablet, Take 40 mg by mouth daily., Disp: , Rfl:  .  gabapentin (NEURONTIN) 300 MG capsule, Take 300 mg  by mouth at bedtime. , Disp: , Rfl:  .  HYDROcodone-acetaminophen (NORCO) 10-325 MG tablet, Take 1 tablet by mouth 2 (two) times daily as needed for severe pain., Disp: 60 tablet, Rfl: 0 .  levothyroxine (SYNTHROID, LEVOTHROID) 150 MCG tablet, Take 150 mcg by mouth daily before breakfast., Disp: , Rfl:  .  metoprolol tartrate (LOPRESSOR) 25 MG tablet, Take 12.5 mg by mouth  2 (two) times daily., Disp: , Rfl:  .  mirtazapine (REMERON) 30 MG tablet, Take 30 mg by mouth at bedtime., Disp: , Rfl:  .  tiZANidine (ZANAFLEX) 4 MG tablet, Take 1 tablet (4 mg total) by mouth 2 (two) times daily as needed for muscle spasms., Disp: 60 tablet, Rfl: 2 .  furosemide (LASIX) 20 MG tablet, Take 20 mg by mouth 2 (two) times daily., Disp: , Rfl:  .  [START ON 03/25/2018] HYDROcodone-acetaminophen (NORCO) 10-325 MG tablet, Take 1 tablet by mouth 2 (two) times daily as needed for severe pain., Disp: 60 tablet, Rfl: 0 .  omeprazole (PRILOSEC) 20 MG capsule, Take 20 mg by mouth daily., Disp: , Rfl:  .  ranitidine (ZANTAC) 150 MG tablet, Take by mouth., Disp: , Rfl:   ROS  Constitutional: Denies any fever or chills Gastrointestinal: No reported hemesis, hematochezia, vomiting, or acute GI distress Musculoskeletal: Denies any acute onset joint swelling, redness, loss of ROM, or weakness Neurological: No reported episodes of acute onset apraxia, aphasia, dysarthria, agnosia, amnesia, paralysis, loss of coordination, or loss of consciousness  Allergies  Mr. Dack is allergic to morphine and related.  [place]  Drug: Mr. Sahakian  reports no history of drug use. Alcohol:  reports no history of alcohol use. Tobacco:  reports that he has been smoking. He has a 35.00 pack-year smoking history. He has never used smokeless tobacco. Medical:  has a past medical history of Atrial fibrillation ([place]), Back pain, CHF (congestive heart failure) ([place]), COPD (chronic obstructive pulmonary disease) ([place]), Hyperlipidemia, Myocardial  infarction ([place]), S/P CABG (coronary artery bypass graft), and Thyroid disease. Surgical: Mr. Mcbroom  has a past surgical history that includes Coronary angioplasty; Cardiac catheterization (N/A, 07/24/2014); Coronary angioplasty with stent; Coronary artery bypass graft; Joint replacement (2010); Joint replacement (2016); Colonoscopy with propofol (N/A, 12/30/2016); and Shoulder surgery ([place]). Family: family history includes Heart disease in his father and mother.  Constitutional Exam  General appearance: Well nourished, well developed, and well hydrated. In no apparent acute distress Vitals:   02/23/18 1058  BP: 108/80  Pulse: 72  Resp: 18  Temp: 98.3 F (36.8 C)  TempSrc: Oral  SpO2: 96%  Weight: 228 lb (103.4 kg)  Height: 6' (1.829 m)   BMI Assessment: Estimated body mass index is 30.92 kg/m as calculated from the following:   Height as of this encounter: 6' (1.829 m).   Weight as of this encounter: 228 lb (103.4 kg).  BMI interpretation table: BMI level Category Range association with higher incidence of chronic pain  <18 kg/m2 Underweight   18.5-24.9 kg/m2 Ideal body weight   25-29.9 kg/m2 Overweight Increased incidence by 20%  30-34.9 kg/m2 Obese (Class I) Increased incidence by 68%  35-39.9 kg/m2 Severe obesity (Class II) Increased incidence by 136%  >40 kg/m2 Extreme obesity (Class III) Increased incidence by 254%   Patient's current BMI Ideal Body weight  Body mass index is 30.92 kg/m. Ideal body weight: 77.6 kg (171 lb 1.2 oz) Adjusted ideal body weight: 87.9 kg (193 lb 13.5 oz)   BMI Readings from Last 4 Encounters:  02/23/18 30.92 kg/m  12/26/17 28.62 kg/m  11/21/17 28.48 kg/m  10/27/17 28.48 kg/m   Wt Readings from Last 4 Encounters:  02/23/18 228 lb (103.4 kg)  12/26/17 211 lb (95.7 kg)  11/21/17 210 lb (95.3 kg)  10/27/17 210 lb (95.3 kg)  Psych/Mental status: Alert, oriented x 3 (person, place, & time)       Eyes: PERLA Respiratory: No evidence  of acute respiratory distress Cervical Spine Area Exam  Skin & Axial Inspection: No masses, redness, edema, swelling, or associated skin lesions Alignment: Symmetrical Functional ROM: Decreased ROM, bilaterally Stability: No instability detected Muscle Tone/Strength: Functionally intact. No obvious neuro-muscular anomalies detected. Sensory (Neurological): Musculoskeletal pain pattern Palpation: Complains of area being tender to palpation Positive provocative maneuver for for cervical facet disease        Upper Extremity (UE) Exam    Side: Right upper extremity  Side: Left upper extremity   Skin & Extremity Inspection: Skin color, temperature, and hair growth are WNL. No peripheral edema or cyanosis. No masses, redness, swelling, asymmetry, or associated skin lesions. No contractures.  Skin & Extremity Inspection: Skin color, temperature, and hair growth are WNL. No peripheral edema or cyanosis. No masses, redness, swelling, asymmetry, or associated skin lesions. No contractures.   Functional ROM: Unrestricted ROM          Functional ROM: Unrestricted ROM           Muscle Tone/Strength: Functionally intact. No obvious neuro-muscular anomalies detected.  Muscle Tone/Strength: Functionally intact. No obvious neuro-muscular anomalies detected.   Sensory (Neurological): Unimpaired          Sensory (Neurological): Unimpaired           Palpation: No palpable anomalies              Palpation: No palpable anomalies               Provocative Test(s):  Phalen's test: deferred Tinel's test: deferred Apley's scratch test (touch opposite shoulder):  Action 1 (Across chest): deferred Action 2 (Overhead): deferred Action 3 (LB reach): deferred   Provocative Test(s):  Phalen's test: deferred Tinel's test: deferred Apley's scratch test (touch opposite shoulder):  Action 1 (Across chest): deferred Action 2 (Overhead): deferred Action 3 (LB reach): deferred     Thoracic Spine Area Exam   Skin & Axial Inspection: No masses, redness, or swelling Alignment: Symmetrical Functional ROM: Unrestricted ROM Stability: No instability detected Muscle Tone/Strength: Functionally intact. No obvious neuro-muscular anomalies detected. Sensory (Neurological): Unimpaired Muscle strength & Tone: No palpable anomalies  Lumbar Spine Area Exam  Skin & Axial Inspection: No masses, redness, or swelling Alignment: Symmetrical Functional ROM: Unrestricted ROM       Stability: No instability detected Muscle Tone/Strength: Functionally intact. No obvious neuro-muscular anomalies detected. Sensory (Neurological): Unimpaired Palpation: No palpable anomalies       Provocative Tests: Hyperextension/rotation test: deferred today       Lumbar quadrant test (Kemp's test): deferred today       Lateral bending test: deferred today       Patrick's Maneuver: deferred today                   FABER test: deferred today                   S-I anterior distraction/compression test: deferred today         S-I lateral compression test: deferred today         S-I Thigh-thrust test: deferred today         S-I Gaenslen's test: deferred today          Gait & Posture Assessment  Ambulation: Unassisted Gait: Relatively normal for age and body habitus Posture: WNL   Lower Extremity Exam    Side: Right lower extremity  Side: Left lower extremity  Stability: No instability observed  Stability: No instability observed          Skin & Extremity Inspection: Skin color, temperature, and hair growth are WNL. No peripheral edema or cyanosis. No masses, redness, swelling, asymmetry, or associated skin lesions. No contractures.  Skin & Extremity Inspection: Skin color, temperature, and hair growth are WNL. No peripheral edema or cyanosis. No masses, redness, swelling, asymmetry, or associated skin lesions. No contractures.  Functional ROM: Unrestricted ROM                  Functional ROM: Unrestricted ROM                   Muscle Tone/Strength: Functionally intact. No obvious neuro-muscular anomalies detected.  Muscle Tone/Strength: Functionally intact. No obvious neuro-muscular anomalies detected.  Sensory (Neurological): Unimpaired  Sensory (Neurological): Unimpaired  Palpation: No palpable anomalies  Palpation: No palpable anomalies     Assessment  Primary Diagnosis & Pertinent Problem List: The primary encounter diagnosis was Myofascial pain. Diagnoses of Spondylosis of cervical region without myelopathy or radiculopathy, Chronic pain syndrome, DDD (degenerative disc disease), cervical, Cervical facet joint syndrome, and Lumbar facet arthropathy were also pertinent to this visit.  Status Diagnosis  Having a Flare-up Controlled Controlled 1. Myofascial pain   2. Spondylosis of cervical region without myelopathy or radiculopathy   3. Chronic pain syndrome   4. DDD (degenerative disc disease), cervical   5. Cervical facet joint syndrome   6. Lumbar facet arthropathy      Follows up for medication management.  Refill of hydrocodone as below.  Prescription for 2 months.  Continue Zanaflex as prescribed.  Avoid NSAIDs given history of CABG.  Schedule for trigger point injections for myofascial pain syndrome and cervical and thoracic region.  Plan of Care  Pharmacotherapy (Medications Ordered): Meds ordered this encounter  Medications  . HYDROcodone-acetaminophen (NORCO) 10-325 MG tablet    Sig: Take 1 tablet by mouth 2 (two) times daily as needed for severe pain.    Dispense:  60 tablet    Refill:  0    Do not place this medication, or any other prescription from our practice, on "Automatic Refill". Patient may have prescription filled one day early if pharmacy is closed on scheduled refill date.  Marland Kitchen HYDROcodone-acetaminophen (NORCO) 10-325 MG tablet    Sig: Take 1 tablet by mouth 2 (two) times daily as needed for severe pain.    Dispense:  60 tablet    Refill:  0    Do not place  this medication, or any other prescription from our practice, on "Automatic Refill". Patient may have prescription filled one day early if pharmacy is closed on scheduled refill date.   Lab-work, procedure(s), and/or referral(s): Orders Placed This Encounter  Procedures  . TRIGGER POINT INJECTION    Provider-requested follow-up: Return in about 4 weeks (around 03/23/2018) for Procedure.  Primary Care Physician: Letta Median, MD Location[facility] Note by: Gillis Santa, M.D Date: 02/23/2018; Time: 11:25 AM  There are no Patient Instructions on file for this visit.

## 2018-03-22 ENCOUNTER — Ambulatory Visit
Payer: Medicare Other | Attending: Student in an Organized Health Care Education/Training Program | Admitting: Student in an Organized Health Care Education/Training Program

## 2018-03-22 ENCOUNTER — Encounter: Payer: Self-pay | Admitting: Student in an Organized Health Care Education/Training Program

## 2018-03-22 VITALS — BP 119/75 | HR 63 | Temp 98.1°F | Resp 16 | Ht 72.0 in | Wt 224.0 lb

## 2018-03-22 DIAGNOSIS — M7918 Myalgia, other site: Secondary | ICD-10-CM

## 2018-03-22 MED ORDER — ROPIVACAINE HCL 2 MG/ML IJ SOLN
10.0000 mL | Freq: Once | INTRAMUSCULAR | Status: AC
  Administered 2018-03-22: 10 mL

## 2018-03-22 MED ORDER — ROPIVACAINE HCL 2 MG/ML IJ SOLN
INTRAMUSCULAR | Status: AC
  Filled 2018-03-22: qty 10

## 2018-03-22 NOTE — Patient Instructions (Signed)

## 2018-03-22 NOTE — Progress Notes (Signed)
Safety precautions to be maintained throughout the outpatient stay will include: orient to surroundings, keep bed in low position, maintain call bell within reach at all times, provide assistance with transfer out of bed and ambulation.  

## 2018-03-22 NOTE — Progress Notes (Signed)
Patient's Name: Bryce Haney  MRN: [id number]  Referring Provider: Letta Median, MD  DOB: [birth year]  PCP: Letta Median, MD  DOS: 03/22/2018  Note by: Gillis Santa, MD  Service setting: Ambulatory outpatient  Specialty: Interventional Pain Management  Patient type: Established  Location: [facility] (AMB) Pain Management Facility  Visit type: Interventional Procedure   Primary Reason for Visit: Interventional Pain Management Treatment. CC: Neck Pain (bilateral) and Back Pain (upper, between shoulder blades )  Procedure:          Anesthesia, Analgesia, Anxiolysis:  Type: Trigger Point Injection (3+muscle groups)          CPT: 20553 Primary Purpose: Diagnostic Region: Posterior Cervical Level: Cervical Target Area: Trigger Point Approach: Percutaneous, ipsilateral approach. Laterality: Midline        Type: Local Anesthesia Indication(s): Analgesia         Route: Infiltration ([place]/IM) IV Access: Declined Sedation: Declined   Position: Sitting   Indications: 1. Myofascial pain    Pain Score: Pre-procedure: 3 /10 Post-procedure: 0-No pain/10  Pre-op Assessment:  Bryce Haney is a 68 y.o. (year old), male patient, seen today for interventional treatment. He  has a past surgical history that includes Coronary angioplasty; Cardiac catheterization (N/A, 07/24/2014); Coronary angioplasty with stent; Coronary artery bypass graft; Joint replacement (2010); Joint replacement (2016); Colonoscopy with propofol (N/A, 12/30/2016); and Shoulder surgery (Right). Bryce Haney has a current medication list which includes the following prescription(s): aspirin, atorvastatin, furosemide, gabapentin, hydrocodone-acetaminophen, hydrocodone-acetaminophen, levothyroxine, metoprolol tartrate, mirtazapine, omeprazole, ranitidine, and tizanidine. His primarily concern today is the Neck Pain (bilateral) and Back Pain (upper, between shoulder blades )  Initial Vital Signs:  Pulse/HCG Rate: (!) 58  Temp:  98.1 F (36.7 C) Resp: 16 BP: 101/66 SpO2: 99 %  BMI: Estimated body mass index is 30.38 kg/m as calculated from the following:   Height as of this encounter: 6' (1.829 m).   Weight as of this encounter: 224 lb (101.6 kg).  Risk Assessment: Allergies: Reviewed. He is allergic to morphine and related.  Allergy Precautions: None required Coagulopathies: Reviewed. None identified.  Blood-thinner therapy: None at this time Active Infection(s): Reviewed. None identified. Bryce Haney is afebrile  Site Confirmation: Bryce Haney was asked to confirm the procedure and laterality before marking the site Procedure checklist: Completed Consent: Before the procedure and under the influence of no sedative(s), amnesic(s), or anxiolytics, the patient was informed of the treatment options, risks and possible complications. To fulfill our ethical and legal obligations, as recommended by the American Medical Association's Code of Ethics, I have informed the patient of my clinical impression; the nature and purpose of the treatment or procedure; the risks, benefits, and possible complications of the intervention; the alternatives, including doing nothing; the risk(s) and benefit(s) of the alternative treatment(s) or procedure(s); and the risk(s) and benefit(s) of doing nothing. The patient was provided information about the general risks and possible complications associated with the procedure. These may include, but are not limited to: failure to achieve desired goals, infection, bleeding, organ or nerve damage, allergic reactions, paralysis, and death. In addition, the patient was informed of those risks and complications associated to the procedure, such as failure to decrease pain; infection; bleeding; organ or nerve damage with subsequent damage to sensory, motor, and/or autonomic systems, resulting in permanent pain, numbness, and/or weakness of one or several areas of the body; allergic reactions; (i.e.:  anaphylactic reaction); and/or death. Furthermore, the patient was informed of those risks and complications associated with the medications.  These include, but are not limited to: allergic reactions (i.e.: anaphylactic or anaphylactoid reaction(s)); adrenal axis suppression; blood sugar elevation that in diabetics may result in ketoacidosis or comma; water retention that in patients with history of congestive heart failure may result in shortness of breath, pulmonary edema, and decompensation with resultant heart failure; weight gain; swelling or edema; medication-induced neural toxicity; particulate matter embolism and blood vessel occlusion with resultant organ, and/or nervous system infarction; and/or aseptic necrosis of one or more joints. Finally, the patient was informed that Medicine is not an exact science; therefore, there is also the possibility of unforeseen or unpredictable risks and/or possible complications that may result in a catastrophic outcome. The patient indicated having understood very clearly. We have given the patient no guarantees and we have made no promises. Enough time was given to the patient to ask questions, all of which were answered to the patient's satisfaction. Bryce Haney has indicated that he wanted to continue with the procedure. Attestation: I, the ordering provider, attest that I have discussed with the patient the benefits, risks, side-effects, alternatives, likelihood of achieving goals, and potential problems during recovery for the procedure that I have provided informed consent. Date  Time: 03/22/2018 10:17 AM  Pre-Procedure Preparation:  Monitoring: As per clinic protocol. Respiration, ETCO2, SpO2, BP, heart rate and rhythm monitor placed and checked for adequate function Safety Precautions: Patient was assessed for positional comfort and pressure points before starting the procedure. Time-out: I initiated and conducted the "Time-out" before starting the  procedure, as per protocol. The patient was asked to participate by confirming the accuracy of the "Time Out" information. Verification of the correct person, site, and procedure were performed and confirmed by me, the nursing staff, and the patient. "Time-out" conducted as per Joint Commission's Universal Protocol (UP.01.01.01). Time: 1044  Description of Procedure:          Area Prepped: Entire Posterior Cervical Region Prepping solution: ChloraPrep (2% chlorhexidine gluconate and 70% isopropyl alcohol) Safety Precautions: Aspiration looking for blood return was conducted prior to all injections. At no point did we inject any substances, as a needle was being advanced. No attempts were made at seeking any paresthesias. Safe injection practices and needle disposal techniques used. Medications properly checked for expiration dates. SDV (single dose vial) medications used. Description of the Procedure: Protocol guidelines were followed. The patient was placed in position over the fluoroscopy table. The target area was identified and the area prepped in the usual manner. Skin & deeper tissues infiltrated with local anesthetic. Appropriate amount of time allowed to pass for local anesthetics to take effect. The procedure needles were then advanced to the target area. Proper needle placement secured. Negative aspiration confirmed. Solution injected in intermittent fashion, asking for systemic symptoms every 0.5cc of injectate. The needles were then removed and the area cleansed, making sure to leave some of the prepping solution back to take advantage of its long term bactericidal properties.  Vitals:   03/22/18 1024 03/22/18 1049  BP: 101/66 119/75  Pulse: (!) 58 63  Resp: 16 16  Temp: 98.1 F (36.7 C)   TempSrc: Oral   SpO2: 99% 97%  Weight: 224 lb (101.6 kg)   Height: 6' (1.829 m)     Start Time: 1044 hrs. End Time: 1047 hrs. Materials:  Needle(s) Type: Epidural needle Gauge: 25G Length:  1.5-in Medication(s): Please see orders for medications and dosing details. Approximately 10 trigger point injected in the cervical and trapezius region with 1 cc of 0.2%  ropivacaine.  Dry needling also performed. Imaging Guidance:          Type of Imaging Technique: None used Indication(s): N/A Exposure Time: No patient exposure Contrast: None used. Fluoroscopic Guidance: N/A Ultrasound Guidance: N/A Interpretation: N/A  Antibiotic Prophylaxis:   Anti-infectives (From admission, onward)   None     Indication(s): None identified  Post-operative Assessment:  Post-procedure Vital Signs:  Pulse/HCG Rate: 63  Temp: 98.1 F (36.7 C) Resp: 16 BP: 119/75 SpO2: 97 %  EBL: None  Complications: No immediate post-treatment complications observed by team, or reported by patient.  Note: The patient tolerated the entire procedure well. A repeat set of vitals were taken after the procedure and the patient was kept under observation following institutional policy, for this type of procedure. Post-procedural neurological assessment was performed, showing return to baseline, prior to discharge. The patient was provided with post-procedure discharge instructions, including a section on how to identify potential problems. Should any problems arise concerning this procedure, the patient was given instructions to immediately contact us, at any time, without hesitation. In any case, we plan to contact the patient by telephone for a follow-up status report regarding this interventional procedure.  Comments:  No additional relevant information.  Plan of Care   Imaging Orders  No imaging studies ordered today   Procedure Orders    No procedure(s) ordered today    Medications ordered for procedure: Meds ordered this encounter  Medications  . ropivacaine (PF) 2 mg/mL (0.2%) (NAROPIN) injection 10 mL   Medications administered: We administered ropivacaine (PF) 2 mg/mL (0.2%).  See the medical  record for exact dosing, route, and time of administration.  Disposition: Discharge home  Discharge Date & Time: 03/22/2018; 1055 hrs.   Future Appointments  Date Time Provider [place]  04/20/2018 10:45 AM Gillis Santa, MD [facility] None   Primary Care Physician: Letta Median, MD Location: [facility] Out[facility] Management Facility Note by: Gillis Santa, MD Date: 03/22/2018; Time: 11:07 AM  Disclaimer:  Medicine is not an exact science. The only guarantee in medicine is that nothing is guaranteed. It is important to note that the decision to proceed with this intervention was based on the information collected from the patient. The Data and conclusions were drawn from the patient's questionnaire, the interview, and the physical examination. Because the information was provided in large part by the patient, it cannot be guaranteed that it has not been purposely or unconsciously manipulated. Every effort has been made to obtain as much relevant data as possible for this evaluation. It is important to note that the conclusions that lead to this procedure are derived in large part from the available data. Always take into account that the treatment will also be dependent on availability of resources and existing treatment guidelines, considered by other Pain Management Practitioners as being common knowledge and practice, at the time of the intervention. For Medico-Legal purposes, it is also important to point out that variation in procedural techniques and pharmacological choices are the acceptable norm. The indications, contraindications, technique, and results of the above procedure should only be interpreted and judged by a Board-Certified Interventional Pain Specialist with extensive familiarity and expertise in the same exact procedure and technique.

## 2018-03-23 ENCOUNTER — Telehealth: Payer: Self-pay | Admitting: *Deleted

## 2018-03-23 NOTE — Telephone Encounter (Signed)
Attempted post procedure call, no answer and no voicemail capability

## 2018-04-05 ENCOUNTER — Telehealth: Payer: Self-pay | Admitting: Student in an Organized Health Care Education/Training Program

## 2018-04-05 MED ORDER — HYDROCODONE-ACETAMINOPHEN 10-325 MG PO TABS: 1.0000 | ORAL_TABLET | Freq: Two times a day (BID) | ORAL | 0 refills | Status: DC | PRN

## 2018-04-05 NOTE — Telephone Encounter (Signed)
This has been taken care of. New script called in per Dr. Holley Raring.

## 2018-04-05 NOTE — Telephone Encounter (Signed)
I'm working on this. 

## 2018-04-05 NOTE — Telephone Encounter (Signed)
Patient called stating he has only filled one of his scripts sent in to pharmacy in December. HIs bottle is saying no refills. Please call and find out what happen to his script to be filled on 03-25-18.  Call patient and let him know status

## 2018-04-20 ENCOUNTER — Encounter: Payer: Self-pay | Admitting: Student in an Organized Health Care Education/Training Program

## 2018-04-20 ENCOUNTER — Other Ambulatory Visit: Payer: Self-pay

## 2018-04-20 ENCOUNTER — Ambulatory Visit
Payer: Medicare Other | Attending: Student in an Organized Health Care Education/Training Program | Admitting: Student in an Organized Health Care Education/Training Program

## 2018-04-20 VITALS — BP 103/78 | HR 79 | Temp 97.9°F | Resp 18 | Ht 72.0 in | Wt 224.0 lb

## 2018-04-20 DIAGNOSIS — G8929 Other chronic pain: Secondary | ICD-10-CM | POA: Diagnosis present

## 2018-04-20 DIAGNOSIS — Z951 Presence of aortocoronary bypass graft: Secondary | ICD-10-CM | POA: Insufficient documentation

## 2018-04-20 DIAGNOSIS — M545 Low back pain: Secondary | ICD-10-CM | POA: Diagnosis present

## 2018-04-20 DIAGNOSIS — M47816 Spondylosis without myelopathy or radiculopathy, lumbar region: Secondary | ICD-10-CM | POA: Insufficient documentation

## 2018-04-20 DIAGNOSIS — M47812 Spondylosis without myelopathy or radiculopathy, cervical region: Secondary | ICD-10-CM | POA: Diagnosis present

## 2018-04-20 DIAGNOSIS — G894 Chronic pain syndrome: Secondary | ICD-10-CM | POA: Insufficient documentation

## 2018-04-20 DIAGNOSIS — M5030 Other cervical disc degeneration, unspecified cervical region: Secondary | ICD-10-CM | POA: Diagnosis present

## 2018-04-20 MED ORDER — HYDROCODONE-ACETAMINOPHEN 10-325 MG PO TABS: 1.0000 | ORAL_TABLET | Freq: Two times a day (BID) | ORAL | 0 refills | Status: AC | PRN

## 2018-04-20 NOTE — Progress Notes (Signed)
Patient's Name: Bryce Haney  MRN: [id number]  Referring Provider: Letta Median, MD  DOB: 04/30/1950  PCP: Letta Median, MD  DOS: 04/20/2018  Note by: Gillis Santa, MD  Service setting: Ambulatory outpatient  Specialty: Interventional Pain Management  Location: [facility] (AMB) Pain Management Facility    Patient type: Established   Primary Reason(s) for Visit: Encounter for prescription drug management & post-procedure evaluation of chronic illness with mild to moderate exacerbation(Level of risk: moderate) CC: Neck Pain and Shoulder Pain (bilateral)  HPI  Bryce Haney is a 68 y.o. year old, male patient, who comes today for a post-procedure evaluation and medication management. He has Pneumonia; Lower GI bleed; Leukocytosis; Diverticulitis large intestine; Acute renal insufficiency; Hyponatremia; CAD (coronary artery disease); Chronic depression; Coronary artery disease involving autologous artery coronary bypass graft; GERD (gastroesophageal reflux disease); Hypothyroidism, unspecified; Mixed hyperlipidemia; Paroxysmal A-fib ([place]); Phlebitis and thrombophlebitis of superficial vessels of lower extremities, bilateral; S/P CABG x 2; STEMI (ST elevation myocardial infarction) ([place]); Special screening for malignant neoplasms, colon; Spondylosis of cervical region without myelopathy or radiculopathy; DDD (degenerative disc disease), cervical; Cervical facet joint syndrome; and Chronic pain syndrome on their problem list. His primarily concern today is the Neck Pain and Shoulder Pain (bilateral)  Pain Assessment: Location:   Neck(also shoulders and mid upper back pain at times ) Radiating: into shoulders and "when really bad, goes into upper arms" Onset: More than a month ago Duration: Chronic pain Quality: Constant, Sharp Severity: 3 /10 (subjective, self-reported pain score)  Note: Reported level is compatible with observation.                         When using our objective Pain Scale,  levels between 6 and 10/10 are said to belong in an emergency room, as it progressively worsens from a 6/10, described as severely limiting, requiring emergency care not usually available at an outpatient pain management facility. At a 6/10 level, communication becomes difficult and requires great effort. Assistance to reach the emergency department may be required. Facial flushing and profuse sweating along with potentially dangerous increases in heart rate and blood pressure will be evident. Effect on ADL: when pt turns head "the wrong way", pain is severe Timing: Constant Modifying factors: meds BP: 103/78  HR: 79  Bryce Haney was last seen on 04/05/2018 for a procedure. During today's appointment we reviewed Bryce Haney post-procedure results, as well as his outpatient medication regimen.  Further details on both, my assessment(s), as well as the proposed treatment plan, please see below.  Controlled Substance Pharmacotherapy Assessment REMS (Risk Evaluation and Mitigation Strategy)   04/05/2018  1   04/05/2018  Hydrocodone-Acetamin 10-325 MG  60.00 30 Un Pha   [id number]   Cha ([id number])   0  20.00 MME  Comm Ins       Rise Patience, RN  04/20/2018 10:53 AM  Sign when Signing Visit Nursing Pain Medication Assessment:  Safety precautions to be maintained throughout the outpatient stay will include: orient to surroundings, keep bed in low position, maintain call bell within reach at all times, provide assistance with transfer out of bed and ambulation.  Medication Inspection Compliance: Bryce Haney did not comply with our request to bring his pills to be counted. He was reminded that bringing the medication bottles, even when empty, is a requirement.  Medication: None brought in. Pill/Patch Count: None available to be counted. Bottle Appearance: No container available. Did not bring bottle(s) to appointment. Filled  Date: N/A Last Medication intake:  04/16/2018    Pharmacokinetics: Liberation and absorption (onset of action): WNL Distribution (time to peak effect): WNL Metabolism and excretion (duration of action): WNL         Pharmacodynamics: Desired effects: Analgesia: Bryce Haney reports >50% benefit. Functional ability: Patient reports that medication allows him to accomplish basic ADLs Clinically meaningful improvement in function (CMIF): Sustained CMIF goals met Perceived effectiveness: Described as relatively effective, allowing for increase in activities of daily living (ADL) Undesirable effects: Side-effects or Adverse reactions: None reported Monitoring: [place] PMP: Online review of the past 36-monthperiod conducted. Compliant with practice rules and regulations Last UDS on record: Summary  Date Value Ref Range Status  10/12/2017 FINAL  Final    Comment:    ==================================================================== TOXASSURE COMP DRUG ANALYSIS,UR ==================================================================== Test                             Result       Flag       Units Drug Present and Declared for Prescription Verification   Tramadol                       136          EXPECTED   ng/mg creat   O-Desmethyltramadol            68           EXPECTED   ng/mg creat   N-Desmethyltramadol            112          EXPECTED   ng/mg creat    Source of tramadol is a prescription medication.    O-desmethyltramadol and N-desmethyltramadol are expected    metabolites of tramadol.   Gabapentin                     PRESENT      EXPECTED   Mirtazapine                    PRESENT      EXPECTED   Acetaminophen                  PRESENT      EXPECTED   Metoprolol                     PRESENT      EXPECTED Drug Present not Declared for Prescription Verification   Carboxy-THC                    140          UNEXPECTED ng/mg creat    Carboxy-THC is a metabolite of tetrahydrocannabinol  (THC).    Source of [facility] most commonly illicit, but  THC is also present    in a scheduled prescription medication. Drug Absent but Declared for Prescription Verification   Oxycodone                      Not Detected UNEXPECTED ng/mg creat   Tizanidine                     Not Detected UNEXPECTED    Tizanidine, as indicated in the declared medication list, is not    always detected even when used as directed.   Methocarbamol  Not Detected UNEXPECTED ==================================================================== Test                      Result    Flag   Units      Ref Range   Creatinine              168              mg/dL      >=20 ==================================================================== Declared Medications:  The flagging and interpretation on this report are based on the  following declared medications.  Unexpected results may arise from  inaccuracies in the declared medications.  **Note: The testing scope of this panel includes these medications:  Gabapentin (Neurontin)  Methocarbamol (Robaxin)  Metoprolol (Lopressor)  Mirtazapine (Remeron)  Oxycodone (Percocet)  Tramadol (Ultram)  **Note: The testing scope of this panel does not include small to  moderate amounts of these reported medications:  Acetaminophen (Percocet)  Acetaminophen (Tylenol)  Tizanidine (Zanaflex)  **Note: The testing scope of this panel does not include following  reported medications:  Atorvastatin (Lipitor)  Ciprofloxacin (Cipro)  Furosemide (Lasix)  Levothyroxine  Methylprednisolone (Medrol)  Metronidazole (Flagyl)  Nicotine (Nicoderm)  Omeprazole (Prilosec)  Ranitidine (Zantac) ==================================================================== For clinical consultation, please call [phone]. ====================================================================    UDS interpretation: Compliant          Medication Assessment Form: Reviewed. Patient indicates being compliant with therapy Treatment compliance:  Compliant Risk Assessment Profile: Aberrant behavior: See initial evaluations. None observed or detected today Comorbid factors increasing risk of overdose: See initial evaluation. No additional risks detected today Opioid risk tool (ORT):  Opioid Risk  04/20/2018  Alcohol 0  Illegal Drugs 0  Rx Drugs 0  Alcohol 0  Illegal Drugs 0  Rx Drugs 0  Age between 16-45 years  0  History of Preadolescent Sexual Abuse 0  Psychological Disease 0  Depression 1  Opioid Risk Tool Scoring 1  Opioid Risk Interpretation Low Risk    ORT Scoring interpretation table:  Score <3 = Low Risk for SUD  Score between 4-7 = Moderate Risk for SUD  Score >8 = High Risk for Opioid Abuse   Risk of substance use disorder (SUD): Low  Risk Mitigation Strategies:  Patient Counseling: Covered Patient-Prescriber Agreement (PPA): Present and active  Notification to other healthcare providers: Done  Pharmacologic Plan: No change in therapy, at this time.             Post-Procedure Assessment  04/05/2018 Procedure: TPI  Influential Factors: BMI: 30.38 kg/m Intra-procedural challenges: None observed.         Assessment challenges: None detected.              Reported side-effects: None.        Post-procedural adverse reactions or complications: None reported         Sedation: Please see nurses note. When no sedatives are used, the analgesic levels obtained are directly associated to the effectiveness of the local anesthetics. However, when sedation is provided, the level of analgesia obtained during the initial 1 hour following the intervention, is believed to be the result of a combination of factors. These factors may include, but are not limited to: 1. The effectiveness of the local anesthetics used. 2. The effects of the analgesic(s) and/or anxiolytic(s) used. 3. The degree of discomfort experienced by the patient at the time of the procedure. 4. The patients ability and reliability in recalling and  recording the events. 5. The presence and influence  of possible secondary gains and/or psychosocial factors. Reported result: Relief experienced during the 1st hour after the procedure: 100 % (Ultra-Short Term Relief)            Interpretative annotation: Clinically appropriate result. Analgesia during this period is likely to be Local Anesthetic and/or IV Sedative (Analgesic/Anxiolytic) related.          Effects of local anesthetic: The analgesic effects attained during this period are directly associated to the localized infiltration of local anesthetics and therefore cary significant diagnostic value as to the etiological location, or anatomical origin, of the pain. Expected duration of relief is directly dependent on the pharmacodynamics of the local anesthetic used. Long-acting (4-6 hours) anesthetics used.  Reported result: Relief during the next 4 to 6 hour after the procedure: 100 % (Short-Term Relief)            Interpretative annotation: Clinically appropriate result. Analgesia during this period is likely to be Local Anesthetic-related.          Long-term benefit: Defined as the period of time past the expected duration of local anesthetics (1 hour for short-acting and 4-6 hours for long-acting). With the possible exception of prolonged sympathetic blockade from the local anesthetics, benefits during this period are typically attributed to, or associated with, other factors such as analgesic sensory neuropraxia, antiinflammatory effects, or beneficial biochemical changes provided by agents other than the local anesthetics.  Reported result: Extended relief following procedure: 100 %(X 3 days, then my "neck went back to how it was") (Long-Term Relief)            Interpretative annotation: Clinically possible results. Good relief. No permanent benefit expected. Inflammation plays a part in the etiology to the pain.          Current benefits: Defined as reported results that persistent at this  point in time.   Analgesia: 0 %            Function: Back to baseline ROM: Back to baseline Interpretative annotation: Recurrence of symptoms. No permanent benefit expected. Results would suggest further treatment needed.          Interpretation: Results would suggest therapy to have a limited impact on the patient's condition.                  Plan:  Please see "Plan of Care" for details.                Laboratory Chemistry  Inflammation Markers (CRP: Acute Phase) (ESR: Chronic Phase) Lab Results  Component Value Date   ESRSEDRATE 27 (H) 09/07/2012   LATICACIDVEN 0.9 06/19/2015                         Rheumatology Markers Lab Results  Component Value Date   LABURIC 5.2 12/06/2017                        Renal Function Markers Lab Results  Component Value Date   BUN 19 10/20/2016   CREATININE 1.03 10/20/2016   GFRAA >60 10/20/2016   GFRNONAA >60 10/20/2016                             Hepatic Function Markers Lab Results  Component Value Date   AST 17 10/19/2016   ALT 20 10/19/2016   ALBUMIN 4.3 10/19/2016   ALKPHOS 75 10/19/2016  Electrolytes Lab Results  Component Value Date   NA 134 (L) 10/20/2016   K 4.2 10/20/2016   CL 108 10/20/2016   CALCIUM 8.5 (L) 10/20/2016                        Neuropathy Markers No results found for: VITAMINB12, FOLATE, HGBA1C, HIV                      CNS Tests No results found for: COLORCSF, APPEARCSF, RBCCOUNTCSF, WBCCSF, POLYSCSF, LYMPHSCSF, EOSCSF, PROTEINCSF, GLUCCSF, JCVIRUS, CSFOLI, IGGCSF                      Bone Pathology Markers No results found for: VD25OH, [id number], HQ4696EX5, MW4132GM0, 25OHVITD1, 25OHVITD2, 25OHVITD3, TESTOFREE, TESTOSTERONE                       Coagulation Parameters Lab Results  Component Value Date   INR 0.91 10/19/2016   LABPROT 12.2 10/19/2016   APTT 33 05/22/2015   PLT 225 10/21/2016                        Cardiovascular Markers Lab Results  Component  Value Date   BNP 240 (H) 11/26/2012   CKTOTAL 107 11/27/2012   CKMB 11.6 (H) 11/27/2012   TROPONINI <0.03 10/20/2016   HGB 14.0 10/21/2016   HCT 41.2 10/21/2016                         CA Markers No results found for: CEA, CA125, LABCA2                      Endocrine Markers No results found for: TSH, FREET4, TESTOFREE, TESTOSTERONE, ESTRADIOL, ESTRADIOLPCT, ESTRADIOLFRE                      Note: Lab results reviewed.  Recent Diagnostic Imaging Results  DG C-Arm 1-60 Min-No Report Fluoroscopy was utilized by the requesting physician.  No radiographic  interpretation.   Complexity Note: Imaging results reviewed. Results shared with Bryce Haney, using Layman's terms.                         Meds   Current Outpatient Medications:  .  aspirin 81 MG tablet, Take 81 mg by mouth daily., Disp: , Rfl:  .  atorvastatin (LIPITOR) 40 MG tablet, Take 40 mg by mouth daily., Disp: , Rfl:  .  gabapentin (NEURONTIN) 300 MG capsule, Take 300 mg by mouth at bedtime. , Disp: , Rfl:  .  [START ON 05/08/2018] HYDROcodone-acetaminophen (NORCO) 10-325 MG tablet, Take 1 tablet by mouth 2 (two) times daily as needed for up to 30 days for severe pain., Disp: 60 tablet, Rfl: 0 .  levothyroxine (SYNTHROID, LEVOTHROID) 150 MCG tablet, Take 150 mcg by mouth daily before breakfast., Disp: , Rfl:  .  metoprolol tartrate (LOPRESSOR) 25 MG tablet, Take 12.5 mg by mouth 2 (two) times daily., Disp: , Rfl:  .  mirtazapine (REMERON) 30 MG tablet, Take 30 mg by mouth at bedtime., Disp: , Rfl:  .  omeprazole (PRILOSEC) 20 MG capsule, Take 20 mg by mouth daily., Disp: , Rfl:  .  ranitidine (ZANTAC) 150 MG tablet, Take by mouth., Disp: , Rfl:  .  tiZANidine (ZANAFLEX) 4 MG tablet, Take 1 tablet (4  mg total) by mouth 2 (two) times daily as needed for muscle spasms., Disp: 60 tablet, Rfl: 2 .  furosemide (LASIX) 20 MG tablet, Take 20 mg by mouth 2 (two) times daily., Disp: , Rfl:  .  [START ON 06/07/2018]  HYDROcodone-acetaminophen (NORCO) 10-325 MG tablet, Take 1 tablet by mouth 2 (two) times daily as needed for up to 30 days for severe pain., Disp: 60 tablet, Rfl: 0  ROS  Constitutional: Denies any fever or chills Gastrointestinal: No reported hemesis, hematochezia, vomiting, or acute GI distress Musculoskeletal: Denies any acute onset joint swelling, redness, loss of ROM, or weakness Neurological: No reported episodes of acute onset apraxia, aphasia, dysarthria, agnosia, amnesia, paralysis, loss of coordination, or loss of consciousness  Allergies  Bryce Haney is allergic to morphine and related.  [place]  Drug: Bryce Haney  reports no history of drug use. Alcohol:  reports no history of alcohol use. Tobacco:  reports that he has been smoking. He has a 35.00 pack-year smoking history. He has never used smokeless tobacco. Medical:  has a past medical history of Atrial fibrillation ([place]), Back pain, CHF (congestive heart failure) ([place]), COPD (chronic obstructive pulmonary disease) ([place]), Hyperlipidemia, Myocardial infarction ([place]), S/P CABG (coronary artery bypass graft), and Thyroid disease. Surgical: Bryce Haney  has a past surgical history that includes Coronary angioplasty; Cardiac catheterization (N/A, 07/24/2014); Coronary angioplasty with stent; Coronary artery bypass graft; Joint replacement (2010); Joint replacement (2016); Colonoscopy with propofol (N/A, 12/30/2016); and Shoulder surgery ([place]). Family: family history includes Heart disease in his father and mother.  Constitutional Exam  General appearance: Well nourished, well developed, and well hydrated. In no apparent acute distress Vitals:   04/20/18 1058  BP: 103/78  Pulse: 79  Resp: 18  Temp: 97.9 F (36.6 C)  TempSrc: Oral  SpO2: 96%  Weight: 224 lb (101.6 kg)  Height: 6' (1.829 m)   BMI Assessment: Estimated body mass index is 30.38 kg/m as calculated from the following:   Height as of this encounter: 6' (1.829  m).   Weight as of this encounter: 224 lb (101.6 kg).  BMI interpretation table: BMI level Category Range association with higher incidence of chronic pain  <18 kg/m2 Underweight   18.5-24.9 kg/m2 Ideal body weight   25-29.9 kg/m2 Overweight Increased incidence by 20%  30-34.9 kg/m2 Obese (Class I) Increased incidence by 68%  35-39.9 kg/m2 Severe obesity (Class II) Increased incidence by 136%  >40 kg/m2 Extreme obesity (Class III) Increased incidence by 254%   Patient's current BMI Ideal Body weight  Body mass index is 30.38 kg/m. Ideal body weight: 77.6 kg (171 lb 1.2 oz) Adjusted ideal body weight: 87.2 kg (192 lb 3.9 oz)   BMI Readings from Last 4 Encounters:  04/20/18 30.38 kg/m  03/22/18 30.38 kg/m  02/23/18 30.92 kg/m  12/26/17 28.62 kg/m   Wt Readings from Last 4 Encounters:  04/20/18 224 lb (101.6 kg)  03/22/18 224 lb (101.6 kg)  02/23/18 228 lb (103.4 kg)  12/26/17 211 lb (95.7 kg)  Psych/Mental status: Alert, oriented x 3 (person, place, & time)       Eyes: PERLA Respiratory: No evidence of acute respiratory distress  Cervical Spine Area Exam  Skin & Axial Inspection:No masses, redness, edema, swelling, or associated skin lesions Alignment:Symmetrical Functional WFU:[id number] ROM, bilaterally Stability:No instability detected Muscle Tone/Strength:Functionally intact. No obvious neuro-muscular anomalies detected. Sensory (Neurological):Musculoskeletal pain pattern Palpation:Complains of area being tender to palpationPositive provocative maneuver forfor cervical facet disease  Upper Extremity (UE) Exam    Side:Right upper extremity  Side:Left upper extremity   Skin & Extremity Inspection:Skin color, temperature, and hair growth are WNL. No peripheral edema or cyanosis. No masses, redness, swelling, asymmetry, or associated skin lesions. No contractures.  Skin & Extremity Inspection:Skin color, temperature, and hair growth are WNL.  No peripheral edema or cyanosis. No masses, redness, swelling, asymmetry, or associated skin lesions. No contractures.   Functional HTD:[id number] ROM  Functional OMB:[id number] ROM   Muscle Tone/Strength:Functionally intact. No obvious neuro-muscular anomalies detected.  Muscle Tone/Strength:Functionally intact. No obvious neuro-muscular anomalies detected.   Sensory (Neurological):Unimpaired  Sensory (Neurological):Unimpaired   Palpation:No palpable anomalies  Palpation:No palpable anomalies   Provocative Test(s): Phalen's test:deferred Tinel's test:deferred Apley's scratch test (touch opposite shoulder): Action 1 (Across chest):deferred Action 2 (Overhead):deferred Action 3 (LB reach):deferred   Provocative Test(s): Phalen's test:deferred Tinel's test:deferred Apley's scratch test (touch opposite shoulder): Action 1 (Across chest):deferred Action 2 (Overhead):deferred Action 3 (LB reach):deferred       Thoracic Spine Area Exam  Skin & Axial Inspection: No masses, redness, or swelling Alignment: Symmetrical Functional ROM: Unrestricted ROM Stability: No instability detected Muscle Tone/Strength: Functionally intact. No obvious neuro-muscular anomalies detected. Sensory (Neurological): Unimpaired Muscle strength & Tone: No palpable anomalies  Lumbar Spine Area Exam  Skin & Axial Inspection: No masses, redness, or swelling Alignment: Symmetrical Functional ROM: Unrestricted ROM       Stability: No instability detected Muscle Tone/Strength: Functionally intact. No obvious neuro-muscular anomalies detected. Sensory (Neurological): Unimpaired Palpation: No palpable anomalies       Provocative Tests: Hyperextension/rotation test: deferred today       Lumbar quadrant test (Kemp's test): deferred today       Lateral bending test: deferred today       Patrick's Maneuver: deferred today                    FABER* test: deferred today                   S-I anterior distraction/compression test: deferred today         S-I lateral compression test: deferred today         S-I Thigh-thrust test: deferred today         S-I Gaenslen's test: deferred today         *(Flexion, ABduction and External Rotation)  Gait & Posture Assessment  Ambulation: Unassisted Gait: Relatively normal for age and body habitus Posture: WNL   Lower Extremity Exam    Side: Right lower extremity  Side: Left lower extremity  Stability: No instability observed          Stability: No instability observed          Skin & Extremity Inspection: Skin color, temperature, and hair growth are WNL. No peripheral edema or cyanosis. No masses, redness, swelling, asymmetry, or associated skin lesions. No contractures.  Skin & Extremity Inspection: Skin color, temperature, and hair growth are WNL. No peripheral edema or cyanosis. No masses, redness, swelling, asymmetry, or associated skin lesions. No contractures.  Functional ROM: Unrestricted ROM                  Functional ROM: Unrestricted ROM                  Muscle Tone/Strength: Functionally intact. No obvious neuro-muscular anomalies detected.  Muscle Tone/Strength: Functionally intact. No obvious neuro-muscular anomalies detected.  Sensory (Neurological): Unimpaired  Sensory (Neurological): Unimpaired        DTR: Patellar: deferred today Achilles: deferred today Plantar: deferred today  DTR: Patellar: deferred today Achilles: deferred today Plantar: deferred today  Palpation: No palpable anomalies  Palpation: No palpable anomalies   Assessment   Status Diagnosis  Controlled Controlled Controlled 1. Spondylosis of cervical region without myelopathy or radiculopathy   2. Chronic pain syndrome   3. DDD (degenerative disc disease), cervical   4. Cervical facet joint syndrome   5. Lumbar facet arthropathy   6. Lumbar spondylosis   7. S/P CABG x 2    8. Chronic bilateral low back pain without sciatica      Follows up for medication management and PP eval. TPIs not very effective beyond 3 days. Here for refill of hydrocodone as below.  Prescription for 2 months.  Continue Zanaflex prn, Gabapentin 300 mg qhs as prescribed.  Avoid NSAIDs given history of CABG.  Plan of Care  Pharmacotherapy (Medications Ordered): Meds ordered this encounter  Medications  . HYDROcodone-acetaminophen (NORCO) 10-325 MG tablet    Sig: Take 1 tablet by mouth 2 (two) times daily as needed for up to 30 days for severe pain.    Dispense:  60 tablet    Refill:  0    Do not place this medication, or any other prescription from our practice, on "Automatic Refill". Patient may have prescription filled one day early if pharmacy is closed on scheduled refill date.  Marland Kitchen HYDROcodone-acetaminophen (NORCO) 10-325 MG tablet    Sig: Take 1 tablet by mouth 2 (two) times daily as needed for up to 30 days for severe pain.    Dispense:  60 tablet    Refill:  0    Do not place this medication, or any other prescription from our practice, on "Automatic Refill". Patient may have prescription filled one day early if pharmacy is closed on scheduled refill date.   Lab-work, procedure(s), and/or referral(s): Orders Placed This Encounter  Procedures  . ToxASSURE Select 13 (MW), Urine   Interventional history: Status post bilateral cervical facet medial branch nerve blocks at C4, C5, C6, C7 without any significant improvement; status post cervical trigger point injections without any significant improvement.  Provider-requested follow-up: Return in about 2 months (around 07/05/2018) for Medication Management.  Future Appointments  Date Time Provider [place]  07/11/2018 11:30 AM Gillis Santa, MD [facility] None    Primary Care Physician: Letta Median, MD Location: [facility] Facility Note by: Gillis Santa, M.D Date: 04/20/2018; Time: 3:09  PM  There are no Patient Instructions on file for this visit.

## 2018-04-20 NOTE — Progress Notes (Signed)
Nursing Pain Medication Assessment:  Safety precautions to be maintained throughout the outpatient stay will include: orient to surroundings, keep bed in low position, maintain call bell within reach at all times, provide assistance with transfer out of bed and ambulation.  Medication Inspection Compliance: [name] did not comply with our request to bring his pills to be counted. He was reminded that bringing the medication bottles, even when empty, is a requirement.  Medication: None brought in. Pill/Patch Count: None available to be counted. Bottle Appearance: No container available. Did not bring bottle(s) to appointment. Filled Date: N/A Last Medication intake:  04/16/2018

## 2018-04-27 LAB — TOXASSURE SELECT 13 (MW), URINE

## 2018-07-10 ENCOUNTER — Encounter: Payer: Self-pay | Admitting: Student in an Organized Health Care Education/Training Program

## 2018-07-11 ENCOUNTER — Encounter (INDEPENDENT_AMBULATORY_CARE_PROVIDER_SITE_OTHER): Payer: Self-pay

## 2018-07-11 ENCOUNTER — Ambulatory Visit
Payer: Medicare Other | Attending: Student in an Organized Health Care Education/Training Program | Admitting: Student in an Organized Health Care Education/Training Program

## 2018-07-11 ENCOUNTER — Other Ambulatory Visit: Payer: Self-pay

## 2018-07-11 DIAGNOSIS — M47816 Spondylosis without myelopathy or radiculopathy, lumbar region: Secondary | ICD-10-CM | POA: Diagnosis not present

## 2018-07-11 DIAGNOSIS — Z9114 Patient's other noncompliance with medication regimen: Secondary | ICD-10-CM | POA: Diagnosis not present

## 2018-07-11 DIAGNOSIS — G894 Chronic pain syndrome: Secondary | ICD-10-CM

## 2018-07-11 DIAGNOSIS — M47812 Spondylosis without myelopathy or radiculopathy, cervical region: Secondary | ICD-10-CM

## 2018-07-11 DIAGNOSIS — M5030 Other cervical disc degeneration, unspecified cervical region: Secondary | ICD-10-CM | POA: Diagnosis not present

## 2018-07-11 DIAGNOSIS — Z91148 Patient's other noncompliance with medication regimen for other reason: Secondary | ICD-10-CM

## 2018-07-11 NOTE — Progress Notes (Signed)
Pain Management Virtual Encounter Note - Virtual Visit via [place] (real-time audio visits between healthcare provider and patient).  Patient's Phone No. & Preferred Pharmacy:  [phone] (home); There is no such number on file (mobile).; (Preferred) [phone] No e-mail address on record  [place], [place] [place] [address] Phone: [phone] Fax: [phone]  [facility] [address] ([address]), [place]) [place] [address] Phone: [phone] Fax: [phone]   Pre-screening note:  Our staff contacted Bryce Haney and offered him an "in person", "face-to-face" appointment versus a telephone encounter. He indicated preferring the telephone encounter, at this time.  Reason for Virtual Visit: COVID-19*  Social distancing based on CDC and AMA recommendations.   I contacted Bryce Haney on 07/11/2018 at 1:33 PM via video conference and clearly identified myself as Bryce Santa, MD. I verified that I was speaking with the correct person using two identifiers (Name and date of birth: 05/01/1950).  Advanced Informed Consent I sought verbal advanced consent from Bryce Haney for virtual visit interactions. I informed Bryce Haney of possible security and privacy concerns, risks, and limitations associated with providing "not-in-person" medical evaluation and management services. I also informed Bryce Haney of the availability of "in-person" appointments. Finally, I informed him that there would be a charge for the virtual visit and that he could be  personally, fully or partially, financially responsible for it. Bryce Haney expressed understanding and agreed to proceed.   Historic Elements   Mr. Bryce Haney is a 68 y.o. year old, male patient evaluated today after his last encounter by our practice on 04/20/2018. Bryce Haney   has a past medical history of Atrial fibrillation ([place]), Back pain, CHF (congestive heart failure) ([place]), COPD (chronic obstructive pulmonary disease) ([place]), Hyperlipidemia, Myocardial infarction ([place]), S/P CABG (coronary artery bypass graft), and Thyroid disease. He also  has a past surgical history that includes Coronary angioplasty; Cardiac catheterization (N/A, 07/24/2014); Coronary angioplasty with stent; Coronary artery bypass graft; Joint replacement (2010); Joint replacement (2016); Colonoscopy with propofol (N/A, 12/30/2016); and Shoulder surgery (Right). Bryce Haney has a current medication list which includes the following prescription(s): aspirin, atorvastatin, furosemide, gabapentin, hydrocodone-acetaminophen, levothyroxine, metoprolol tartrate, mirtazapine, omeprazole, ranitidine, tizanidine, and oxycodone-acetaminophen. He  reports that he has been smoking. He has a 35.00 pack-year smoking history. He has never used smokeless tobacco. He reports that he does not drink alcohol or use drugs. Bryce Haney is allergic to morphine and related.   HPI  I last communicated with him on 04/20/2018. Today, he is being contacted for medication management.    Pharmacotherapy Assessment   06/12/2018  1   04/20/2018  Hydrocodone-Acetamin 10-325 MG  60.00 30 Bi Lat   [id number]   [facility] (4231)   0  20.00 MME  Medicare   [place]     Monitoring: Pharmacotherapy: No side-effects or adverse reactions reported.  PMP: PDMP reviewed during this encounter.          Compliance: UDS+ for THC, violation of pain contract. No additional opioid rx. Plan: Refer to "POC".  Review of recent tests  DG C-Arm 1-60 Min-No Report Fluoroscopy was utilized by the requesting physician.  No radiographic  interpretation.     Office Visit on 04/20/2018  Component Date Value Ref Range Status  . Summary 04/20/2018 FINAL   Final   Comment: ==================================================================== TOXASSURE SELECT 13  (MW) ==================================================================== Test  Result       Flag       Units Drug Present and Declared for Prescription Verification   Norhydrocodone                 40           EXPECTED   ng/mg creat    Norhydrocodone is an expected metabolite of hydrocodone. Drug Present not Declared for Prescription Verification   Carboxy-THC                    719          UNEXPECTED ng/mg creat    Carboxy-THC is a metabolite of tetrahydrocannabinol  (THC).    Source of [facility] is most commonly illicit, but THC is also present    in a scheduled prescription medication. Drug Absent but Declared for Prescription Verification   Hydrocodone                    Not Detected UNEXPECTED ng/mg creat    Hydrocodone is almost always present in patients taking this drug    consistently. Absence of hydrocodone could be due to lapse of    time since the                           last dose or unusual pharmacokinetics (rapid    metabolism). ==================================================================== Test                      Result    Flag   Units      Ref Range   Creatinine              139              mg/dL      >=20 ==================================================================== Declared Medications:  The flagging and interpretation on this report are based on the  following declared medications.  Unexpected results may arise from  inaccuracies in the declared medications.  **Note: The testing scope of this panel includes these medications:  Hydrocodone (Norco)  **Note: The testing scope of this panel does not include following  reported medications:  Acetaminophen (Norco)  Aspirin (Aspirin 81)  Atorvastatin (Lipitor)  Furosemide (Lasix)  Gabapentin (Neurontin)  Levothyroxine  Metoprolol  Mirtazapine (Remeron)  Omeprazole (Prilosec)  Ranitidine  Tizanidine ===============================================================                           ===== For clinical consultation, please call [phone]. ====================================================================   UDS positive for THC.  Violation of pain contract.  No additional opioid medications at this clinic. Assessment  The primary encounter diagnosis was Pain management contract broken. Diagnoses of Spondylosis of cervical region without myelopathy or radiculopathy, DDD (degenerative disc disease), cervical, Cervical facet joint syndrome, Lumbar spondylosis, Chronic pain syndrome, and Lumbar facet joint syndrome were also pertinent to this visit.  Plan of Care  I am having Bryce Haney maintain his levothyroxine, aspirin, gabapentin, mirtazapine, metoprolol tartrate, atorvastatin, furosemide, omeprazole, ranitidine, tiZANidine, oxyCODONE-acetaminophen, and HYDROcodone-acetaminophen.  UDS positive for THC.  I informed the patient that this was a violation of his pain contract.  He endorsed understanding.  I informed the patient that he would be a candidate for interventional therapies going further and can consider repeating cervical facet medial branch nerve blocks or trigger point injections in the future if he is interested.  However opioid  medications will not be a treatment option for him going forward.  Patient endorsed understanding.  Follow-up plan:   Return if symptoms worsen or fail to improve.   I discussed the assessment and treatment plan with the patient. The patient was provided an opportunity to ask questions and all were answered. The patient agreed with the plan and demonstrated an understanding of the instructions.  Total duration of non-face-to-face encounter: 15 minutes.  Note by: Bryce Santa, MD Date: 07/11/2018; Time: 1:33 PM  Disclaimer:  * Given the special circumstances of the COVID-19 pandemic, the federal government has announced that the Office for Civil Rights (OCR) will exercise its enforcement discretion and will not  impose penalties on physicians using telehealth in the event of noncompliance with regulatory requirements under the [place] and Accountability Act (HIPAA) in connection with the good faith provision of telehealth during the IOPPU-68 national public health emergency. ([place])

## 2019-08-19 ENCOUNTER — Emergency Department
Admission: EM | Admit: 2019-08-19 | Discharge: 2019-08-19 | Disposition: A | Discharge status: Home / Self Care | Payer: Medicare Other | Attending: Emergency Medicine | Admitting: Emergency Medicine

## 2019-08-19 ENCOUNTER — Encounter: Payer: Self-pay | Admitting: Emergency Medicine

## 2019-08-19 ENCOUNTER — Emergency Department: Payer: Medicare Other

## 2019-08-19 ENCOUNTER — Other Ambulatory Visit: Payer: Self-pay

## 2019-08-19 DIAGNOSIS — R42 Dizziness and giddiness: Secondary | ICD-10-CM

## 2019-08-19 DIAGNOSIS — R0789 Other chest pain: Secondary | ICD-10-CM | POA: Diagnosis not present

## 2019-08-19 DIAGNOSIS — I2510 Atherosclerotic heart disease of native coronary artery without angina pectoris: Secondary | ICD-10-CM | POA: Diagnosis not present

## 2019-08-19 DIAGNOSIS — Z7982 Long term (current) use of aspirin: Secondary | ICD-10-CM | POA: Insufficient documentation

## 2019-08-19 DIAGNOSIS — R200 Anesthesia of skin: Secondary | ICD-10-CM | POA: Insufficient documentation

## 2019-08-19 DIAGNOSIS — J449 Chronic obstructive pulmonary disease, unspecified: Secondary | ICD-10-CM | POA: Diagnosis not present

## 2019-08-19 DIAGNOSIS — Z955 Presence of coronary angioplasty implant and graft: Secondary | ICD-10-CM | POA: Diagnosis not present

## 2019-08-19 DIAGNOSIS — Z951 Presence of aortocoronary bypass graft: Secondary | ICD-10-CM | POA: Insufficient documentation

## 2019-08-19 DIAGNOSIS — R55 Syncope and collapse: Secondary | ICD-10-CM | POA: Diagnosis not present

## 2019-08-19 DIAGNOSIS — R202 Paresthesia of skin: Secondary | ICD-10-CM | POA: Diagnosis not present

## 2019-08-19 DIAGNOSIS — Z96653 Presence of artificial knee joint, bilateral: Secondary | ICD-10-CM | POA: Diagnosis not present

## 2019-08-19 DIAGNOSIS — Z79899 Other long term (current) drug therapy: Secondary | ICD-10-CM | POA: Diagnosis not present

## 2019-08-19 DIAGNOSIS — E039 Hypothyroidism, unspecified: Secondary | ICD-10-CM | POA: Diagnosis not present

## 2019-08-19 DIAGNOSIS — F17200 Nicotine dependence, unspecified, uncomplicated: Secondary | ICD-10-CM | POA: Insufficient documentation

## 2019-08-19 LAB — BASIC METABOLIC PANEL
Anion gap: 8 (ref 5–15)
BUN: 11 mg/dL (ref 8–23)
CO2: 28 mmol/L (ref 22–32)
Calcium: 9.3 mg/dL (ref 8.9–10.3)
Chloride: 103 mmol/L (ref 98–111)
Creatinine, Ser: 0.99 mg/dL (ref 0.61–1.24)
GFR calc Af Amer: >60 mL/min (ref >60)
GFR calc non Af Amer: >60 mL/min (ref >60)
Glucose, Bld: 93 mg/dL (ref 70–99)
Potassium: 3.7 mmol/L (ref 3.5–5.1)
Sodium: 139 mmol/L (ref 135–145)

## 2019-08-19 LAB — CBC
HCT: 47 % (ref 39.0–52.0)
Hemoglobin: 15.9 g/dL (ref 13.0–17.0)
MCH: 30 pg (ref 26.0–34.0)
MCHC: 33.8 g/dL (ref 30.0–36.0)
MCV: 88.7 fL (ref 80.0–100.0)
Platelets: 248 10*3/uL (ref 150–400)
RBC: 5.3 MIL/uL (ref 4.22–5.81)
RDW: 13 % (ref 11.5–15.5)
WBC: 7.4 10*3/uL (ref 4.0–10.5)
nRBC: 0 % (ref 0.0–0.2)

## 2019-08-19 LAB — TROPONIN I (HIGH SENSITIVITY)
Troponin I (High Sensitivity): 9 ng/L (ref <18)
Troponin I (High Sensitivity): 7 ng/L (ref <18)

## 2019-08-19 MED ORDER — LACTATED RINGERS IV BOLUS
1000.0000 mL | Freq: Once | INTRAVENOUS | Status: AC
  Administered 2019-08-19: 1000 mL via INTRAVENOUS

## 2019-08-19 MED ORDER — SODIUM CHLORIDE 0.9% FLUSH
3.0000 mL | Freq: Once | INTRAVENOUS | Status: AC
  Administered 2019-08-19: 3 mL via INTRAVENOUS

## 2019-08-19 MED ORDER — ACETAMINOPHEN 500 MG PO TABS
1000.0000 mg | ORAL_TABLET | Freq: Once | ORAL | Status: AC
  Administered 2019-08-19: 1000 mg via ORAL
  Filled 2019-08-19: qty 2

## 2019-08-19 NOTE — ED Triage Notes (Signed)
Pt reports dizziness yesterday that subsided and left him with a headache.  States this morning dizziness has returned and had an episode of CP.  States took BP and it was elevated.

## 2019-08-19 NOTE — ED Provider Notes (Signed)
[facility] Emergency Department Provider Note   ____________________________________________   First MD Initiated Contact with Patient 08/19/19 [phone]     (approximate)  I have reviewed the triage vital signs and the nursing notes.   HISTORY  Chief Complaint Dizziness and Chest Pain    HPI Bryce Haney is a 69 y.o. male with past medical history of CAD status post CABG, atrial fibrillation, CHF, hypertension, and hyperlipidemia who presents to the ED complaining of dizziness and chest pain.  Patient reports that each of the past 2 mornings he has felt very lightheaded and dizzy when he goes to get up out of bed.  He states it feels like things will be going black around him with white spots in his vision and a feeling that he might pass out.  He then sits down and symptoms seem to subside over the next 15 to 30 minutes.  This has not been associated with any chest pain or shortness of breath, he has otherwise felt well with no fevers or cough.  He had another episode this morning that resolved, however when he went to get in his car and drive he noticed sudden onset sharp pain in the center of his chest.  It radiated down his left arm with some tingling in his left hand.  The pain in his chest has since resolved but he endorses some ongoing tingling in his left hand.  He felt lightheaded and dizzy again when changing his position for chest x-ray.        Past Medical History:  Diagnosis Date  . Atrial fibrillation ([place])   . Back pain   . CHF (congestive heart failure) ([place])   . COPD (chronic obstructive pulmonary disease) ([place])   . Hyperlipidemia   . Myocardial infarction ([place])   . S/P CABG (coronary artery bypass graft)   . Thyroid disease     Patient Active Problem List   Diagnosis Date Noted  . Spondylosis of cervical region without myelopathy or radiculopathy 10/27/2017  . DDD (degenerative disc disease), cervical 10/27/2017  . Cervical facet  joint syndrome 10/27/2017  . Chronic pain syndrome 10/27/2017  . Special screening for malignant neoplasms, colon   . Lower GI bleed 10/19/2016  . Leukocytosis 10/19/2016  . Diverticulitis large intestine 10/19/2016  . Acute renal insufficiency 10/19/2016  . Hyponatremia 10/19/2016  . Coronary artery disease involving autologous artery coronary bypass graft 06/30/2016  . Paroxysmal A-fib ([place]) 07/08/2015  . Pneumonia 06/19/2015  . Phlebitis and thrombophlebitis of superficial vessels of lower extremities, bilateral 06/05/2015  . S/P CABG x 2 05/29/2015  . Chronic depression 05/23/2015  . GERD (gastroesophageal reflux disease) 05/23/2015  . CAD (coronary artery disease) 11/01/2012  . Mixed hyperlipidemia 11/01/2012  . Hypothyroidism, unspecified 10/25/2012  . STEMI (ST elevation myocardial infarction) ([place]) 10/25/2012    Past Surgical History:  Procedure Laterality Date  . CARDIAC CATHETERIZATION N/A 07/24/2014   Procedure: Left Heart Cath;  Surgeon: Corey Skains, MD;  Location: [facility];  Service: Cardiovascular;  Laterality: N/A;  . COLONOSCOPY WITH PROPOFOL N/A 12/30/2016   Procedure: COLONOSCOPY WITH PROPOFOL;  Surgeon: Lin Landsman, MD;  Location: [facility] ENDOSCOPY;  Service: Gastroenterology;  Laterality: N/A;  . CORONARY ANGIOPLASTY    . CORONARY ANGIOPLASTY WITH STENT PLACEMENT    . CORONARY ARTERY BYPASS GRAFT    . JOINT REPLACEMENT  2010   right knee replacement   . JOINT REPLACEMENT  2016   left knee replacement  .  SHOULDER SURGERY Right     Prior to Admission medications   Medication Sig Start Date End Date Taking? Authorizing Provider  aspirin 81 MG tablet Take 81 mg by mouth daily.   Yes [provider]  atorvastatin (LIPITOR) 40 MG tablet Take 40 mg by mouth daily.   Yes [provider]  gabapentin (NEURONTIN) 300 MG capsule Take 300 mg by mouth at bedtime.    Yes [provider]  levothyroxine (SYNTHROID,  LEVOTHROID) 150 MCG tablet Take 150 mcg by mouth daily before breakfast.   Yes [provider]  losartan (COZAAR) 25 MG tablet Take 25 mg by mouth daily. 08/08/19  Yes [provider]  methocarbamol (ROBAXIN) 750 MG tablet Take 750 mg by mouth 3 (three) times daily. 08/08/19  Yes [provider]  mirtazapine (REMERON SOL-TAB) 30 MG disintegrating tablet Take 30 mg by mouth at bedtime. 07/04/19  Yes [provider]  omeprazole (PRILOSEC) 20 MG capsule Take 20 mg by mouth daily.   Yes [provider]    Allergies Morphine and related  Family History  Problem Relation Age of Onset  . Heart disease Mother   . Heart disease Father     Social History Social History   Tobacco Use  . Smoking status: Current Every Day Smoker    Packs/day: 1.00    Years: 35.00    Pack years: 35.00  . Smokeless tobacco: Never Used  Vaping Use  . Vaping Use: Never used  Substance Use Topics  . Alcohol use: No    Alcohol/week: 0.0 standard drinks  . Drug use: No    Review of Systems  Constitutional: No fever/chills.  Positive for lightheadedness and dizziness. Eyes: No visual changes. ENT: No sore throat. Cardiovascular: Positive for chest pain. Respiratory: Denies shortness of breath. Gastrointestinal: No abdominal pain.  No nausea, no vomiting.  No diarrhea.  No constipation. Genitourinary: Negative for dysuria. Musculoskeletal: Negative for back pain. Skin: Negative for rash. Neurological: Negative for headaches or weakness.  Positive for numbness and tingling to left hand.  ____________________________________________   PHYSICAL EXAM:  VITAL SIGNS: ED Triage Vitals  Enc Vitals Group     BP 08/19/19 0917 (!) 162/86     Pulse Rate 08/19/19 0917 62     Resp 08/19/19 0917 20     Temp 08/19/19 0917 98 F (36.7 C)     Temp Source 08/19/19 0917 Oral     SpO2 08/19/19 0917 94 %     Weight 08/19/19 0909 215 lb (97.5 kg)     Height 08/19/19 0909 6'  (1.829 m)     Head Circumference --      Peak Flow --      Pain Score 08/19/19 0908 0     Pain Loc --      Pain Edu? --      Excl. in [place]? --     Constitutional: Alert and oriented. Eyes: Conjunctivae are normal. Head: Atraumatic. Nose: No congestion/rhinnorhea. Mouth/Throat: Mucous membranes are moist. Neck: Normal ROM Cardiovascular: Normal rate, regular rhythm. Grossly normal heart sounds.  2+ radial pulses bilaterally. Respiratory: Normal respiratory effort.  No retractions. Lungs CTAB. Gastrointestinal: Soft and nontender. No distention. Genitourinary: deferred Musculoskeletal: No lower extremity tenderness nor edema. Neurologic:  Normal speech and language. No gross focal neurologic deficits are appreciated. Skin:  Skin is warm, dry and intact. No rash noted. Psychiatric: Mood and affect are normal. Speech and behavior are normal.  ____________________________________________   LABS (all labs  ordered are listed, but only abnormal results are displayed)  Labs Reviewed  BASIC METABOLIC PANEL  CBC  TROPONIN I (HIGH SENSITIVITY)  TROPONIN I (HIGH SENSITIVITY)   ____________________________________________  EKG  ED ECG REPORT I, Blake Divine, the attending physician, personally viewed and interpreted this ECG.   Date: 08/19/2019  EKG Time: 9:08  Rate: 69  Rhythm: normal EKG, normal sinus rhythm, unchanged from previous tracings  Axis: LAD  Intervals:none  ST&T Change: None   PROCEDURES  Procedure(s) performed (including Critical Care):  Procedures   ____________________________________________   INITIAL IMPRESSION / ASSESSMENT AND PLAN / ED COURSE       69 year old male presents to the ED with intermittent episodes of lightheadedness and near syncope each of the past 2 mornings when getting up from bed, after the episode today he had some sharp chest pain that has now resolved.  Chest pain sounds atypical and EKG is unremarkable.  Patient did have a  stress test in November of last year that was negative for acute ischemic changes.  Check two sets of troponin and if these are unremarkable, do not suspect ACS.  Low suspicion of stroke given paresthesias associated with chest pain, no focal deficits noted on neurologic exam.  Orthostatic vital signs are unremarkable, we will hydrate with IV fluids and reassess.  Patient feeling much improved following IV fluid bolus, now able to stand up and walk to the bathroom without any lightheadedness or near syncope.  2 sets of troponin are negative and all tingling in his left arm has resolved.  I doubt cardiac etiology of his symptoms and he was advised to drink plenty of water, follow-up with his cardiologist.  Patient agrees with plan.      ____________________________________________   FINAL CLINICAL IMPRESSION(S) / ED DIAGNOSES  Final diagnoses:  Lightheadedness  Atypical chest pain     ED Discharge Orders    None       Note:  This document was prepared using Dragon voice recognition software and may include unintentional dictation errors.   Blake Divine, MD 08/19/19 1254

## 2019-08-19 NOTE — ED Notes (Signed)
This RN attempted peripheral IV and blood work twice. Unsuccessful attempt.

## 2019-08-19 NOTE — ED Notes (Signed)
Pt presents to the ED for dizziness and a short period of CP. Pt has a extensive cardiac history involving a double bypass surgery in March of 2017. Pt also c/o L sided numbness in his hands. Pt is A&Ox4 and NAD at this time.

## 2023-08-27 ENCOUNTER — Inpatient Hospital Stay
Admission: EM | Admit: 2023-08-27 | Discharge: 2023-08-29 | DRG: 313 | Disposition: A | Discharge status: Home / Self Care | Attending: Internal Medicine | Admitting: Internal Medicine

## 2023-08-27 ENCOUNTER — Emergency Department

## 2023-08-27 ENCOUNTER — Encounter: Payer: Self-pay | Admitting: Emergency Medicine

## 2023-08-27 ENCOUNTER — Other Ambulatory Visit: Payer: Self-pay

## 2023-08-27 ENCOUNTER — Observation Stay
Admit: 2023-08-27 | Discharge: 2023-08-27 | Disposition: A | Discharge status: Home / Self Care | Attending: Cardiovascular Disease | Admitting: Cardiovascular Disease

## 2023-08-27 DIAGNOSIS — Z7982 Long term (current) use of aspirin: Secondary | ICD-10-CM

## 2023-08-27 DIAGNOSIS — M109 Gout, unspecified: Secondary | ICD-10-CM | POA: Diagnosis present

## 2023-08-27 DIAGNOSIS — E785 Hyperlipidemia, unspecified: Secondary | ICD-10-CM

## 2023-08-27 DIAGNOSIS — R296 Repeated falls: Secondary | ICD-10-CM | POA: Diagnosis present

## 2023-08-27 DIAGNOSIS — R0781 Pleurodynia: Secondary | ICD-10-CM

## 2023-08-27 DIAGNOSIS — R0602 Shortness of breath: Secondary | ICD-10-CM | POA: Diagnosis not present

## 2023-08-27 DIAGNOSIS — Z79890 Hormone replacement therapy (postmenopausal): Secondary | ICD-10-CM

## 2023-08-27 DIAGNOSIS — R079 Chest pain, unspecified: Secondary | ICD-10-CM | POA: Diagnosis not present

## 2023-08-27 DIAGNOSIS — I2510 Atherosclerotic heart disease of native coronary artery without angina pectoris: Secondary | ICD-10-CM | POA: Diagnosis present

## 2023-08-27 DIAGNOSIS — F32A Depression, unspecified: Secondary | ICD-10-CM | POA: Diagnosis present

## 2023-08-27 DIAGNOSIS — I480 Paroxysmal atrial fibrillation: Secondary | ICD-10-CM

## 2023-08-27 DIAGNOSIS — I10 Essential (primary) hypertension: Secondary | ICD-10-CM

## 2023-08-27 DIAGNOSIS — G894 Chronic pain syndrome: Secondary | ICD-10-CM | POA: Diagnosis present

## 2023-08-27 DIAGNOSIS — E876 Hypokalemia: Secondary | ICD-10-CM | POA: Diagnosis present

## 2023-08-27 DIAGNOSIS — F419 Anxiety disorder, unspecified: Secondary | ICD-10-CM | POA: Diagnosis present

## 2023-08-27 DIAGNOSIS — I25118 Atherosclerotic heart disease of native coronary artery with other forms of angina pectoris: Secondary | ICD-10-CM

## 2023-08-27 DIAGNOSIS — E039 Hypothyroidism, unspecified: Secondary | ICD-10-CM | POA: Diagnosis present

## 2023-08-27 DIAGNOSIS — I252 Old myocardial infarction: Secondary | ICD-10-CM

## 2023-08-27 DIAGNOSIS — F17210 Nicotine dependence, cigarettes, uncomplicated: Secondary | ICD-10-CM | POA: Diagnosis present

## 2023-08-27 DIAGNOSIS — I110 Hypertensive heart disease with heart failure: Secondary | ICD-10-CM | POA: Diagnosis present

## 2023-08-27 DIAGNOSIS — I739 Peripheral vascular disease, unspecified: Secondary | ICD-10-CM | POA: Diagnosis present

## 2023-08-27 DIAGNOSIS — H9190 Unspecified hearing loss, unspecified ear: Secondary | ICD-10-CM | POA: Diagnosis present

## 2023-08-27 DIAGNOSIS — R0789 Other chest pain: Principal | ICD-10-CM | POA: Diagnosis present

## 2023-08-27 DIAGNOSIS — R55 Syncope and collapse: Secondary | ICD-10-CM

## 2023-08-27 DIAGNOSIS — Y929 Unspecified place or not applicable: Secondary | ICD-10-CM

## 2023-08-27 DIAGNOSIS — Z96653 Presence of artificial knee joint, bilateral: Secondary | ICD-10-CM | POA: Diagnosis present

## 2023-08-27 DIAGNOSIS — I4892 Unspecified atrial flutter: Secondary | ICD-10-CM | POA: Diagnosis present

## 2023-08-27 DIAGNOSIS — R0609 Other forms of dyspnea: Secondary | ICD-10-CM

## 2023-08-27 DIAGNOSIS — R001 Bradycardia, unspecified: Secondary | ICD-10-CM

## 2023-08-27 DIAGNOSIS — Z8249 Family history of ischemic heart disease and other diseases of the circulatory system: Secondary | ICD-10-CM

## 2023-08-27 DIAGNOSIS — Z885 Allergy status to narcotic agent status: Secondary | ICD-10-CM

## 2023-08-27 DIAGNOSIS — J449 Chronic obstructive pulmonary disease, unspecified: Secondary | ICD-10-CM | POA: Diagnosis present

## 2023-08-27 DIAGNOSIS — Z951 Presence of aortocoronary bypass graft: Secondary | ICD-10-CM

## 2023-08-27 DIAGNOSIS — Z955 Presence of coronary angioplasty implant and graft: Secondary | ICD-10-CM

## 2023-08-27 DIAGNOSIS — E782 Mixed hyperlipidemia: Secondary | ICD-10-CM | POA: Diagnosis present

## 2023-08-27 DIAGNOSIS — W19XXXA Unspecified fall, initial encounter: Secondary | ICD-10-CM

## 2023-08-27 DIAGNOSIS — Z79899 Other long term (current) drug therapy: Secondary | ICD-10-CM

## 2023-08-27 DIAGNOSIS — Z1152 Encounter for screening for COVID-19: Secondary | ICD-10-CM

## 2023-08-27 DIAGNOSIS — K219 Gastro-esophageal reflux disease without esophagitis: Secondary | ICD-10-CM | POA: Diagnosis present

## 2023-08-27 DIAGNOSIS — W2203XA Walked into furniture, initial encounter: Secondary | ICD-10-CM | POA: Diagnosis present

## 2023-08-27 LAB — ECHOCARDIOGRAM COMPLETE
AR max vel: 2.51 cm2
AV Area VTI: 2.41 cm2
AV Area mean vel: 2.24 cm2
AV Mean grad: 4 mmHg
AV Peak grad: 7.7 mmHg
Ao pk vel: 1.39 m/s
Area-P 1/2: 3.87 cm2
Calc EF: 51.6 %
Height: 72 in
MV VTI: 1.82 cm2
S' Lateral: 3.6 cm
Single Plane A2C EF: 51.2 %
Single Plane A4C EF: 51.2 %
Weight: 3439.18 [oz_av]

## 2023-08-27 LAB — LIPID PANEL
Cholesterol: 109 mg/dL (ref 0–200)
HDL: 45 mg/dL (ref >40)
LDL Cholesterol: 57 mg/dL (ref 0–99)
Total CHOL/HDL Ratio: 2.4 ratio
Triglycerides: 37 mg/dL (ref <150)
VLDL: 7 mg/dL (ref 0–40)

## 2023-08-27 LAB — URINALYSIS, W/ REFLEX TO CULTURE (INFECTION SUSPECTED)
Bacteria, UA: NONE SEEN
Bilirubin Urine: NEGATIVE
Glucose, UA: NEGATIVE mg/dL
Hgb urine dipstick: NEGATIVE
Ketones, ur: NEGATIVE mg/dL
Leukocytes,Ua: NEGATIVE
Nitrite: NEGATIVE
Protein, ur: NEGATIVE mg/dL
RBC / HPF: 0 RBC/hpf (ref 0–5)
Specific Gravity, Urine: 1.005 (ref 1.005–1.030)
Squamous Epithelial / HPF: 0 /HPF (ref 0–5)
pH: 6 (ref 5.0–8.0)

## 2023-08-27 LAB — COMPREHENSIVE METABOLIC PANEL WITH GFR
ALT: 42 U/L (ref 0–44)
AST: 36 U/L (ref 15–41)
Albumin: 4.4 g/dL (ref 3.5–5.0)
Alkaline Phosphatase: 52 U/L (ref 38–126)
Anion gap: 11 (ref 5–15)
BUN: 13 mg/dL (ref 8–23)
CO2: 21 mmol/L — ABNORMAL LOW (ref 22–32)
Calcium: 9.7 mg/dL (ref 8.9–10.3)
Chloride: 103 mmol/L (ref 98–111)
Creatinine, Ser: 0.97 mg/dL (ref 0.61–1.24)
GFR, Estimated: >60 mL/min (ref >60)
Glucose, Bld: 100 mg/dL — ABNORMAL HIGH (ref 70–99)
Potassium: 3.1 mmol/L — ABNORMAL LOW (ref 3.5–5.1)
Sodium: 135 mmol/L (ref 135–145)
Total Bilirubin: 0.6 mg/dL (ref 0.0–1.2)
Total Protein: 7.1 g/dL (ref 6.5–8.1)

## 2023-08-27 LAB — CBC WITH DIFFERENTIAL/PLATELET
Abs Immature Granulocytes: 0.09 10*3/uL — ABNORMAL HIGH (ref 0.00–0.07)
Basophils Absolute: 0.1 10*3/uL (ref 0.0–0.1)
Basophils Relative: 1 %
Eosinophils Absolute: 0 10*3/uL (ref 0.0–0.5)
Eosinophils Relative: 0 %
HCT: 45 % (ref 39.0–52.0)
Hemoglobin: 15.7 g/dL (ref 13.0–17.0)
Immature Granulocytes: 1 %
Lymphocytes Relative: 6 %
Lymphs Abs: 1 10*3/uL (ref 0.7–4.0)
MCH: 32.4 pg (ref 26.0–34.0)
MCHC: 34.9 g/dL (ref 30.0–36.0)
MCV: 93 fL (ref 80.0–100.0)
Monocytes Absolute: 0.8 10*3/uL (ref 0.1–1.0)
Monocytes Relative: 4 %
Neutro Abs: 15.6 10*3/uL — ABNORMAL HIGH (ref 1.7–7.7)
Neutrophils Relative %: 88 %
Platelets: 213 10*3/uL (ref 150–400)
RBC: 4.84 MIL/uL (ref 4.22–5.81)
RDW: 12.4 % (ref 11.5–15.5)
WBC: 17.6 10*3/uL — ABNORMAL HIGH (ref 4.0–10.5)
nRBC: 0 % (ref 0.0–0.2)

## 2023-08-27 LAB — URINE DRUG SCREEN, QUALITATIVE (ARMC ONLY)
Amphetamines, Ur Screen: NOT DETECTED
Barbiturates, Ur Screen: NOT DETECTED
Benzodiazepine, Ur Scrn: NOT DETECTED
Cannabinoid 50 Ng, Ur ~~LOC~~: POSITIVE — AB
Cocaine Metabolite,Ur ~~LOC~~: NOT DETECTED
MDMA (Ecstasy)Ur Screen: NOT DETECTED
Methadone Scn, Ur: NOT DETECTED
Opiate, Ur Screen: NOT DETECTED
Phencyclidine (PCP) Ur S: POSITIVE — AB
Tricyclic, Ur Screen: NOT DETECTED

## 2023-08-27 LAB — D-DIMER, QUANTITATIVE: D-Dimer, Quant: <0.27 ug{FEU}/mL (ref 0.00–0.50)

## 2023-08-27 LAB — HEMOGLOBIN A1C
Hgb A1c MFr Bld: 5.3 % (ref 4.8–5.6)
Mean Plasma Glucose: 105.41 mg/dL

## 2023-08-27 LAB — TROPONIN I (HIGH SENSITIVITY)
Troponin I (High Sensitivity): 21 ng/L — ABNORMAL HIGH (ref <18)
Troponin I (High Sensitivity): 35 ng/L — ABNORMAL HIGH (ref <18)
Troponin I (High Sensitivity): 97 ng/L — ABNORMAL HIGH (ref <18)

## 2023-08-27 LAB — RESP PANEL BY RT-PCR (RSV, FLU A&B, COVID)  RVPGX2
Influenza A by PCR: NEGATIVE
Influenza B by PCR: NEGATIVE
Resp Syncytial Virus by PCR: NEGATIVE
SARS Coronavirus 2 by RT PCR: NEGATIVE

## 2023-08-27 LAB — MAGNESIUM: Magnesium: 1.5 mg/dL — ABNORMAL LOW (ref 1.7–2.4)

## 2023-08-27 LAB — BLOOD GAS, VENOUS
Acid-Base Excess: 0.3 mmol/L (ref 0.0–2.0)
Bicarbonate: 25.4 mmol/L (ref 20.0–28.0)
O2 Saturation: 77.5 %
Patient temperature: 37
pCO2, Ven: 42 mmHg — ABNORMAL LOW (ref 44–60)
pH, Ven: 7.39 (ref 7.25–7.43)
pO2, Ven: 42 mmHg (ref 32–45)

## 2023-08-27 LAB — T4, FREE: Free T4: 0.96 ng/dL (ref 0.61–1.12)

## 2023-08-27 LAB — ETHANOL: Alcohol, Ethyl (B): <15 mg/dL (ref <15)

## 2023-08-27 LAB — BRAIN NATRIURETIC PEPTIDE: B Natriuretic Peptide: 65.7 pg/mL (ref 0.0–100.0)

## 2023-08-27 LAB — TSH: TSH: 12.373 u[IU]/mL — ABNORMAL HIGH (ref 0.350–4.500)

## 2023-08-27 LAB — HIV ANTIBODY (ROUTINE TESTING W REFLEX): HIV Screen 4th Generation wRfx: NONREACTIVE

## 2023-08-27 LAB — PROCALCITONIN: Procalcitonin: <0.1 ng/mL

## 2023-08-27 MED ORDER — GABAPENTIN 300 MG PO CAPS
300.0000 mg | ORAL_CAPSULE | Freq: Every day | ORAL | Status: DC
  Administered 2023-08-27 – 2023-08-28 (×2): 300 mg via ORAL
  Filled 2023-08-27 (×2): qty 1

## 2023-08-27 MED ORDER — KETOROLAC TROMETHAMINE 15 MG/ML IJ SOLN
15.0000 mg | Freq: Four times a day (QID) | INTRAMUSCULAR | Status: DC | PRN
  Administered 2023-08-27 – 2023-08-28 (×3): 15 mg via INTRAVENOUS
  Filled 2023-08-27 (×3): qty 1

## 2023-08-27 MED ORDER — LEVOTHYROXINE SODIUM 50 MCG PO TABS
150.0000 ug | ORAL_TABLET | Freq: Every day | ORAL | Status: DC
  Administered 2023-08-27 – 2023-08-29 (×3): 150 ug via ORAL
  Filled 2023-08-27 (×2): qty 1
  Filled 2023-08-27: qty 3

## 2023-08-27 MED ORDER — PANTOPRAZOLE SODIUM 40 MG PO TBEC
40.0000 mg | DELAYED_RELEASE_TABLET | Freq: Every day | ORAL | Status: DC
  Administered 2023-08-27 – 2023-08-29 (×3): 40 mg via ORAL
  Filled 2023-08-27 (×3): qty 1

## 2023-08-27 MED ORDER — MELATONIN 5 MG PO TABS
5.0000 mg | ORAL_TABLET | Freq: Every evening | ORAL | Status: DC | PRN
  Administered 2023-08-27: 5 mg via ORAL
  Filled 2023-08-27: qty 1

## 2023-08-27 MED ORDER — SODIUM CHLORIDE 0.9 % IV SOLN: INTRAVENOUS | Status: AC

## 2023-08-27 MED ORDER — LIDOCAINE 5 % EX PTCH
1.0000 | MEDICATED_PATCH | CUTANEOUS | Status: DC
  Administered 2023-08-27 – 2023-08-29 (×3): 1 via TRANSDERMAL
  Filled 2023-08-27 (×3): qty 1

## 2023-08-27 MED ORDER — POLYETHYLENE GLYCOL 3350 17 G PO PACK: 17.0000 g | PACK | Freq: Every day | ORAL | Status: DC | PRN

## 2023-08-27 MED ORDER — ATORVASTATIN CALCIUM 20 MG PO TABS
40.0000 mg | ORAL_TABLET | Freq: Every day | ORAL | Status: DC
  Administered 2023-08-27 – 2023-08-29 (×3): 40 mg via ORAL
  Filled 2023-08-27 (×4): qty 2

## 2023-08-27 MED ORDER — MAGNESIUM SULFATE 2 GM/50ML IV SOLN
2.0000 g | Freq: Once | INTRAVENOUS | Status: AC
  Administered 2023-08-27: 2 g via INTRAVENOUS
  Filled 2023-08-27: qty 50

## 2023-08-27 MED ORDER — BUSPIRONE HCL 10 MG PO TABS
15.0000 mg | ORAL_TABLET | Freq: Two times a day (BID) | ORAL | Status: DC
  Administered 2023-08-27 – 2023-08-29 (×5): 15 mg via ORAL
  Filled 2023-08-27: qty 2
  Filled 2023-08-27: qty 3
  Filled 2023-08-27 (×3): qty 2

## 2023-08-27 MED ORDER — POTASSIUM CHLORIDE CRYS ER 20 MEQ PO TBCR
40.0000 meq | EXTENDED_RELEASE_TABLET | Freq: Once | ORAL | Status: AC
  Administered 2023-08-27: 40 meq via ORAL
  Filled 2023-08-27: qty 2

## 2023-08-27 MED ORDER — ACETAMINOPHEN 325 MG PO TABS
650.0000 mg | ORAL_TABLET | ORAL | Status: DC | PRN
  Administered 2023-08-28: 650 mg via ORAL
  Filled 2023-08-27: qty 2

## 2023-08-27 MED ORDER — ENOXAPARIN SODIUM 40 MG/0.4ML IJ SOSY
40.0000 mg | PREFILLED_SYRINGE | INTRAMUSCULAR | Status: DC
  Administered 2023-08-27 – 2023-08-29 (×3): 40 mg via SUBCUTANEOUS
  Filled 2023-08-27 (×3): qty 0.4

## 2023-08-27 MED ORDER — NITROGLYCERIN 0.4 MG SL SUBL
0.4000 mg | SUBLINGUAL_TABLET | SUBLINGUAL | Status: DC | PRN
  Filled 2023-08-27: qty 1

## 2023-08-27 MED ORDER — LOSARTAN POTASSIUM 25 MG PO TABS
25.0000 mg | ORAL_TABLET | Freq: Every day | ORAL | Status: DC
  Administered 2023-08-27 – 2023-08-29 (×3): 25 mg via ORAL
  Filled 2023-08-27 (×3): qty 1

## 2023-08-27 MED ORDER — ASPIRIN 81 MG PO TBEC
81.0000 mg | DELAYED_RELEASE_TABLET | Freq: Every day | ORAL | Status: DC
  Administered 2023-08-27 – 2023-08-29 (×3): 81 mg via ORAL
  Filled 2023-08-27 (×3): qty 1

## 2023-08-27 MED ORDER — ONDANSETRON HCL 4 MG/2ML IJ SOLN: 4.0000 mg | Freq: Four times a day (QID) | INTRAMUSCULAR | Status: DC | PRN

## 2023-08-27 MED ORDER — ASPIRIN 81 MG PO CHEW: 324.0000 mg | CHEWABLE_TABLET | Freq: Once | ORAL | Status: DC

## 2023-08-27 NOTE — ED Provider Notes (Signed)
 [facility] Provider Note    Event Date/Time   First MD Initiated Contact with Patient 08/27/23 0031     (approximate)   History   Shortness of Breath   HPI  Bryce Haney is a 73 y.o. male with history of atrial fibrillation, CHF, COPD, CAD status post CABG, hypothyroidism who presents to the emergency department with complaints of increased chest discomfort that he describes as a tightness, shortness of breath that started tonight.  He also reports feeling very jittery and feeling like there is jumping in his legs.  He is very somnolent here but arousable to voice.  He denies any drug or alcohol use.  Reports that he was concerned because he was bradycardic with a heart rate in the 40s at home and states he was told previously in [place]  that he needed a pacemaker but at that time did not have supplemental health insurance so declined.  States he returned to [place]  from [place]  about a week and a half ago.  He does report a fall recently and states he hit his left ribs on a table.  He does not think that he hit his head.  He is not on any blood thinners despite his history of atrial fibrillation.   History provided by patient, EMS.    Past Medical History:  Diagnosis Date   Atrial fibrillation (HCC)    Back pain    CHF (congestive heart failure) (HCC)    COPD (chronic obstructive pulmonary disease) (HCC)    Hyperlipidemia    Myocardial infarction (HCC)    S/P CABG (coronary artery bypass graft)    Thyroid disease     Past Surgical History:  Procedure Laterality Date   CARDIAC CATHETERIZATION N/A 07/24/2014   Procedure: Left Heart Cath;  Surgeon: Wolm JINNY Rhyme, MD;  Location: [facility] INVASIVE CV LAB;  Service: Cardiovascular;  Laterality: N/A;   COLONOSCOPY WITH PROPOFOL  N/A 12/30/2016   Procedure: COLONOSCOPY WITH PROPOFOL ;  Surgeon: Unk Corinn Skiff, MD;  Location: [facility] ENDOSCOPY;  Service: Gastroenterology;  Laterality: N/A;    CORONARY ANGIOPLASTY     CORONARY ANGIOPLASTY WITH STENT PLACEMENT     CORONARY ARTERY BYPASS GRAFT     JOINT REPLACEMENT  2010   right knee replacement    JOINT REPLACEMENT  2016   left knee replacement   SHOULDER SURGERY Right     MEDICATIONS:  Prior to Admission medications   Medication Sig Start Date End Date Taking? Authorizing Provider  aspirin  81 MG tablet Take 81 mg by mouth daily.    [provider]  atorvastatin  (LIPITOR) 40 MG tablet Take 40 mg by mouth daily.    [provider]  gabapentin  (NEURONTIN ) 300 MG capsule Take 300 mg by mouth at bedtime.     [provider]  levothyroxine  (SYNTHROID , LEVOTHROID) 150 MCG tablet Take 150 mcg by mouth daily before breakfast.    [provider]  losartan  (COZAAR ) 25 MG tablet Take 25 mg by mouth daily. 08/08/19   [provider]  methocarbamol  (ROBAXIN ) 750 MG tablet Take 750 mg by mouth 3 (three) times daily. 08/08/19   [provider]  mirtazapine  (REMERON  SOL-TAB) 30 MG disintegrating tablet Take 30 mg by mouth at bedtime. 07/04/19   [provider]  omeprazole (PRILOSEC) 20 MG capsule Take 20 mg by mouth daily.    [provider]    Physical Exam   Triage Vital Signs: ED Triage Vitals  Encounter Vitals Group  BP 08/27/23 0042 (!) 151/85     Girls Systolic BP Percentile --      Girls Diastolic BP Percentile --      Boys Systolic BP Percentile --      Boys Diastolic BP Percentile --      Pulse Rate 08/27/23 0042 80     Resp 08/27/23 0042 20     Temp 08/27/23 0042 98.5 F (36.9 C)     Temp Source 08/27/23 0042 Oral     SpO2 08/27/23 0042 96 %     Weight 08/27/23 0031 214 lb 15.2 oz (97.5 kg)     Height 08/27/23 0031 6' (1.829 m)     Head Circumference --      Peak Flow --      Pain Score 08/27/23 0030 0     Pain Loc --      Pain Education --      Exclude from Growth Chart --     Most recent vital signs: Vitals:   08/27/23 0245 08/27/23 0500   BP: (!) 140/68   Pulse: 74   Resp: 19   Temp:  98.7 F (37.1 C)  SpO2: 100%     CONSTITUTIONAL: Alert, responds appropriately to questions.  Elderly, chronically ill-appearing, somnolent but arousable to voice HEAD: Normocephalic, atraumatic EYES: Conjunctivae clear, pupils appear equal, sclera nonicteric ENT: normal nose; moist mucous membranes NECK: Supple, normal ROM CARD: RRR; S1 and S2 appreciated RESP: Normal chest excursion without splinting or tachypnea; breath sounds clear and equal bilaterally; no wheezes, no rhonchi, no rales, no hypoxia or respiratory distress, speaking full sentences ABD/GI: Non-distended; soft, non-tender, no rebound, no guarding, no peritoneal signs BACK: The back appears normal EXT: Normal ROM in all joints; no deformity noted, no edema, no calf tenderness or calf swelling SKIN: Normal color for age and race; warm; no rash on exposed skin NEURO: Moves all extremities equally, normal speech, drowsy but arousable, no facial asymmetry, normal sensation, no seizure-like activity PSYCH: The patient's mood and manner are appropriate.   ED Results / Procedures / Treatments   LABS: (all labs ordered are listed, but only abnormal results are displayed) Labs Reviewed  CBC WITH DIFFERENTIAL/PLATELET - Abnormal; Notable for the following components:      Result Value   WBC 17.6 (*)    Neutro Abs 15.6 (*)    Abs Immature Granulocytes 0.09 (*)    All other components within normal limits  COMPREHENSIVE METABOLIC PANEL WITH GFR - Abnormal; Notable for the following components:   Potassium 3.1 (*)    CO2 21 (*)    Glucose, Bld 100 (*)    All other components within normal limits  TSH - Abnormal; Notable for the following components:   TSH 12.373 (*)    All other components within normal limits  MAGNESIUM  - Abnormal; Notable for the following components:   Magnesium  1.5 (*)    All other components within normal limits  URINALYSIS, W/ REFLEX TO CULTURE  (INFECTION SUSPECTED) - Abnormal; Notable for the following components:   Color, Urine STRAW (*)    APPearance CLEAR (*)    All other components within normal limits  URINE DRUG SCREEN, QUALITATIVE ([facility] ONLY) - Abnormal; Notable for the following components:   Phencyclidine (PCP) Ur S POSITIVE (*)    Cannabinoid 50 Ng, Ur [place] POSITIVE (*)    All other components within normal limits  BLOOD GAS, VENOUS - Abnormal; Notable for the following components:   pCO2, Ven 42 (*)  All other components within normal limits  TROPONIN I (HIGH SENSITIVITY) - Abnormal; Notable for the following components:   Troponin I (High Sensitivity) 21 (*)    All other components within normal limits  TROPONIN I (HIGH SENSITIVITY) - Abnormal; Notable for the following components:   Troponin I (High Sensitivity) 35 (*)    All other components within normal limits  RESP PANEL BY RT-PCR (RSV, FLU A&B, COVID)  RVPGX2  BRAIN NATRIURETIC PEPTIDE  ETHANOL  T4, FREE  PROCALCITONIN  HIV ANTIBODY (ROUTINE TESTING W REFLEX)  LIPID PANEL  HEMOGLOBIN A1C     EKG:  EKG Interpretation Date/Time:  Saturday August 27 2023 01:37:55 EDT Ventricular Rate:  69 PR Interval:  184 QRS Duration:  93 QT Interval:  376 QTC Calculation: 403 R Axis:   -44  Text Interpretation: Sinus arrhythmia Left axis deviation RSR' in V1 or V2, right VCD or RVH Confirmed by Neomi Neptune [phone]) on 08/27/2023 2:09:33 AM         RADIOLOGY: My personal review and interpretation of imaging: X-rays, CTs show no acute abnormality.  I have personally reviewed all radiology reports.   DG Ribs Unilateral Left Result Date: 08/27/2023 CLINICAL DATA:  Fall, left chest pain EXAM: LEFT RIBS - 2 VIEW COMPARISON:  None Available. FINDINGS: No fracture or other bone lesions are seen involving the ribs. IMPRESSION: Negative. Electronically Signed   By: Dorethia Molt M.D.   On: 08/27/2023 01:51   DG Chest Portable 1 View Result Date:  08/27/2023 CLINICAL DATA:  Dyspnea, bradycardia EXAM: PORTABLE CHEST 1 VIEW COMPARISON:  None Available. FINDINGS: Lungs are well expanded, symmetric, and clear. No pneumothorax or pleural effusion. Cardiac size within normal limits. Status post coronary artery bypass grafting. Pulmonary vascularity is normal. Osseous structures are age-appropriate. No acute bone abnormality. IMPRESSION: No active disease. Electronically Signed   By: Dorethia Molt M.D.   On: 08/27/2023 01:50   CT HEAD WO CONTRAST ( ) Result Date: 08/27/2023 CLINICAL DATA:  Head trauma, minor (Age >= 65y) Mental status change, unknown cause; Neck trauma (Age >= 65y) EXAM: CT HEAD WITHOUT CONTRAST CT CERVICAL SPINE WITHOUT CONTRAST TECHNIQUE: Multidetector CT imaging of the head and cervical spine was performed following the standard protocol without intravenous contrast. Multiplanar CT image reconstructions of the cervical spine were also generated. RADIATION DOSE REDUCTION: This exam was performed according to the departmental dose-optimization program which includes automated exposure control, adjustment of the mA and/or kV according to patient size and/or use of iterative reconstruction technique. COMPARISON:  None Available. FINDINGS: CT HEAD FINDINGS Brain: Motion degraded examination. No evidence of acute infarction, hemorrhage, hydrocephalus, extra-axial collection or mass lesion/mass effect. Vascular: No hyperdense vessel or unexpected calcification. Skull: Normal. Negative for fracture or focal lesion. Sinuses/Orbits: No acute finding. Other: Mastoid air cells and middle ear cavities are clear. CT CERVICAL SPINE FINDINGS Alignment: Normal. Skull base and vertebrae: No acute fracture. No primary bone lesion or focal pathologic process. Soft tissues and spinal canal: No prevertebral fluid or swelling. No visible canal hematoma. Disc levels: Disc space narrowing and endplate remodeling throughout the cervical spine, most severe at C4-C7  in keeping with changes of advanced degenerative disc disease. Prevertebral soft tissues are not thickened on sagittal reformats. No high-grade canal stenosis. Uncovertebral arthrosis results in moderate to severe neuroforaminal narrowing on the right at C4-5 and C5-6 and on the left at C6-7. Upper chest: Emphysema Other: None IMPRESSION: 1. Motion degraded examination without acute intracranial abnormality. 2. No acute fracture or  subluxation of the cervical spine. 3. Multilevel degenerative disc disease and facet arthropathy of the cervical spine. Electronically Signed   By: Dorethia Molt M.D.   On: 08/27/2023 01:49   CT Cervical Spine Wo Contrast Result Date: 08/27/2023 CLINICAL DATA:  Head trauma, minor (Age >= 65y) Mental status change, unknown cause; Neck trauma (Age >= 65y) EXAM: CT HEAD WITHOUT CONTRAST CT CERVICAL SPINE WITHOUT CONTRAST TECHNIQUE: Multidetector CT imaging of the head and cervical spine was performed following the standard protocol without intravenous contrast. Multiplanar CT image reconstructions of the cervical spine were also generated. RADIATION DOSE REDUCTION: This exam was performed according to the departmental dose-optimization program which includes automated exposure control, adjustment of the mA and/or kV according to patient size and/or use of iterative reconstruction technique. COMPARISON:  None Available. FINDINGS: CT HEAD FINDINGS Brain: Motion degraded examination. No evidence of acute infarction, hemorrhage, hydrocephalus, extra-axial collection or mass lesion/mass effect. Vascular: No hyperdense vessel or unexpected calcification. Skull: Normal. Negative for fracture or focal lesion. Sinuses/Orbits: No acute finding. Other: Mastoid air cells and middle ear cavities are clear. CT CERVICAL SPINE FINDINGS Alignment: Normal. Skull base and vertebrae: No acute fracture. No primary bone lesion or focal pathologic process. Soft tissues and spinal canal: No prevertebral fluid  or swelling. No visible canal hematoma. Disc levels: Disc space narrowing and endplate remodeling throughout the cervical spine, most severe at C4-C7 in keeping with changes of advanced degenerative disc disease. Prevertebral soft tissues are not thickened on sagittal reformats. No high-grade canal stenosis. Uncovertebral arthrosis results in moderate to severe neuroforaminal narrowing on the right at C4-5 and C5-6 and on the left at C6-7. Upper chest: Emphysema Other: None IMPRESSION: 1. Motion degraded examination without acute intracranial abnormality. 2. No acute fracture or subluxation of the cervical spine. 3. Multilevel degenerative disc disease and facet arthropathy of the cervical spine. Electronically Signed   By: Dorethia Molt M.D.   On: 08/27/2023 01:49     PROCEDURES:  Critical Care performed: Yes, see critical care procedure note(s)   CRITICAL CARE Performed by: Josette Nyjae Hodge   Total critical care time: 30 minutes  Critical care time was exclusive of separately billable procedures and treating other patients.  Critical care was necessary to treat or prevent imminent or life-threatening deterioration.  Critical care was time spent personally by me on the following activities: development of treatment plan with patient and/or surrogate as well as nursing, discussions with consultants, evaluation of patient's response to treatment, examination of patient, obtaining history from patient or surrogate, ordering and performing treatments and interventions, ordering and review of laboratory studies, ordering and review of radiographic studies, pulse oximetry and re-evaluation of patient's condition.   SABRA1-3 Lead EKG Interpretation  Performed by: Tahirih Lair, Josette SAILOR, DO Authorized by: Hillarie Harrigan, Josette SAILOR, DO     Interpretation: normal     ECG rate:  69   ECG rate assessment: normal     Rhythm: sinus rhythm     Ectopy: none     Conduction: normal       IMPRESSION / MDM / ASSESSMENT AND  PLAN / ED COURSE  I reviewed the triage vital signs and the nursing notes.    Patient here with complaints of chest pain, shortness of breath, bradycardia.  Heart rate currently in the 60s to 70s and a sinus rhythm.  The patient is on the cardiac monitor to evaluate for evidence of arrhythmia and/or significant heart rate changes.   DIFFERENTIAL DIAGNOSIS (includes but not limited  to):   ACS, PE, pneumonia, CHF, COPD, dissection, anemia, electrolyte derangement, thyroid dysfunction, UTI   Patient's presentation is most consistent with acute presentation with potential threat to life or bodily function.   PLAN: Will obtain cardiac labs, chest x-ray.  Will also obtain x-ray of the left ribs, CT head and cervical spine given reports of fall with somnolence here although he denies head injury.  Will also check blood sugar, VBG given somnolent here as well as ethanol level, drug screen.  Patient high risk for ACS.  History of CABG.  EKG shows no new ischemic change.  PE is on the differential given recent travel from [place]  but he is not tachycardic, tachypneic or hypoxic and currently denies any new chest pain or shortness of breath at this time.  Will continue to monitor.  MEDICATIONS GIVEN IN ED: Medications  aspirin  EC tablet 81 mg (has no administration in time range)  atorvastatin  (LIPITOR) tablet 40 mg (has no administration in time range)  levothyroxine  (SYNTHROID ) tablet 150 mcg (150 mcg Oral Given 08/27/23 0624)  nitroGLYCERIN  (NITROSTAT ) SL tablet 0.4 mg (has no administration in time range)  acetaminophen  (TYLENOL ) tablet 650 mg (has no administration in time range)  ondansetron  (ZOFRAN ) injection 4 mg (has no administration in time range)  enoxaparin  (LOVENOX ) injection 40 mg (40 mg Subcutaneous Given 08/27/23 0822)  melatonin tablet 5 mg (has no administration in time range)  polyethylene glycol (MIRALAX / GLYCOLAX) packet 17 g (has no administration in time range)   pantoprazole  (PROTONIX ) EC tablet 40 mg (has no administration in time range)  0.9 %  sodium chloride  infusion (has no administration in time range)  potassium chloride  SA (KLOR-CON  M) CR tablet 40 mEq (40 mEq Oral Given 08/27/23 0419)  magnesium  sulfate IVPB 2 g 50 mL (0 g Intravenous Stopped 08/27/23 0606)     ED COURSE: Patient does have a leukocytosis of 17,000.  He denies any infectious symptoms.  Procalcitonin negative.  Chest x-ray reviewed and interpreted by myself and the radiologist and shows no infiltrate, edema.  Urine shows no sign of infection.  This could be reactive in nature.  COVID, flu and RSV negative.  First troponin slightly elevated at 21 and rising down to 35.  He is not having any current chest pain or shortness of breath.  EKG shows atrial flutter.  Will give full dose aspirin .  Will hold heparin  at this time given just modest elevation of his troponins but have recommended admission given his cardiac history.  BNP is normal.  He does not appear volume overloaded.  Urine drug screen positive for PCP.  Patient adamant that he has not used any illicit drugs.  There are other over-the-counter medications such as Benadryl that could cause this to be false positive.  Mild hypokalemia, hypomagnesemia.  Replacement ordered.  TSH elevated but normal free T4.  VBG shows no hypercarbia to explain his somnolence.    CONSULTS:  Consulted and discussed patient's case with hospitalist, Dr. Cleatus.  I have recommended admission and consulting physician agrees and will place admission orders.  Patient (and family if present) agree with this plan.   I reviewed all nursing notes, vitals, pertinent previous records.  All labs, EKGs, imaging ordered have been independently reviewed and interpreted by myself.    OUTSIDE RECORDS REVIEWED: Reviewed previous neurology note in May 2022 when patient was seen for tremor.  Patient also previously followed by Dr. Hester with cardiology in  2021.  FINAL CLINICAL IMPRESSION(S) / ED DIAGNOSES   Final diagnoses:  Shortness of breath  Fall, initial encounter  Rib pain on left side     Rx / DC Orders   ED Discharge Orders     None        Note:  This document was prepared using Dragon voice recognition software and may include unintentional dictation errors.   Denea Cheaney, Josette SAILOR, DO 08/27/23 [phone]

## 2023-08-27 NOTE — ED Notes (Signed)
 Pt reports burning at IV site.  Swelling noted to left lower arm and hand.  IV fluids stopped.  Will place a new IV.

## 2023-08-27 NOTE — Consult Note (Signed)
 Cardiology Consultation   Patient ID: Bryce Haney MRN: [id number]; DOB: May 29, 1950  Admit date: 08/27/2023 Date of Consult: 08/27/2023  PCP:  Freddrick, No   [facility] Providers Cardiologist:  None      New consult completed by Dr Perla  Patient Profile: Bryce Haney is a 73 y.o. male with a hx of coronary artery disease status post CABG, paroxysmal atrial fibrillation hypothyroidism, GERD, tremors, hypertension, hyperlipidemia, CHF, COPD, degenerative disc disease, gout, depression, and chronic pain syndrome who is being seen 08/27/2023 for the evaluation of chest pain at the request of K Foust.  History of Present Illness: Bryce Haney had previously been followed by Dr. Hester.  Was last seen in clinic 09/28/2019 with stable dyspnea and stable angina.  He did have complaints of intermittent claudication and underwent ABIs.  He was encouraged to abstain from all tobacco products, start a regular walking regimen for reduction of symptoms and continue with aggressive medical treatment of risk factors of hypertension, hyperlipidemia and/or diabetes.  Patient was initially diagnosed with coronary artery disease in 10/2012 where he was found to have a STEMI.  He underwent left heart catheterization and had an occluded LAD status post successful bare-metal stent placement.  Otherwise normal coronaries.  Echocardiogram in 2014 revealed an LVEF of 45%, apical akinesis, moderate LVH.  Repeat echocardiogram in 07/17/2014 revealed normal left ventricular systolic function, normal right ventricular systolic function, moderate valvular regurgitation without valvular stenosis.  He underwent Lexiscan Myoview 07/2014 which showed transient ischemic dilatation of the left ventricle consistent with global myocardial ischemia.  Echocardiogram in 05/2015 revealed normal left ventricular function with moderate LVH, normal LA pressures with normal diastolic function, normal right ventricular systolic  function, valvular regurgitation noted in mitral and tricuspid valves without valvular stenosis.  He underwent coronary artery bypass grafting 05/2015 with LIMA to the LAD and saphenous to the D1.  Patient presented to the [facility] emergency department stating that he was having increased chest discomfort described as a tightness with associated dyspnea that started the night before.  He stated he had recently returned to [place]  from [place]  approximately half weeks ago.  He also reported that he had a recent fall with point of impact with his left  of rib cage on a metal toolbox.  He reported head strike but was not on blood thinning medication.  He complains of tremors and elevations in his heart rate and drops in his heart rate likely associated to his tremors.  He endorses being fatigued since February have an episodes where he blacks out and has intermittent falls.  Initial vital signs revealed blood pressure of 131/85, heart rate of 80, respirations of 20, oxygen saturations are 96% on room air.  CT of the head/C-spine obtained was negative for any acute intracranial or cervical spine abnormality.  Chest x-ray was negative for any acute cardiopulmonary disease.  Pertinent labs were WBCs of 17.6, potassium 3.1, high-sensitivity troponin of 21 and 35, TSH is 12.373, magnesium  1.5, UDS was positive for THC and PCP.  He was treated with 2 g of IV magnesium , 40 mEq of potassium chloride , and 325 mg of aspirin .  Cardiology was consulted to assist with evaluation of his chest discomfort.  Past Medical History:  Diagnosis Date   Atrial fibrillation (HCC)    Back pain    CHF (congestive heart failure) (HCC)    COPD (chronic obstructive pulmonary disease) (HCC)    Hyperlipidemia    Myocardial infarction (HCC)    S/P CABG (coronary  artery bypass graft)    Thyroid disease     Past Surgical History:  Procedure Laterality Date   CARDIAC CATHETERIZATION N/A 07/24/2014   Procedure: Left Heart Cath;   Surgeon: Wolm JINNY Rhyme, MD;  Location: [facility] INVASIVE CV LAB;  Service: Cardiovascular;  Laterality: N/A;   COLONOSCOPY WITH PROPOFOL  N/A 12/30/2016   Procedure: COLONOSCOPY WITH PROPOFOL ;  Surgeon: Unk Corinn Skiff, MD;  Location: [facility] ENDOSCOPY;  Service: Gastroenterology;  Laterality: N/A;   CORONARY ANGIOPLASTY     CORONARY ANGIOPLASTY WITH STENT PLACEMENT     CORONARY ARTERY BYPASS GRAFT     JOINT REPLACEMENT  2010   right knee replacement    JOINT REPLACEMENT  2016   left knee replacement   SHOULDER SURGERY Right      Home Medications:  Prior to Admission medications   Medication Sig Start Date End Date Taking? Authorizing Provider  aspirin  81 MG tablet Take 81 mg by mouth daily.   Yes [provider]  atorvastatin  (LIPITOR) 40 MG tablet Take 40 mg by mouth daily.   Yes [provider]  busPIRone  (BUSPAR ) 15 MG tablet Take 15 mg by mouth 2 (two) times daily. 07/11/23  Yes [provider]  gabapentin  (NEURONTIN ) 300 MG capsule Take 300 mg by mouth at bedtime.    Yes [provider]  levothyroxine  (SYNTHROID ) 125 MCG tablet Take 125 mcg by mouth daily. 04/11/23  Yes [provider]  methocarbamol  (ROBAXIN ) 750 MG tablet Take 750 mg by mouth 3 (three) times daily. 08/08/19  Yes [provider]  omeprazole (PRILOSEC) 20 MG capsule Take 20 mg by mouth daily.   Yes [provider]  levothyroxine  (SYNTHROID , LEVOTHROID) 150 MCG tablet Take 150 mcg by mouth daily before breakfast. Patient not taking: Reported on 08/27/2023    [provider]  losartan  (COZAAR ) 25 MG tablet Take 25 mg by mouth daily. Patient not taking: Reported on 08/27/2023 08/08/19   [provider]  mirtazapine  (REMERON  SOL-TAB) 30 MG disintegrating tablet Take 30 mg by mouth at bedtime. Patient not taking: Reported on 08/27/2023 07/04/19   [provider]    Scheduled Meds:  aspirin  EC  81 mg Oral Daily   atorvastatin   40 mg Oral  Daily   busPIRone   15 mg Oral BID   enoxaparin  (LOVENOX ) injection  40 mg Subcutaneous Q24H   gabapentin   300 mg Oral QHS   levothyroxine   150 mcg Oral Q0600   lidocaine   1 patch Transdermal Q24H   losartan   25 mg Oral Daily   pantoprazole   40 mg Oral Daily   Continuous Infusions:  sodium chloride  75 mL/hr at 08/27/23 0946   PRN Meds: acetaminophen , ketorolac , melatonin, nitroGLYCERIN , ondansetron  (ZOFRAN ) IV, polyethylene glycol  Allergies:    Allergies  Allergen Reactions   Morphine And Codeine Hives    Social History:   Social History   Socioeconomic History   Marital status: Single    Spouse name: Not on file   Number of children: Not on file   Years of education: Not on file   Highest education level: Not on file  Occupational History   Not on file  Tobacco Use   Smoking status: Every Day    Current packs/day: 1.00    Average packs/day: 1 pack/day for 35.0 years (35.0 ttl pk-yrs)    Types: Cigarettes   Smokeless tobacco: Never  Vaping Use   Vaping status: Never Used  Substance and Sexual Activity   Alcohol use: No  Alcohol/week: 0.0 standard drinks of alcohol   Drug use: No   Sexual activity: Not on file  Other Topics Concern   Not on file  Social History Narrative   Not on file   Social Drivers of Health   Financial Resource Strain: Low Risk  (10/12/2017)   Overall Financial Resource Strain (CARDIA)    Difficulty of Paying Living Expenses: Not hard at all  Food Insecurity: Unknown (10/12/2017)   Hunger Vital Sign    Worried About Running Out of Food in the Last Year: Patient declined    Ran Out of Food in the Last Year: Patient declined  Transportation Needs: Unknown (10/12/2017)   PRAPARE - Administrator, Civil Service (Medical): Patient declined    Lack of Transportation (Non-Medical): Patient declined  Physical Activity: Unknown (10/12/2017)   Exercise Vital Sign    Days of Exercise per Week: Patient declined    Minutes of Exercise per  Session: Patient declined  Stress: No Stress Concern Present (10/12/2017)   [employer] of Occupational Health - Occupational Stress Questionnaire    Feeling of Stress : Not at all  Social Connections: Unknown (10/12/2017)   Social Connection and Isolation Panel    Frequency of Communication with Friends and Family: Patient declined    Frequency of Social Gatherings with Friends and Family: Patient declined    Attends Religious Services: Patient declined    Database administrator or Organizations: Patient declined    Attends Banker Meetings: Patient declined    Marital Status: Patient declined  Intimate Partner Violence: Unknown (10/12/2017)   Humiliation, Afraid, Rape, and Kick questionnaire    Fear of Current or Ex-Partner: Patient declined    Emotionally Abused: Patient declined    Physically Abused: Patient declined    Sexually Abused: Patient declined    Family History:    Family History  Problem Relation Age of Onset   Heart disease Mother    Heart disease Father      ROS:  Please see the history of present illness.  Review of Systems  Constitutional:  Positive for diaphoresis and malaise/fatigue.  HENT:  Positive for hearing loss.   Cardiovascular:  Positive for chest pain and palpitations.  Neurological:  Positive for weakness.  Psychiatric/Behavioral:  The patient is nervous/anxious.     All other ROS reviewed and negative.     Physical Exam/Data: Vitals:   08/27/23 0245 08/27/23 0500 08/27/23 0905 08/27/23 1130  BP: (!) 140/68  (!) 140/65 134/74  Pulse: 74  87 61  Resp: 19  20 (!) 21  Temp:  98.7 F (37.1 C) 98.1 F (36.7 C)   TempSrc:  Oral Oral   SpO2: 100%  98% 99%  Weight:      Height:       No intake or output data in the 24 hours ending 08/27/23 1216    08/27/2023   12:31 AM 08/19/2019    9:09 AM 04/20/2018   10:58 AM  Last 3 Weights  Weight (lbs) 214 lb 15.2 oz 215 lb 224 lb  Weight (kg) 97.5 kg 97.523 kg 101.606 kg     Body  mass index is 29.15 kg/m.  General:  Well nourished, well developed, in no acute distress HEENT: normal Neck: no JVD Vascular: No carotid bruits; Distal pulses 2+ bilaterally Cardiac:  normal S1, S2; RRR; no murmur  Lungs:  clear with diminished bases to auscultation bilaterally, no wheezing, rhonchi or rales, respirations are unlabored at rest  on room air Abd: soft, nontender, no hepatomegaly  Ext: Trace pretibial edema Musculoskeletal:  No deformities, BUE and BLE strength normal and equal Skin: warm and dry  Neuro:  CNs 2-12 intact, no focal abnormalities noted Psych:  Normal affect   EKG:  The EKG was personally reviewed and demonstrates: Sinus arrhythmia with a rate of 69, left axis deviation, and RSR prime in V1 and V2 Telemetry:  Telemetry was personally reviewed and demonstrates: Sinus rhythm   Relevant CV Studies:  2D echo 02/05/2019 INTERPRETATION  NORMAL LEFT VENTRICULAR SYSTOLIC FUNCTION   WITH NO LVH  NORMAL RIGHT VENTRICULAR SYSTOLIC FUNCTION  MILD VALVULAR REGURGITATION (See above)  NO VALVULAR STENOSIS  MILD MR, TR  EF 55-60%   LHC 07/24/2014 Ost LAD to Prox LAD lesion, 30% stenosed. A drug-eluting stent was placed. LM lesion, 30% stenosed. A drug-eluting stent was placed. Ost Cx lesion, 95% stenosed. Ramus lesion, 95% stenosed.   Progressive cc3 angina with previous stent in left main into lad now with stenosis of ostium of lcx and ramus Normal lv fucntion    Plan for consultation for possible PCI  Lexiscan MPI 07/17/2014 Normal Lexus scan infusion EKG without evidence of myocardial ischemia  Transient ischemic dilation of the left ventricle consistent with global  myocardial ischemia   Laboratory Data: High Sensitivity Troponin:   Recent Labs  Lab 08/27/23 0111 08/27/23 0313  TROPONINIHS 21* 35*     Chemistry Recent Labs  Lab 08/27/23 0111  NA 135  K 3.1*  CL 103  CO2 21*  GLUCOSE 100*  BUN 13  CREATININE 0.97  CALCIUM  9.7  MG 1.5*   GFRNONAA >60  ANIONGAP 11    Recent Labs  Lab 08/27/23 0111  PROT 7.1  ALBUMIN 4.4  AST 36  ALT 42  ALKPHOS 52  BILITOT 0.6   Lipids  Recent Labs  Lab 08/27/23 0756  CHOL 109  TRIG 37  HDL 45  LDLCALC 57  CHOLHDL 2.4    Hematology Recent Labs  Lab 08/27/23 0111  WBC 17.6*  RBC 4.84  HGB 15.7  HCT 45.0  MCV 93.0  MCH 32.4  MCHC 34.9  RDW 12.4  PLT 213   Thyroid  Recent Labs  Lab 08/27/23 0111  TSH 12.373*  FREET4 0.96    BNP Recent Labs  Lab 08/27/23 0111  BNP 65.7    DDimer No results for input(s): DDIMER in the last 168 hours.  Radiology/Studies:  DG Ribs Unilateral Left Result Date: 08/27/2023 CLINICAL DATA:  Fall, left chest pain EXAM: LEFT RIBS - 2 VIEW COMPARISON:  None Available. FINDINGS: No fracture or other bone lesions are seen involving the ribs. IMPRESSION: Negative. Electronically Signed   By: Dorethia Molt M.D.   On: 08/27/2023 01:51   DG Chest Portable 1 View Result Date: 08/27/2023 CLINICAL DATA:  Dyspnea, bradycardia EXAM: PORTABLE CHEST 1 VIEW COMPARISON:  None Available. FINDINGS: Lungs are well expanded, symmetric, and clear. No pneumothorax or pleural effusion. Cardiac size within normal limits. Status post coronary artery bypass grafting. Pulmonary vascularity is normal. Osseous structures are age-appropriate. No acute bone abnormality. IMPRESSION: No active disease. Electronically Signed   By: Dorethia Molt M.D.   On: 08/27/2023 01:50   CT HEAD WO CONTRAST ( ) Result Date: 08/27/2023 CLINICAL DATA:  Head trauma, minor (Age >= 65y) Mental status change, unknown cause; Neck trauma (Age >= 65y) EXAM: CT HEAD WITHOUT CONTRAST CT CERVICAL SPINE WITHOUT CONTRAST TECHNIQUE: Multidetector CT imaging of the head and cervical  spine was performed following the standard protocol without intravenous contrast. Multiplanar CT image reconstructions of the cervical spine were also generated. RADIATION DOSE REDUCTION: This exam was performed  according to the departmental dose-optimization program which includes automated exposure control, adjustment of the mA and/or kV according to patient size and/or use of iterative reconstruction technique. COMPARISON:  None Available. FINDINGS: CT HEAD FINDINGS Brain: Motion degraded examination. No evidence of acute infarction, hemorrhage, hydrocephalus, extra-axial collection or mass lesion/mass effect. Vascular: No hyperdense vessel or unexpected calcification. Skull: Normal. Negative for fracture or focal lesion. Sinuses/Orbits: No acute finding. Other: Mastoid air cells and middle ear cavities are clear. CT CERVICAL SPINE FINDINGS Alignment: Normal. Skull base and vertebrae: No acute fracture. No primary bone lesion or focal pathologic process. Soft tissues and spinal canal: No prevertebral fluid or swelling. No visible canal hematoma. Disc levels: Disc space narrowing and endplate remodeling throughout the cervical spine, most severe at C4-C7 in keeping with changes of advanced degenerative disc disease. Prevertebral soft tissues are not thickened on sagittal reformats. No high-grade canal stenosis. Uncovertebral arthrosis results in moderate to severe neuroforaminal narrowing on the right at C4-5 and C5-6 and on the left at C6-7. Upper chest: Emphysema Other: None IMPRESSION: 1. Motion degraded examination without acute intracranial abnormality. 2. No acute fracture or subluxation of the cervical spine. 3. Multilevel degenerative disc disease and facet arthropathy of the cervical spine. Electronically Signed   By: Dorethia Molt M.D.   On: 08/27/2023 01:49   CT Cervical Spine Wo Contrast Result Date: 08/27/2023 CLINICAL DATA:  Head trauma, minor (Age >= 65y) Mental status change, unknown cause; Neck trauma (Age >= 65y) EXAM: CT HEAD WITHOUT CONTRAST CT CERVICAL SPINE WITHOUT CONTRAST TECHNIQUE: Multidetector CT imaging of the head and cervical spine was performed following the standard protocol without  intravenous contrast. Multiplanar CT image reconstructions of the cervical spine were also generated. RADIATION DOSE REDUCTION: This exam was performed according to the departmental dose-optimization program which includes automated exposure control, adjustment of the mA and/or kV according to patient size and/or use of iterative reconstruction technique. COMPARISON:  None Available. FINDINGS: CT HEAD FINDINGS Brain: Motion degraded examination. No evidence of acute infarction, hemorrhage, hydrocephalus, extra-axial collection or mass lesion/mass effect. Vascular: No hyperdense vessel or unexpected calcification. Skull: Normal. Negative for fracture or focal lesion. Sinuses/Orbits: No acute finding. Other: Mastoid air cells and middle ear cavities are clear. CT CERVICAL SPINE FINDINGS Alignment: Normal. Skull base and vertebrae: No acute fracture. No primary bone lesion or focal pathologic process. Soft tissues and spinal canal: No prevertebral fluid or swelling. No visible canal hematoma. Disc levels: Disc space narrowing and endplate remodeling throughout the cervical spine, most severe at C4-C7 in keeping with changes of advanced degenerative disc disease. Prevertebral soft tissues are not thickened on sagittal reformats. No high-grade canal stenosis. Uncovertebral arthrosis results in moderate to severe neuroforaminal narrowing on the right at C4-5 and C5-6 and on the left at C6-7. Upper chest: Emphysema Other: None IMPRESSION: 1. Motion degraded examination without acute intracranial abnormality. 2. No acute fracture or subluxation of the cervical spine. 3. Multilevel degenerative disc disease and facet arthropathy of the cervical spine. Electronically Signed   By: Dorethia Molt M.D.   On: 08/27/2023 01:49     Assessment and Plan: Coronary artery disease with atypical chest pain and elevated high-sensitivity troponins -Patient presented with 5 out of 10 right chest pain described as an elephant sitting  on his chest with tightness and discomfort that  worsened last night -EKG is nonacute -High-sensitivity troponins trended flat not consistent with ACS -Continued on aspirin  and statin therapy -Continue with as needed Nitrostat  for pain and discomfort -Patient sustained a fall onto a metal box prior to onset of symptoms -No indication for heparin  infusion -No further ischemic testing at this time -Echocardiogram ordered and pending further recommendations to follow -If ongoing symptoms can consider outpatient ischemic workup  Syncope -Patient reported having several blackout episodes - Imaging has been unrevealing -Continue with cardiac monitoring -Troponins have trended flat not consistent with ACS -Can consider placing on an event monitor prior to discharge to rule out any arrhythmias -According to [place]  state law patient is not to drive for 6 months after syncopal episode or until the cause is found and adequately treated  Hypokalemia -Serum potassium 3.1 -Potassium supplements given -Recommend keeping potassium greater than 4 less than 5 -Monitor/trend/replete electrolytes -Daily BMP  Primary hypertension -Blood pressure 134/74 -Continue on losartan  25 mg daily -Vital signs per unit protocol  Mixed hyperlipidemia -Continue on atorvastatin  -LDL 57  Postoperative atrial fibrillation -Currently maintaining sinus rhythm -Not on oral anticoagulation, previously was on Coumadin  therapy that was discontinued - Patient has not had recurrence of atrial fibrillation and previously worn a heart monitor revealed sinus rhythm without any further arrhythmia noted  COPD -Not in acute exacerbation -Continue home bronchodilators -As needed nebulizers  Hypothyroidism -TSH elevated - Continued on Synthroid  - Ongoing management per IM   Risk Assessment/Risk Scores:    TIMI Risk Score for Unstable Angina or Non-ST Elevation MI:   The patient's TIMI risk score is 5, which  indicates a 26% risk of all cause mortality, new or recurrent myocardial infarction or need for urgent revascularization in the next 14 days.         For questions or updates, please contact [facility] Please consult [email or web address] for contact info under    Signed, Horris Speros, NP  08/27/2023 12:16 PM

## 2023-08-27 NOTE — Progress Notes (Signed)
*  PRELIMINARY RESULTS* Echocardiogram 2D Echocardiogram has been performed.  Bari BROCKS Anes Rigel 08/27/2023, 2:58 PM

## 2023-08-27 NOTE — Plan of Care (Signed)

## 2023-08-27 NOTE — ED Triage Notes (Signed)
 BIBA with complaints of low heart rate, not feeling good and breathing issues.  Roommate called EMS bc Pt yelled help me.  Pt states he is always in pain and there is no new pain.  He says his heart rate went down in the low 40s. He said he can't keep awake.  Noted he falls asleep while waiting on questions to be asked.   Per EMS:  12 lead NSR Temp 97.6 106 BS 148/71 95% RA

## 2023-08-27 NOTE — H&P (Signed)
 History and Physical    Bryce Haney FMW:[id number] DOB: 03-21-1950 DOA: 08/27/2023  DOS: the patient was seen and examined on 08/27/2023  PCP: Pcp, No   Patient coming from: Home  I have personally briefly reviewed patient's old medical records in [facility] and CareEverywhere  HPI:   Bryce Haney is a 73 y.o. year old male with past medical history of hypothyroidism, paroxysmal A-fib not on blood thinners, coronary artery disease status post CABG with LIMA to LAD and SVG to D1, GERD, tremors, hypertension hyperlipidemia, CHF, COPD, degenerative disc disease, gout, depression, and chronic pain syndrome.  He presents to [facility] ED with reports of increased chest discomfort described as tightness with associated dyspnea that started last night.  In the ED it was noted that he was somnolent but arousable to voice.  On my interview he was alert and able to stay awake throughout the conversation without falling asleep however as I was leaving the room noted patient to have fallen asleep prior to me exiting the room.  Of note he recently returned to [place]  from [place]  approximately 1.5 weeks ago.  There is no calf tenderness or swelling on exam. He also reports that he had a fall recently and point of impact was his left rib cage on a table.  No reported head strike and he is not on blood thinners.  At bedside this morning Mr. [name] describes his chest pain as feeling as though and elephant is sitting on his chest and tightness with associated dyspnea.  This morning he rates the pain 1 out of 10.  He endorses being fatigued since February, having episodes where he blacks out, and intermittent falls.  These episodes where he blacks out are not preceded by lightheadedness, dizziness, or change in sensorium.  He denies palpitations, vomiting.  He does endorse nausea preceding these events.  ED Course: On arrival to [facility] regional ED ED patient was noted to be  afebrile temp 36.9 C, BP 131/85, HR 80, RR 20, SpO2 96% on room air.  CT head/C-spine obtained and negative for any acute intracranial or cervical spine abnormality.  Did show degenerative disc disease of C-spine.  CXR obtained and negative for any acute cardiopulmonary disease.  Plain film of left ribs obtained and negative for any fractures.  Labs notable for leukocytosis with WBC 17.6, potassium 3.1,Troponin 21-->35, TSH 12.373, magnesium  1.5, UDS positive for THC and PCP.  He was given 2 g IV magnesium , 40 mEq p.o. K, and 325 mg aspirin  with EMS.  TRH contacted for admission.  Review of Systems: As mentioned in the history of present illness. All other systems reviewed and are negative.   Past Medical History:  Diagnosis Date   Atrial fibrillation (HCC)    Back pain    CHF (congestive heart failure) (HCC)    COPD (chronic obstructive pulmonary disease) (HCC)    Hyperlipidemia    Myocardial infarction (HCC)    S/P CABG (coronary artery bypass graft)    Thyroid disease     Past Surgical History:  Procedure Laterality Date   CARDIAC CATHETERIZATION N/A 07/24/2014   Procedure: Left Heart Cath;  Surgeon: Wolm JINNY Rhyme, MD;  Location: [facility] INVASIVE CV LAB;  Service: Cardiovascular;  Laterality: N/A;   COLONOSCOPY WITH PROPOFOL  N/A 12/30/2016   Procedure: COLONOSCOPY WITH PROPOFOL ;  Surgeon: Unk Corinn Skiff, MD;  Location: [facility] ENDOSCOPY;  Service: Gastroenterology;  Laterality: N/A;   CORONARY ANGIOPLASTY     CORONARY ANGIOPLASTY WITH  STENT PLACEMENT     CORONARY ARTERY BYPASS GRAFT     JOINT REPLACEMENT  2010   right knee replacement    JOINT REPLACEMENT  2016   left knee replacement   SHOULDER SURGERY Right      reports that he has been smoking. He has a 35 pack-year smoking history. He has never used smokeless tobacco. He reports that he does not drink alcohol and does not use drugs.  Allergies  Allergen Reactions   Morphine And Codeine Hives    Family History   Problem Relation Age of Onset   Heart disease Mother    Heart disease Father     Prior to Admission medications   Medication Sig Start Date End Date Taking? Authorizing Provider  aspirin  81 MG tablet Take 81 mg by mouth daily.   Yes [provider]  atorvastatin  (LIPITOR) 40 MG tablet Take 40 mg by mouth daily.   Yes [provider]  busPIRone  (BUSPAR ) 15 MG tablet Take 15 mg by mouth 2 (two) times daily. 07/11/23  Yes [provider]  gabapentin  (NEURONTIN ) 300 MG capsule Take 300 mg by mouth at bedtime.    Yes [provider]  levothyroxine  (SYNTHROID ) 125 MCG tablet Take 125 mcg by mouth daily. 04/11/23  Yes [provider]  methocarbamol  (ROBAXIN ) 750 MG tablet Take 750 mg by mouth 3 (three) times daily. 08/08/19  Yes [provider]  omeprazole (PRILOSEC) 20 MG capsule Take 20 mg by mouth daily.   Yes [provider]  levothyroxine  (SYNTHROID , LEVOTHROID) 150 MCG tablet Take 150 mcg by mouth daily before breakfast. Patient not taking: Reported on 08/27/2023    [provider]  losartan  (COZAAR ) 25 MG tablet Take 25 mg by mouth daily. Patient not taking: Reported on 08/27/2023 08/08/19   [provider]  mirtazapine  (REMERON  SOL-TAB) 30 MG disintegrating tablet Take 30 mg by mouth at bedtime. Patient not taking: Reported on 08/27/2023 07/04/19   [provider]    Physical Exam: Vitals:   08/27/23 0031 08/27/23 0042 08/27/23 0245 08/27/23 0500  BP:  (!) 151/85 (!) 140/68   Pulse:  80 74   Resp:  20 19   Temp:  98.5 F (36.9 C)  98.7 F (37.1 C)  TempSrc:  Oral  Oral  SpO2:  96% 100%   Weight: 97.5 kg     Height: 6' (1.829 m)       Physical Exam   Constitutional: Anxious, hard of hearing (wears hearing aids but does not have them in), elderly, chronically ill-appearing, Caucasian gentleman Respiratory: clear to auscultation bilaterally, no wheezing, no crackles. Normal respiratory effort. No  accessory muscle use.  Cardiovascular: Regular rate and rhythm, no murmurs / rubs / gallops. No extremity edema. 2+ radial and pedal pulses.  Abdomen: Soft, nontender. Bowel sounds positive x4 quadrants. (L) rib cage more prominent than (R) Musculoskeletal: no clubbing / cyanosis. No joint deformity upper and lower extremities. Good ROM, no contractures. Normal muscle tone.  Skin: no rashes, lesions, ulcers. No induration Neurologic: Alert and oriented x 3.    Labs on Admission: I have personally reviewed following labs and imaging studies  CBC: Recent Labs  Lab 08/27/23 0111  WBC 17.6*  NEUTROABS 15.6*  HGB 15.7  HCT 45.0  MCV 93.0  PLT 213   Basic Metabolic Panel: Recent Labs  Lab 08/27/23 0111  NA 135  K 3.1*  CL 103  CO2 21*  GLUCOSE 100*  BUN 13  CREATININE  0.97  CALCIUM  9.7  MG 1.5*   GFR: Estimated Creatinine Clearance: 85.8 mL/min (by C-G formula based on SCr of 0.97 mg/dL). Liver Function Tests: Recent Labs  Lab 08/27/23 0111  AST 36  ALT 42  ALKPHOS 52  BILITOT 0.6  PROT 7.1  ALBUMIN 4.4   No results for input(s): LIPASE, AMYLASE in the last 168 hours. No results for input(s): AMMONIA in the last 168 hours. Coagulation Profile: No results for input(s): INR, PROTIME in the last 168 hours. Cardiac Enzymes: Recent Labs  Lab 08/27/23 0111 08/27/23 0313  TROPONINIHS 21* 35*   BNP (last 3 results) Recent Labs    08/27/23 0111  BNP 65.7   HbA1C: No results for input(s): HGBA1C in the last 72 hours. CBG: No results for input(s): GLUCAP in the last 168 hours. Lipid Profile: No results for input(s): CHOL, HDL, LDLCALC, TRIG, CHOLHDL, LDLDIRECT in the last 72 hours. Thyroid Function Tests: Recent Labs    08/27/23 0111  TSH 12.373*  FREET4 0.96   Anemia Panel: No results for input(s): VITAMINB12, FOLATE, FERRITIN, TIBC, IRON, RETICCTPCT in the last 72 hours. Urine analysis:    Component Value  Date/Time   COLORURINE STRAW (A) 08/27/2023 0111   APPEARANCEUR CLEAR (A) 08/27/2023 0111   LABSPEC 1.005 08/27/2023 0111   PHURINE 6.0 08/27/2023 0111   GLUCOSEU NEGATIVE 08/27/2023 0111   HGBUR NEGATIVE 08/27/2023 0111   BILIRUBINUR NEGATIVE 08/27/2023 0111   KETONESUR NEGATIVE 08/27/2023 0111   PROTEINUR NEGATIVE 08/27/2023 0111   NITRITE NEGATIVE 08/27/2023 0111   LEUKOCYTESUR NEGATIVE 08/27/2023 0111    Radiological Exams on Admission: I have personally reviewed images DG Ribs Unilateral Left Result Date: 08/27/2023 CLINICAL DATA:  Fall, left chest pain EXAM: LEFT RIBS - 2 VIEW COMPARISON:  None Available. FINDINGS: No fracture or other bone lesions are seen involving the ribs. IMPRESSION: Negative. Electronically Signed   By: Dorethia Molt M.D.   On: 08/27/2023 01:51   DG Chest Portable 1 View Result Date: 08/27/2023 CLINICAL DATA:  Dyspnea, bradycardia EXAM: PORTABLE CHEST 1 VIEW COMPARISON:  None Available. FINDINGS: Lungs are well expanded, symmetric, and clear. No pneumothorax or pleural effusion. Cardiac size within normal limits. Status post coronary artery bypass grafting. Pulmonary vascularity is normal. Osseous structures are age-appropriate. No acute bone abnormality. IMPRESSION: No active disease. Electronically Signed   By: Dorethia Molt M.D.   On: 08/27/2023 01:50   CT HEAD WO CONTRAST ( ) Result Date: 08/27/2023 CLINICAL DATA:  Head trauma, minor (Age >= 65y) Mental status change, unknown cause; Neck trauma (Age >= 65y) EXAM: CT HEAD WITHOUT CONTRAST CT CERVICAL SPINE WITHOUT CONTRAST TECHNIQUE: Multidetector CT imaging of the head and cervical spine was performed following the standard protocol without intravenous contrast. Multiplanar CT image reconstructions of the cervical spine were also generated. RADIATION DOSE REDUCTION: This exam was performed according to the departmental dose-optimization program which includes automated exposure control, adjustment of the mA  and/or kV according to patient size and/or use of iterative reconstruction technique. COMPARISON:  None Available. FINDINGS: CT HEAD FINDINGS Brain: Motion degraded examination. No evidence of acute infarction, hemorrhage, hydrocephalus, extra-axial collection or mass lesion/mass effect. Vascular: No hyperdense vessel or unexpected calcification. Skull: Normal. Negative for fracture or focal lesion. Sinuses/Orbits: No acute finding. Other: Mastoid air cells and middle ear cavities are clear. CT CERVICAL SPINE FINDINGS Alignment: Normal. Skull base and vertebrae: No acute fracture. No primary bone lesion or focal pathologic process. Soft tissues and spinal canal: No prevertebral fluid or  swelling. No visible canal hematoma. Disc levels: Disc space narrowing and endplate remodeling throughout the cervical spine, most severe at C4-C7 in keeping with changes of advanced degenerative disc disease. Prevertebral soft tissues are not thickened on sagittal reformats. No high-grade canal stenosis. Uncovertebral arthrosis results in moderate to severe neuroforaminal narrowing on the right at C4-5 and C5-6 and on the left at C6-7. Upper chest: Emphysema Other: None IMPRESSION: 1. Motion degraded examination without acute intracranial abnormality. 2. No acute fracture or subluxation of the cervical spine. 3. Multilevel degenerative disc disease and facet arthropathy of the cervical spine. Electronically Signed   By: Dorethia Molt M.D.   On: 08/27/2023 01:49   CT Cervical Spine Wo Contrast Result Date: 08/27/2023 CLINICAL DATA:  Head trauma, minor (Age >= 65y) Mental status change, unknown cause; Neck trauma (Age >= 65y) EXAM: CT HEAD WITHOUT CONTRAST CT CERVICAL SPINE WITHOUT CONTRAST TECHNIQUE: Multidetector CT imaging of the head and cervical spine was performed following the standard protocol without intravenous contrast. Multiplanar CT image reconstructions of the cervical spine were also generated. RADIATION DOSE  REDUCTION: This exam was performed according to the departmental dose-optimization program which includes automated exposure control, adjustment of the mA and/or kV according to patient size and/or use of iterative reconstruction technique. COMPARISON:  None Available. FINDINGS: CT HEAD FINDINGS Brain: Motion degraded examination. No evidence of acute infarction, hemorrhage, hydrocephalus, extra-axial collection or mass lesion/mass effect. Vascular: No hyperdense vessel or unexpected calcification. Skull: Normal. Negative for fracture or focal lesion. Sinuses/Orbits: No acute finding. Other: Mastoid air cells and middle ear cavities are clear. CT CERVICAL SPINE FINDINGS Alignment: Normal. Skull base and vertebrae: No acute fracture. No primary bone lesion or focal pathologic process. Soft tissues and spinal canal: No prevertebral fluid or swelling. No visible canal hematoma. Disc levels: Disc space narrowing and endplate remodeling throughout the cervical spine, most severe at C4-C7 in keeping with changes of advanced degenerative disc disease. Prevertebral soft tissues are not thickened on sagittal reformats. No high-grade canal stenosis. Uncovertebral arthrosis results in moderate to severe neuroforaminal narrowing on the right at C4-5 and C5-6 and on the left at C6-7. Upper chest: Emphysema Other: None IMPRESSION: 1. Motion degraded examination without acute intracranial abnormality. 2. No acute fracture or subluxation of the cervical spine. 3. Multilevel degenerative disc disease and facet arthropathy of the cervical spine. Electronically Signed   By: Dorethia Molt M.D.   On: 08/27/2023 01:49    EKG: My personal interpretation of EKG shows: Normal sinus rhythm, HR 69 with bifascicular block    Assessment/Plan Principal Problem:   Chest pain    ## Chest pain 5/10 right chest pain chest pain described as elephant sitting on his chest, tightness, and discomfort that worsened last night.  Patient  reports he typically has chest discomfort however that discomfort increased last night prompting him to present to the ED. Endorses dyspnea, fatigue, nausea, and radiation of the pain from his left chest to his left armpit. - EKG - Troponin, TSH, Lipid Panel, BNP, A1C - PRN Nitroglycerin  - IV Morphine (Toradol , Robaxin , Lidocaine  patch for reproducible pain) - s/p 325 mg of Aspirin , continue daily baby aspirin  - Cardiology consulted appreciate their recommendations and management  #Syncope Events: Patient reports feeling fatigue since February reports he has episodes where he blacks out. Denies dizziness, lightheadedness, changes in sensorium preceding these blackouts.  Denies palpitations, vomiting.  Does have nausea.  Also having recurrent falls at home. Neuro findings: No focal deficits noted on exam -  Orthostatic vital signs  - ECHO - Cardiac Monitoring - Check Troponin - CT Head - Neuro checks - IVF: NS 75 mL/hr - PT/OT eval and treat  ## Hypokalemia ## Hypomagnesemia - Received 2 g IV magnesium  and 40 mEq p.o. potassium in ED - Replete as needed - Repeat with a.m. labs  #Hypertension SBP 150s in ED, patient reports he has not taken his home losartan  in the last month -Restart losartan   #Coronary artery disease #Hyperlipidemia - Continue home Lipitor, aspirin   #Paroxysmal A-fib Not on blood thinners or rate control medication outpatient  #COPD - Continue home bronchodilator - As needed nebulizers while inpatient  #Hypothyroidism TSH elevated, with normal FT4 - Home Synthroid  dose increased from 125 mcg daily to 150 mcg daily - Outpatient endocrine follow-up  #GERD - Protonix   VTE prophylaxis:  Lovenox   GI prophylaxis: Protonix  Diet: Heart healthy Access: PIV Lines: None Code Status: Full Telemetry: Yes Disposition: Observe on telemetry  Admission status: Observation, progressive Patient is from: Home Anticipated d/c is to: Home Anticipated d/c date is:  Tomorrow Patient currently: Pending cardiology consultation  Family Communication: Patient declined to provide information for family/friend to be updated  Consults called: [facility] cardiology   Severity of Illness: The appropriate patient status for this patient is OBSERVATION. Observation status is judged to be reasonable and necessary in order to provide the required intensity of service to ensure the patient's safety. The patient's presenting symptoms, physical exam findings, and initial radiographic and laboratory data in the context of their medical condition is felt to place them at decreased risk for further clinical deterioration. Furthermore, it is anticipated that the patient will be medically stable for discharge from the hospital within 2 midnights of admission.   To reach the provider On-Call:   7AM- 7PM see care teams to locate the attending and reach out to them via [email or web address] Password: [email or web address] 7PM-7AM contact night-coverage If you still have difficulty reaching the appropriate provider, please page the [facility] (Director on Call) for [facility] on amion for assistance  This document was prepared using Conservation officer, historic buildings and may include unintentional dictation errors.  Rockie Rams FNP-BC, PMHNP-BC Nurse Practitioner [facility]

## 2023-08-27 NOTE — Evaluation (Signed)
 Physical Therapy Evaluation Patient Details Name: Clement Deneault MRN: [id number] DOB: 06-17-1950 Today's Date: 08/27/2023  History of Present Illness  Pt is a 73 yo male that presented to the ED for increased chest discomfort, also a recent fall at home. PMH of afib, back pain, CHF, COPD, HLD, MI s/p CABG, gout, depression, chronic pain syndrome, tremors.  Clinical Impression  Patient wakes to voice, alert, oriented to self, place, situation. Initially not agreeable to PT/OT evaluation due to fatigue and weakness, declining any OOB and walking activity. With education and encouragement, pt agreeable to orthostatic vitals assessment and minimal ambulation in room. Limited by pt fatigue and lines/leads (IV attached to bed). Pt mobilized at a supervision-CGA level, but did not ambulate >30ft, would benefit from further assessment. Orthostatic vitals also negative and HR WFLs with mobility, RN notified.  Overall the patient demonstrated deficits (see PT Problem List) that impede the patient's functional abilities, safety, and mobility and would benefit from skilled PT intervention.          If plan is discharge home, recommend the following: A little help with walking and/or transfers;A little help with bathing/dressing/bathroom;Assistance with cooking/housework;Assist for transportation;Help with stairs or ramp for entrance   Can travel by private vehicle        Equipment Recommendations Rolling walker (2 wheels)  Recommendations for Other Services       Functional Status Assessment Patient has had a recent decline in their functional status and demonstrates the ability to make significant improvements in function in a reasonable and predictable amount of time.     Precautions / Restrictions Precautions Precautions: Fall Recall of Precautions/Restrictions: Intact Restrictions Weight Bearing Restrictions Per Provider Order: No      Mobility  Bed Mobility Overal bed mobility: Needs  Assistance Bed Mobility: Supine to Sit, Sit to Supine     Supine to sit: Supervision, Used rails, HOB elevated Sit to supine: Supervision        Transfers Overall transfer level: Needs assistance Equipment used: 1 person hand held assist Transfers: Sit to/from Stand Sit to Stand: +2 safety/equipment           General transfer comment: steadying assist offered, pt challenged with coming upright due to back pain    Ambulation/Gait Ambulation/Gait assistance: Contact guard assist Gait Distance (Feet): 5 Feet           General Gait Details: limited by pt reported fatigue and IV attached to bed  Stairs            Wheelchair Mobility     Tilt Bed    Modified Rankin (Stroke Patients Only)       Balance Overall balance assessment: Needs assistance Sitting-balance support: Feet supported Sitting balance-Leahy Scale: Good       Standing balance-Leahy Scale: Fair Standing balance comment: pt seemed more comfortable with at least SUE support                             Pertinent Vitals/Pain Pain Assessment Pain Assessment: Faces Faces Pain Scale: Hurts a little bit Pain Location: L sided rib pain Pain Descriptors / Indicators: Aching, Sore, Spasm Pain Intervention(s): Limited activity within patient's tolerance, Monitored during session, Repositioned    Home Living Family/patient expects to be discharged to::  (Pt stated due to recent social challenges, pt is planning on discharging to a friends house and then to a homeless shelter)  Prior Function Prior Level of Function : Independent/Modified Independent                     Extremity/Trunk Assessment   Upper Extremity Assessment Upper Extremity Assessment: Overall WFL for tasks assessed    Lower Extremity Assessment Lower Extremity Assessment:  (able to lift and move against gravity, true MMT deferred due to pain)       Communication         Cognition Arousal: Alert Behavior During Therapy: WFL for tasks assessed/performed, Anxious   PT - Cognitive impairments: No apparent impairments                         Following commands: Intact       Cueing       General Comments      Exercises     Assessment/Plan    PT Assessment Patient needs continued PT services  PT Problem List Decreased strength;Decreased activity tolerance;Decreased balance;Decreased mobility;Decreased knowledge of precautions       PT Treatment Interventions DME instruction;Balance training;Neuromuscular re-education;Gait training;Stair training;Functional mobility training;Therapeutic activities;Therapeutic exercise;Patient/family education    PT Goals (Current goals can be found in the Care Plan section)  Acute Rehab PT Goals Patient Stated Goal: to get his strength back PT Goal Formulation: With patient Time For Goal Achievement: 09/10/23 Potential to Achieve Goals: Good    Frequency Min 2X/week     Co-evaluation PT/OT/SLP Co-Evaluation/Treatment: Yes Reason for Co-Treatment: To address functional/ADL transfers;Necessary to address cognition/behavior during functional activity PT goals addressed during session: Proper use of DME;Balance;Mobility/safety with mobility OT goals addressed during session: ADL's and self-care       AM-PAC PT 6 Clicks Mobility  Outcome Measure Help needed turning from your back to your side while in a flat bed without using bedrails?: A Little Help needed moving from lying on your back to sitting on the side of a flat bed without using bedrails?: A Little Help needed moving to and from a bed to a chair (including a wheelchair)?: A Little Help needed standing up from a chair using your arms (e.g., wheelchair or bedside chair)?: A Little Help needed to walk in hospital room?: A Little Help needed climbing 3-5 steps with a railing? : A Little 6 Click Score: 18    End of Session    Activity Tolerance: Patient limited by pain;Patient limited by fatigue Patient left: in bed;with bed alarm set;with call bell/phone within reach Nurse Communication: Mobility status PT Visit Diagnosis: Other abnormalities of gait and mobility (R26.89);Difficulty in walking, not elsewhere classified (R26.2);Muscle weakness (generalized) (M62.81)    Time: 8657-8595 PT Time Calculation (min) (ACUTE ONLY): 22 min   Charges:   PT Evaluation $PT Eval Low Complexity: 1 Low PT Treatments $Therapeutic Activity: 8-22 mins PT General Charges $$ ACUTE PT VISIT: 1 Visit         Doyal Shams PT, DPT 3:51 PM,08/27/23

## 2023-08-27 NOTE — Evaluation (Signed)
 Occupational Therapy Evaluation Patient Details Name: Bryce Haney MRN: [id number] DOB: [birth year] Today's Date: 08/27/2023   History of Present Illness   Pt is a 73 yo male that presented to the ED for increased chest discomfort, also a recent fall at home. PMH of afib, back pain, CHF, COPD, HLD, MI s/p CABG, gout, depression, chronic pain syndrome, tremors.     Clinical Impressions Chart reviewed, pt greeted in bed with PT present requiring encouragement for participation in therapy therefore co tx completed due to pt decreased tolerance at this time. PTA pt reports he recently relocated back to [place] (he was in [place] , but has lived her since 2009 and many of his providers are here), was stayng with a friend but reports he is unsure of discharge location- potentially to homeless shelter. Will need to confirm. Pt reports he is generally MOD I-I with ADL/IADL, amb with no AD and has access to no AD. Pt presents with deficits in activity tolerance and endurance on this date affecting safe and optimal ADL completion. Bed mobility competed with supervision, STS with MIN A with steadying assist +1, short bouts of amb in room with no AD with supervision-CGA +2. Orthostatic vital signs recorded and can be found in flow sheets (pt does not appear to be orthostatic). Pt is left in bed, all needs met, OT will follow acutely to facilitate optimal ADL/functional mobility performance.     If plan is discharge home, recommend the following:   Assistance with cooking/housework;Assist for transportation     Functional Status Assessment   Patient has had a recent decline in their functional status and demonstrates the ability to make significant improvements in function in a reasonable and predictable amount of time.     Equipment Recommendations   2WW     Recommendations for Other Services         Precautions/Restrictions   Precautions Precautions: Fall Recall of  Precautions/Restrictions: Intact Restrictions Weight Bearing Restrictions Per Provider Order: No     Mobility Bed Mobility Overal bed mobility: Needs Assistance Bed Mobility: Supine to Sit, Sit to Supine     Supine to sit: Supervision, Used rails, HOB elevated Sit to supine: Supervision        Transfers Overall transfer level: Needs assistance Equipment used: 1 person hand held assist Transfers: Sit to/from Stand Sit to Stand: Contact guard assist, +2 safety/equipment           General transfer comment: anterior reaching for steadying support with any STS attempts      Balance Overall balance assessment: Needs assistance Sitting-balance support: Feet supported Sitting balance-Leahy Scale: Good     Standing balance support: Single extremity supported Standing balance-Leahy Scale: Fair                             ADL either performed or assessed with clinical judgement   ADL Overall ADL's : Needs assistance/impaired Eating/Feeding: Set up   Grooming: Supervision/safety               Lower Body Dressing: Minimal assistance Lower Body Dressing Details (indicate cue type and reason): MIN A for R sock as pt reports if he bends down too far he will pass out educated on compensatory techniques/alternate positioning; supervision L sock Toilet Transfer: Ambulation;Contact guard assist;Minimal assistance Toilet Transfer Details (indicate cue type and reason): simulated         Functional mobility during ADLs: Supervision/safety;Contact guard assist (approx 6' in  room with no AD (limited by IV hanging to bed, pt declining to attempt further amb))       Vision Patient Visual Report: No change from baseline       Perception         Praxis         Pertinent Vitals/Pain Pain Assessment Pain Assessment: Faces Faces Pain Scale: Hurts a little bit Pain Location: L sided rib pain Pain Descriptors / Indicators: Aching, Sore, Spasm Pain  Intervention(s): Monitored during session, Limited activity within patient's tolerance, Repositioned     Extremity/Trunk Assessment Upper Extremity Assessment Upper Extremity Assessment: Overall WFL for tasks assessed   Lower Extremity Assessment Lower Extremity Assessment: Defer to PT evaluation       Communication Communication Communication: Impaired Factors Affecting Communication: Hearing impaired   Cognition Arousal: Alert Behavior During Therapy: WFL for tasks assessed/performed, Anxious Cognition: No family/caregiver present to determine baseline, Cognition impaired           Executive functioning impairment (select all impairments): Problem solving OT - Cognition Comments: ?insight into deficits                 Following commands: Intact       Cueing  General Comments   Cueing Techniques: Verbal cues  orthostatics recorded, refer to flowsheet   Exercises Other Exercises Other Exercises: edu re: role of OT, role of rehab, discharge recommendations, home safety, falls prevention, DME/AE for safe ADL completion   Shoulder Instructions      Home Living Family/patient expects to be discharged to::  (Pt stated due to recent social challenges, pt is planning on discharging to a friends house and then to a homeless shelter)                                 Additional Comments: pt reports he has been staying with a friend since about last week when he flew here from [place] , reports he is considering discharging to a homeless shelter after dc      Prior Functioning/Environment Prior Level of Function : Independent/Modified Independent                    OT Problem List: Decreased activity tolerance;Decreased knowledge of use of DME or AE   OT Treatment/Interventions: Self-care/ADL training;DME and/or AE instruction;Therapeutic activities;Balance training;Therapeutic exercise;Patient/family education;Energy conservation      OT  Goals(Current goals can be found in the care plan section)   Acute Rehab OT Goals Patient Stated Goal: figure out what is going on OT Goal Formulation: With patient Time For Goal Achievement: 09/10/23 Potential to Achieve Goals: Good ADL Goals Pt Will Perform Grooming: Independently;sitting;standing Pt Will Perform Lower Body Dressing: with modified independence Pt Will Transfer to Toilet: with modified independence;ambulating Pt Will Perform Toileting - Clothing Manipulation and hygiene: with modified independence   OT Frequency:  Min 2X/week    Co-evaluation PT/OT/SLP Co-Evaluation/Treatment: Yes Reason for Co-Treatment: To address functional/ADL transfers;Necessary to address cognition/behavior during functional activity PT goals addressed during session: Proper use of DME;Balance;Mobility/safety with mobility OT goals addressed during session: ADL's and self-care      AM-PAC OT 6 Clicks Daily Activity     Outcome Measure Help from another person eating meals?: None Help from another person taking care of personal grooming?: None Help from another person toileting, which includes using toliet, bedpan, or urinal?: None Help from another person bathing (including washing, rinsing, drying)?:  A Little Help from another person to put on and taking off regular upper body clothing?: None Help from another person to put on and taking off regular lower body clothing?: A Little 6 Click Score: 22   End of Session Nurse Communication: Mobility status  Activity Tolerance: Patient limited by pain Patient left: in bed;with call bell/phone within reach  OT Visit Diagnosis: Other abnormalities of gait and mobility (R26.89)                Time: 8653-8595 OT Time Calculation (min): 18 min Charges:  OT General Charges $OT Visit: 1 Visit OT Evaluation $OT Eval Low Complexity: 1 Low  Therisa Sheffield, OTD OTR/L  08/27/23, 4:03 PM

## 2023-08-28 DIAGNOSIS — I4892 Unspecified atrial flutter: Secondary | ICD-10-CM | POA: Diagnosis present

## 2023-08-28 DIAGNOSIS — Z1152 Encounter for screening for COVID-19: Secondary | ICD-10-CM | POA: Diagnosis not present

## 2023-08-28 DIAGNOSIS — F32A Depression, unspecified: Secondary | ICD-10-CM | POA: Diagnosis present

## 2023-08-28 DIAGNOSIS — Z885 Allergy status to narcotic agent status: Secondary | ICD-10-CM | POA: Diagnosis not present

## 2023-08-28 DIAGNOSIS — Z79899 Other long term (current) drug therapy: Secondary | ICD-10-CM | POA: Diagnosis not present

## 2023-08-28 DIAGNOSIS — Z7982 Long term (current) use of aspirin: Secondary | ICD-10-CM | POA: Diagnosis not present

## 2023-08-28 DIAGNOSIS — W2203XA Walked into furniture, initial encounter: Secondary | ICD-10-CM | POA: Diagnosis present

## 2023-08-28 DIAGNOSIS — I110 Hypertensive heart disease with heart failure: Secondary | ICD-10-CM | POA: Diagnosis present

## 2023-08-28 DIAGNOSIS — E039 Hypothyroidism, unspecified: Secondary | ICD-10-CM | POA: Diagnosis present

## 2023-08-28 DIAGNOSIS — Z955 Presence of coronary angioplasty implant and graft: Secondary | ICD-10-CM | POA: Diagnosis not present

## 2023-08-28 DIAGNOSIS — R0789 Other chest pain: Secondary | ICD-10-CM | POA: Diagnosis present

## 2023-08-28 DIAGNOSIS — R079 Chest pain, unspecified: Secondary | ICD-10-CM

## 2023-08-28 DIAGNOSIS — R0602 Shortness of breath: Secondary | ICD-10-CM | POA: Diagnosis present

## 2023-08-28 DIAGNOSIS — Z5321 Procedure and treatment not carried out due to patient leaving prior to being seen by health care provider: Secondary | ICD-10-CM | POA: Diagnosis present

## 2023-08-28 DIAGNOSIS — I2510 Atherosclerotic heart disease of native coronary artery without angina pectoris: Secondary | ICD-10-CM | POA: Diagnosis present

## 2023-08-28 DIAGNOSIS — E782 Mixed hyperlipidemia: Secondary | ICD-10-CM | POA: Diagnosis present

## 2023-08-28 DIAGNOSIS — Z951 Presence of aortocoronary bypass graft: Secondary | ICD-10-CM | POA: Diagnosis not present

## 2023-08-28 DIAGNOSIS — K219 Gastro-esophageal reflux disease without esophagitis: Secondary | ICD-10-CM | POA: Diagnosis present

## 2023-08-28 DIAGNOSIS — F17210 Nicotine dependence, cigarettes, uncomplicated: Secondary | ICD-10-CM | POA: Diagnosis present

## 2023-08-28 DIAGNOSIS — Z79890 Hormone replacement therapy (postmenopausal): Secondary | ICD-10-CM | POA: Diagnosis not present

## 2023-08-28 DIAGNOSIS — G894 Chronic pain syndrome: Secondary | ICD-10-CM | POA: Diagnosis present

## 2023-08-28 DIAGNOSIS — E876 Hypokalemia: Secondary | ICD-10-CM | POA: Diagnosis present

## 2023-08-28 DIAGNOSIS — Z5900 Homelessness unspecified: Secondary | ICD-10-CM | POA: Diagnosis not present

## 2023-08-28 DIAGNOSIS — R296 Repeated falls: Secondary | ICD-10-CM | POA: Diagnosis present

## 2023-08-28 DIAGNOSIS — J449 Chronic obstructive pulmonary disease, unspecified: Secondary | ICD-10-CM | POA: Diagnosis present

## 2023-08-28 DIAGNOSIS — Z8249 Family history of ischemic heart disease and other diseases of the circulatory system: Secondary | ICD-10-CM | POA: Diagnosis not present

## 2023-08-28 DIAGNOSIS — Y929 Unspecified place or not applicable: Secondary | ICD-10-CM | POA: Diagnosis not present

## 2023-08-28 DIAGNOSIS — I739 Peripheral vascular disease, unspecified: Secondary | ICD-10-CM | POA: Diagnosis present

## 2023-08-28 DIAGNOSIS — I480 Paroxysmal atrial fibrillation: Secondary | ICD-10-CM | POA: Diagnosis present

## 2023-08-28 LAB — BASIC METABOLIC PANEL WITH GFR
Anion gap: 4 — ABNORMAL LOW (ref 5–15)
BUN: 12 mg/dL (ref 8–23)
CO2: 25 mmol/L (ref 22–32)
Calcium: 9.1 mg/dL (ref 8.9–10.3)
Chloride: 107 mmol/L (ref 98–111)
Creatinine, Ser: 1 mg/dL (ref 0.61–1.24)
GFR, Estimated: >60 mL/min (ref >60)
Glucose, Bld: 90 mg/dL (ref 70–99)
Potassium: 4.3 mmol/L (ref 3.5–5.1)
Sodium: 136 mmol/L (ref 135–145)

## 2023-08-28 LAB — CBC
HCT: 37.9 % — ABNORMAL LOW (ref 39.0–52.0)
Hemoglobin: 13.1 g/dL (ref 13.0–17.0)
MCH: 32 pg (ref 26.0–34.0)
MCHC: 34.6 g/dL (ref 30.0–36.0)
MCV: 92.4 fL (ref 80.0–100.0)
Platelets: 194 10*3/uL (ref 150–400)
RBC: 4.1 MIL/uL — ABNORMAL LOW (ref 4.22–5.81)
RDW: 12.5 % (ref 11.5–15.5)
WBC: 8 10*3/uL (ref 4.0–10.5)
nRBC: 0 % (ref 0.0–0.2)

## 2023-08-28 LAB — MAGNESIUM: Magnesium: 1.9 mg/dL (ref 1.7–2.4)

## 2023-08-28 NOTE — Care Management Obs Status (Signed)
 MEDICARE OBSERVATION STATUS NOTIFICATION   Patient Details  Name: Bryce Haney MRN: [id number] Date of Birth: 1950-03-26   Medicare Observation Status Notification Given:  No (patient did not want a copy)    [name] 08/28/2023, 12:54 PM

## 2023-08-28 NOTE — Plan of Care (Signed)

## 2023-08-28 NOTE — Progress Notes (Signed)
 Progress Note   Patient: Bryce Haney FMW:[id number] DOB: 24-Aug-1950 DOA: 08/27/2023     0 DOS: the patient was seen and examined on 08/28/2023   Brief hospital course: From HPI Earley Grobe is a 73 y.o. year old male with past medical history of hypothyroidism, paroxysmal A-fib not on blood thinners, coronary artery disease status post CABG with LIMA to LAD and SVG to D1, GERD, tremors, hypertension hyperlipidemia, CHF, COPD, degenerative disc disease, gout, depression, and chronic pain syndrome.  He presents to [facility] ED with reports of increased chest discomfort described as tightness with associated dyspnea that started last night.  In the ED it was noted that he was somnolent but arousable to voice.  On my interview he was alert and able to stay awake throughout the conversation without falling asleep however as I was leaving the room noted patient to have fallen asleep prior to me exiting the room.  Of note he recently returned to [place]  from [place]  approximately 1.5 weeks ago.  There is no calf tenderness or swelling on exam. He also reports that he had a fall recently and point of impact was his left rib cage on a table.  No reported head strike and he is not on blood thinners.  At bedside this morning Mr. Madani describes his chest pain as feeling as though and elephant is sitting on his chest and tightness with associated dyspnea.  This morning he rates the pain 1 out of 10.  He endorses being fatigued since February, having episodes where he blacks out, and intermittent falls.  These episodes where he blacks out are not preceded by lightheadedness, dizziness, or change in sensorium.  He denies palpitations, vomiting.  He does endorse nausea preceding these events.   ED Course: On arrival to [facility] ED ED patient was noted to be afebrile temp 36.9 C, BP 131/85, HR 80, RR 20, SpO2 96% on room air.  CT head/C-spine obtained and negative for any acute  intracranial or cervical spine abnormality.  Did show degenerative disc disease of C-spine.  CXR obtained and negative for any acute cardiopulmonary disease.  Plain film of left ribs obtained and negative for any fractures.  Labs notable for leukocytosis with WBC 17.6, potassium 3.1,Troponin 21-->35, TSH 12.373, magnesium  1.5, UDS positive for THC and PCP.  He was given 2 g IV magnesium , 40 mEq p.o. K, and 325 mg aspirin  with EMS.  [facility] contacted for admission.    Assessment and Plan:   ## Chest pain Slight troponin elevation According to cardiologist chest pain is most likely musculoskeletal given recent fall Patient still requiring IV pain medication for relief We appreciate the input of cardiology   #Syncope Echocardiogram showing normal EF CT scan of the brain did not show any acute intracranial pathology   ## Hypokalemia ## Hypomagnesemia - Received 2 g IV magnesium  and 40 mEq p.o. potassium in ED - Replete as needed - Repeat with a.m. labs   #Hypertension Continue losartan    #Coronary artery disease #Hyperlipidemia Continue home Lipitor, aspirin    #Paroxysmal A-fib Not on blood thinners or rate control medication outpatient   #COPD - Continue home bronchodilator - As needed nebulizers while inpatient   #Hypothyroidism TSH elevated, with normal FT4 - Home Synthroid  dose increased from 125 mcg daily to 150 mcg daily - Outpatient endocrine follow-up   #GERD - Protonix    VTE prophylaxis:  Lovenox   GI prophylaxis: Protonix  Diet: Heart healthy Code Status: Full Telemetry: Yes    Family  Communication: Patient declined to provide information for family/friend to be updated   Consults called: [facility] cardiology   Subjective:  Patient seen and examined at bedside this morning Still having significant pain involving the left part of the chest Intensity of 7-9/10 Patient still requiring IV pain medication for pain management Denies nausea vomiting abdominal  pain  Physical Exam:  Constitutional: Anxious, hard of hearing Respiratory: clear to auscultation bilaterally, no wheezing, no crackles. Normal respiratory effort. No accessory muscle use.  Cardiovascular: Regular rate and rhythm, no murmurs / rubs / gallops. No extremity edema. 2+ radial and pedal pulses.  Abdomen: Soft, nontender. Bowel sounds positive x4 quadrants. (L) rib cage more prominent than (R) Musculoskeletal: no clubbing / cyanosis. No joint deformity upper and lower extremities. Good ROM, no contractures. Normal muscle tone.  Skin: no rashes, lesions, ulcers. No induration Neurologic: Alert and oriented x 3.  Vitals:   08/27/23 2344 08/28/23 0412 08/28/23 0731 08/28/23 1140  BP: (!) 143/84 137/75 (!) 141/75 (!) 151/72  Pulse: (!) 52 (!) 55 (!) 50 (!) 51  Resp: 18 18 17 18   Temp: 98.3 F (36.8 C) 98.3 F (36.8 C) 98.2 F (36.8 C) 98.7 F (37.1 C)  TempSrc:      SpO2: 98% 97% 97% 97%  Weight:      Height:        Data Reviewed:    Latest Ref Rng & Units 08/28/2023    1:05 AM 08/27/2023    1:11 AM 08/19/2019   10:12 AM  CBC  WBC 4.0 - 10.5 K/uL 8.0  17.6  7.4   Hemoglobin 13.0 - 17.0 g/dL 86.8  84.2  84.0   Hematocrit 39.0 - 52.0 % 37.9  45.0  47.0   Platelets 150 - 400 K/uL 194  213  248        Latest Ref Rng & Units 08/28/2023    1:05 AM 08/27/2023    1:11 AM 08/19/2019   10:12 AM  BMP  Glucose 70 - 99 mg/dL 90  899  93   BUN 8 - 23 mg/dL 12  13  11    Creatinine 0.61 - 1.24 mg/dL 8.99  9.02  9.00   Sodium 135 - 145 mmol/L 136  135  139   Potassium 3.5 - 5.1 mmol/L 4.3  3.1  3.7   Chloride 98 - 111 mmol/L 107  103  103   CO2 22 - 32 mmol/L 25  21  28    Calcium  8.9 - 10.3 mg/dL 9.1  9.7  9.3    Family Communication: None at bedside  Disposition: Status is: Inpatient   Time spent: 56 minutes  Author: Drue ONEIDA Potter, MD 08/28/2023 4:06 PM  For on call review [email or web address].

## 2023-08-28 NOTE — Progress Notes (Signed)
 Patient asking about if he can get assistance to get home. States that the person who he was staying with since he came home from [place]  called  him Saturday (June 21) evenign and told him not to come back. Pt reports that all his belongings are in the house. Pt states he has relatives that lives nearby that he can stay without although he said they are not an ideal place which is why he did not stay there to start with. Educated that TOC would most likely speak with him tomorrow. Verbalizes understanding.

## 2023-08-29 ENCOUNTER — Other Ambulatory Visit: Payer: Self-pay

## 2023-08-29 ENCOUNTER — Emergency Department
Admission: EM | Admit: 2023-08-29 | Discharge: 2023-08-29 | Discharge status: Against Medical Advice | Attending: Emergency Medicine | Admitting: Emergency Medicine

## 2023-08-29 ENCOUNTER — Encounter: Payer: Self-pay | Admitting: Emergency Medicine

## 2023-08-29 DIAGNOSIS — Z5900 Homelessness unspecified: Secondary | ICD-10-CM | POA: Insufficient documentation

## 2023-08-29 DIAGNOSIS — Z5321 Procedure and treatment not carried out due to patient leaving prior to being seen by health care provider: Secondary | ICD-10-CM | POA: Insufficient documentation

## 2023-08-29 DIAGNOSIS — R079 Chest pain, unspecified: Secondary | ICD-10-CM | POA: Diagnosis not present

## 2023-08-29 LAB — CBC WITH DIFFERENTIAL/PLATELET
Abs Immature Granulocytes: 0.02 10*3/uL (ref 0.00–0.07)
Basophils Absolute: 0.1 10*3/uL (ref 0.0–0.1)
Basophils Relative: 1 %
Eosinophils Absolute: 0.2 10*3/uL (ref 0.0–0.5)
Eosinophils Relative: 2 %
HCT: 41.3 % (ref 39.0–52.0)
Hemoglobin: 14.4 g/dL (ref 13.0–17.0)
Immature Granulocytes: 0 %
Lymphocytes Relative: 27 %
Lymphs Abs: 2.3 10*3/uL (ref 0.7–4.0)
MCH: 31.9 pg (ref 26.0–34.0)
MCHC: 34.9 g/dL (ref 30.0–36.0)
MCV: 91.6 fL (ref 80.0–100.0)
Monocytes Absolute: 0.7 10*3/uL (ref 0.1–1.0)
Monocytes Relative: 8 %
Neutro Abs: 5.3 10*3/uL (ref 1.7–7.7)
Neutrophils Relative %: 62 %
Platelets: 193 10*3/uL (ref 150–400)
RBC: 4.51 MIL/uL (ref 4.22–5.81)
RDW: 12.2 % (ref 11.5–15.5)
WBC: 8.5 10*3/uL (ref 4.0–10.5)
nRBC: 0 % (ref 0.0–0.2)

## 2023-08-29 LAB — BASIC METABOLIC PANEL WITH GFR
Anion gap: 5 (ref 5–15)
BUN: 16 mg/dL (ref 8–23)
CO2: 24 mmol/L (ref 22–32)
Calcium: 9.5 mg/dL (ref 8.9–10.3)
Chloride: 103 mmol/L (ref 98–111)
Creatinine, Ser: 1.01 mg/dL (ref 0.61–1.24)
GFR, Estimated: >60 mL/min (ref >60)
Glucose, Bld: 87 mg/dL (ref 70–99)
Potassium: 4.1 mmol/L (ref 3.5–5.1)
Sodium: 132 mmol/L — ABNORMAL LOW (ref 135–145)

## 2023-08-29 MED ORDER — ACETAMINOPHEN 325 MG PO TABS: 650.0000 mg | ORAL_TABLET | ORAL | 0 refills | Status: AC | PRN

## 2023-08-29 MED ORDER — OXYCODONE HCL 5 MG PO TABS: 5.0000 mg | ORAL_TABLET | Freq: Three times a day (TID) | ORAL | 0 refills | Status: AC | PRN

## 2023-08-29 MED ORDER — POLYETHYLENE GLYCOL 3350 17 G PO PACK: 17.0000 g | PACK | Freq: Every day | ORAL | 0 refills | Status: AC | PRN

## 2023-08-29 MED ORDER — HYDROXYZINE HCL 25 MG PO TABS
25.0000 mg | ORAL_TABLET | Freq: Four times a day (QID) | ORAL | Status: DC | PRN
  Administered 2023-08-29: 25 mg via ORAL
  Filled 2023-08-29: qty 1

## 2023-08-29 NOTE — Discharge Summary (Signed)
 Physician Discharge Summary   Patient: Bryce Haney MRN: [id number] DOB: 09-07-1950  Admit date:     08/27/2023  Discharge date: 08/29/23  Discharge Physician: Drue ONEIDA Potter   PCP: Pcp, No   Recommendations at discharge:  Follow-up with PCP  Discharge Diagnoses:  ## Chest pain secondary to musculoskeletal pain #Syncope ## Hypokalemia ## Hypomagnesemia #Hypertension #Coronary artery disease #Hyperlipidemia #Paroxysmal A-fib #COPD #Hypothyroidism #GERD   Hospital Course: Deangleo Passage is a 73 y.o. year old male with past medical history of hypothyroidism, paroxysmal A-fib not on blood thinners, coronary artery disease status post CABG with LIMA to LAD and SVG to D1, GERD, tremors, hypertension hyperlipidemia, CHF, COPD, degenerative disc disease, gout, depression, and chronic pain syndrome.  He presents to [facility] ED with reports of increased chest discomfort described as tightness with associated dyspnea that started last night.  Of note he recently returned to [place]  from [place]  approximately 1.5 weeks ago.  Patient was seen by cardiologist and pain thought to be due to musculoskeletal in origin.  The patient has therefore been cleared for discharge today and will follow-up as an outpatient.    Consultants: Cardiology Procedures performed: None Disposition: Home Diet recommendation:  Cardiac diet  DISCHARGE MEDICATION: Allergies as of 08/29/2023       Reactions   Morphine And Codeine Hives        Medication List     TAKE these medications    acetaminophen  325 MG tablet Commonly known as: TYLENOL  Take 2 tablets (650 mg total) by mouth every 4 (four) hours as needed for headache or mild pain (pain score 1-3).   aspirin  81 MG tablet Take 81 mg by mouth daily.   atorvastatin  40 MG tablet Commonly known as: LIPITOR Take 40 mg by mouth daily.   busPIRone  15 MG tablet Commonly known as: BUSPAR  Take 15 mg by mouth 2 (two) times daily.    gabapentin  300 MG capsule Commonly known as: NEURONTIN  Take 300 mg by mouth at bedtime.   levothyroxine  150 MCG tablet Commonly known as: SYNTHROID  Take 150 mcg by mouth daily before breakfast. What changed: Another medication with the same name was removed. Continue taking this medication, and follow the directions you see here.   losartan  25 MG tablet Commonly known as: COZAAR  Take 25 mg by mouth daily.   methocarbamol  750 MG tablet Commonly known as: ROBAXIN  Take 750 mg by mouth 3 (three) times daily.   mirtazapine  30 MG disintegrating tablet Commonly known as: REMERON  SOL-TAB Take 30 mg by mouth at bedtime.   omeprazole 20 MG capsule Commonly known as: PRILOSEC Take 20 mg by mouth daily.   oxyCODONE  5 MG immediate release tablet Commonly known as: Roxicodone  Take 1 tablet (5 mg total) by mouth every 8 (eight) hours as needed.   polyethylene glycol 17 g packet Commonly known as: MIRALAX / GLYCOLAX Take 17 g by mouth daily as needed for mild constipation.        Discharge Exam: Filed Weights   08/27/23 0031  Weight: 97.5 kg   Constitutional: Anxious, hard of hearing Respiratory: clear to auscultation bilaterally, no wheezing, no crackles. Normal respiratory effort. No accessory muscle use.  Cardiovascular: Regular rate and rhythm, no murmurs / rubs / gallops. No extremity edema. 2+ radial and pedal pulses.  Abdomen: Soft, nontender. Bowel sounds positive x4 quadrants. (L) rib cage more prominent than (R) Musculoskeletal: no clubbing / cyanosis. No joint deformity upper and lower extremities. Good ROM, no contractures. Normal muscle tone.  Skin: no rashes, lesions, ulcers. No induration Neurologic: Alert and oriented x 3.  Condition at discharge: good  The results of significant diagnostics from this hospitalization (including imaging, microbiology, ancillary and laboratory) are listed below for reference.   Imaging Studies: ECHOCARDIOGRAM COMPLETE Result  Date: 08/27/2023    ECHOCARDIOGRAM REPORT   Patient Name:   Bryce Haney Date of Exam: 08/27/2023 Medical Rec #:  [id number]       Height:       72.0 in Accession #:    [id number]      Weight:       214.9 lb Date of Birth:  1950/11/07       BSA:          2.197 m Patient Age:    73 years        BP:           135/87 mmHg Patient Gender: M               HR:           65 bpm. Exam Location:  [facility] Procedure: 2D Echo, Cardiac Doppler and Color Doppler (Both Spectral and Color            Flow Doppler were utilized during procedure). Indications:     Dyspnea R06.00  History:         Patient has no prior history of Echocardiogram examinations.                  CHF; Arrythmias:Atrial Fibrillation.  Sonographer:     Bari Roar Referring Phys:  [id number] EVALENE JINNY LUNGER Diagnosing Phys: EVALENE LUNGER MD IMPRESSIONS  1. Left ventricular ejection fraction, by estimation, is 55 to 60%. The left ventricle has normal function. The left ventricle has no regional wall motion abnormalities. Left ventricular diastolic parameters were normal.  2. Right ventricular systolic function is normal. The right ventricular size is normal.  3. Left atrial size was mildly dilated.  4. The mitral valve is normal in structure. Moderate mitral valve regurgitation. No evidence of mitral stenosis.  5. Tricuspid valve regurgitation is mild to moderate.  6. The aortic valve is tricuspid. Aortic valve regurgitation is not visualized. No aortic stenosis is present.  7. The inferior vena cava is normal in size with greater than 50% respiratory variability, suggesting right atrial pressure of 3 mmHg. FINDINGS  Left Ventricle: Left ventricular ejection fraction, by estimation, is 55 to 60%. The left ventricle has normal function. The left ventricle has no regional wall motion abnormalities. Strain was performed and the global longitudinal strain is indeterminate. The left ventricular internal cavity size was normal in size. There is no left ventricular  hypertrophy. Left ventricular diastolic parameters were normal. Right Ventricle: The right ventricular size is normal. No increase in right ventricular wall thickness. Right ventricular systolic function is normal. Left Atrium: Left atrial size was mildly dilated. Right Atrium: Right atrial size was normal in size. Pericardium: There is no evidence of pericardial effusion. Mitral Valve: The mitral valve is normal in structure. Moderate mitral valve regurgitation. No evidence of mitral valve stenosis. MV peak gradient, 4.2 mmHg. The mean mitral valve gradient is 1.0 mmHg. Tricuspid Valve: The tricuspid valve is normal in structure. Tricuspid valve regurgitation is mild to moderate. No evidence of tricuspid stenosis. Aortic Valve: The aortic valve is tricuspid. Aortic valve regurgitation is not visualized. No aortic stenosis is present. Aortic valve mean gradient measures 4.0 mmHg. Aortic valve peak gradient measures 7.7 mmHg.  Aortic valve area, by VTI measures 2.41 cm. Pulmonic Valve: The pulmonic valve was normal in structure. Pulmonic valve regurgitation is not visualized. No evidence of pulmonic stenosis. Aorta: The aortic root is normal in size and structure. Venous: The inferior vena cava is normal in size with greater than 50% respiratory variability, suggesting right atrial pressure of 3 mmHg. IAS/Shunts: No atrial level shunt detected by color flow Doppler. Additional Comments: 3D was performed not requiring image post processing on an independent workstation and was indeterminate.  LEFT VENTRICLE PLAX 2D LVIDd:         4.80 cm     Diastology LVIDs:         3.60 cm     LV e' medial:    11.20 cm/s LV PW:         1.20 cm     LV E/e' medial:  8.8 LV IVS:        1.40 cm     LV e' lateral:   17.50 cm/s LVOT diam:     1.90 cm     LV E/e' lateral: 5.6 LV SV:         67 LV SV Index:   30 LVOT Area:     2.84 cm  LV Volumes (MOD) LV vol d, MOD A2C: 96.8 ml LV vol d, MOD A4C: 95.5 ml LV vol s, MOD A2C: 47.2 ml LV vol  s, MOD A4C: 46.6 ml LV SV MOD A2C:     49.6 ml LV SV MOD A4C:     95.5 ml LV SV MOD BP:      49.7 ml RIGHT VENTRICLE RV Basal diam:  3.60 cm RV Mid diam:    2.90 cm RV S prime:     10.00 cm/s TAPSE (M-mode): 2.2 cm LEFT ATRIUM             Index        RIGHT ATRIUM           Index LA diam:        4.50 cm 2.05 cm/m   RA Area:     18.00 cm LA Vol (A2C):   74.7 ml 34.00 ml/m  RA Volume:   49.60 ml  22.58 ml/m LA Vol (A4C):   62.6 ml 28.50 ml/m LA Biplane Vol: 71.3 ml 32.46 ml/m  AORTIC VALVE                    PULMONIC VALVE AV Area (Vmax):    2.51 cm     PV Vmax:          1.17 m/s AV Area (Vmean):   2.24 cm     PV Peak grad:     5.5 mmHg AV Area (VTI):     2.41 cm     PR End Diast Vel: 7.29 msec AV Vmax:           139.00 cm/s  RVOT Peak grad:   2 mmHg AV Vmean:          88.100 cm/s AV VTI:            0.276 m AV Peak Grad:      7.7 mmHg AV Mean Grad:      4.0 mmHg LVOT Vmax:         123.00 cm/s LVOT Vmean:        69.600 cm/s LVOT VTI:          0.235 m LVOT/AV VTI ratio: 0.85  AORTA Ao Root diam: 3.10 cm Ao Asc diam:  3.30 cm MITRAL VALVE               TRICUSPID VALVE MV Area (PHT): 3.87 cm    TR Peak grad:   26.4 mmHg MV Area VTI:   1.82 cm    TR Vmax:        257.00 cm/s MV Peak grad:  4.2 mmHg MV Mean grad:  1.0 mmHg    SHUNTS MV Vmax:       1.02 m/s    Systemic VTI:  0.24 m MV Vmean:      56.1 cm/s   Systemic Diam: 1.90 cm MV Decel Time: 196 msec MV E velocity: 98.00 cm/s MV A velocity: 76.50 cm/s MV E/A ratio:  1.28 MV A Prime:    10.9 cm/s Evalene Lunger MD Electronically signed by Evalene Lunger MD Signature Date/Time: 08/27/2023/3:21:04 PM    Final    DG Ribs Unilateral Left Result Date: 08/27/2023 CLINICAL DATA:  Fall, left chest pain EXAM: LEFT RIBS - 2 VIEW COMPARISON:  None Available. FINDINGS: No fracture or other bone lesions are seen involving the ribs. IMPRESSION: Negative. Electronically Signed   By: Dorethia Molt M.D.   On: 08/27/2023 01:51   DG Chest Portable 1 View Result Date:  08/27/2023 CLINICAL DATA:  Dyspnea, bradycardia EXAM: PORTABLE CHEST 1 VIEW COMPARISON:  None Available. FINDINGS: Lungs are well expanded, symmetric, and clear. No pneumothorax or pleural effusion. Cardiac size within normal limits. Status post coronary artery bypass grafting. Pulmonary vascularity is normal. Osseous structures are age-appropriate. No acute bone abnormality. IMPRESSION: No active disease. Electronically Signed   By: Dorethia Molt M.D.   On: 08/27/2023 01:50   CT HEAD WO CONTRAST ( ) Result Date: 08/27/2023 CLINICAL DATA:  Head trauma, minor (Age >= 65y) Mental status change, unknown cause; Neck trauma (Age >= 65y) EXAM: CT HEAD WITHOUT CONTRAST CT CERVICAL SPINE WITHOUT CONTRAST TECHNIQUE: Multidetector CT imaging of the head and cervical spine was performed following the standard protocol without intravenous contrast. Multiplanar CT image reconstructions of the cervical spine were also generated. RADIATION DOSE REDUCTION: This exam was performed according to the departmental dose-optimization program which includes automated exposure control, adjustment of the mA and/or kV according to patient size and/or use of iterative reconstruction technique. COMPARISON:  None Available. FINDINGS: CT HEAD FINDINGS Brain: Motion degraded examination. No evidence of acute infarction, hemorrhage, hydrocephalus, extra-axial collection or mass lesion/mass effect. Vascular: No hyperdense vessel or unexpected calcification. Skull: Normal. Negative for fracture or focal lesion. Sinuses/Orbits: No acute finding. Other: Mastoid air cells and middle ear cavities are clear. CT CERVICAL SPINE FINDINGS Alignment: Normal. Skull base and vertebrae: No acute fracture. No primary bone lesion or focal pathologic process. Soft tissues and spinal canal: No prevertebral fluid or swelling. No visible canal hematoma. Disc levels: Disc space narrowing and endplate remodeling throughout the cervical spine, most severe at C4-C7  in keeping with changes of advanced degenerative disc disease. Prevertebral soft tissues are not thickened on sagittal reformats. No high-grade canal stenosis. Uncovertebral arthrosis results in moderate to severe neuroforaminal narrowing on the right at C4-5 and C5-6 and on the left at C6-7. Upper chest: Emphysema Other: None IMPRESSION: 1. Motion degraded examination without acute intracranial abnormality. 2. No acute fracture or subluxation of the cervical spine. 3. Multilevel degenerative disc disease and facet arthropathy of the cervical spine. Electronically Signed   By: Dorethia Molt M.D.   On: 08/27/2023 01:49   CT  Cervical Spine Wo Contrast Result Date: 08/27/2023 CLINICAL DATA:  Head trauma, minor (Age >= 65y) Mental status change, unknown cause; Neck trauma (Age >= 65y) EXAM: CT HEAD WITHOUT CONTRAST CT CERVICAL SPINE WITHOUT CONTRAST TECHNIQUE: Multidetector CT imaging of the head and cervical spine was performed following the standard protocol without intravenous contrast. Multiplanar CT image reconstructions of the cervical spine were also generated. RADIATION DOSE REDUCTION: This exam was performed according to the departmental dose-optimization program which includes automated exposure control, adjustment of the mA and/or kV according to patient size and/or use of iterative reconstruction technique. COMPARISON:  None Available. FINDINGS: CT HEAD FINDINGS Brain: Motion degraded examination. No evidence of acute infarction, hemorrhage, hydrocephalus, extra-axial collection or mass lesion/mass effect. Vascular: No hyperdense vessel or unexpected calcification. Skull: Normal. Negative for fracture or focal lesion. Sinuses/Orbits: No acute finding. Other: Mastoid air cells and middle ear cavities are clear. CT CERVICAL SPINE FINDINGS Alignment: Normal. Skull base and vertebrae: No acute fracture. No primary bone lesion or focal pathologic process. Soft tissues and spinal canal: No prevertebral fluid  or swelling. No visible canal hematoma. Disc levels: Disc space narrowing and endplate remodeling throughout the cervical spine, most severe at C4-C7 in keeping with changes of advanced degenerative disc disease. Prevertebral soft tissues are not thickened on sagittal reformats. No high-grade canal stenosis. Uncovertebral arthrosis results in moderate to severe neuroforaminal narrowing on the right at C4-5 and C5-6 and on the left at C6-7. Upper chest: Emphysema Other: None IMPRESSION: 1. Motion degraded examination without acute intracranial abnormality. 2. No acute fracture or subluxation of the cervical spine. 3. Multilevel degenerative disc disease and facet arthropathy of the cervical spine. Electronically Signed   By: Dorethia Molt M.D.   On: 08/27/2023 01:49    Microbiology: Results for orders placed or performed during the hospital encounter of 08/27/23  Resp panel by RT-PCR (RSV, Flu A&B, Covid) Anterior Nasal Swab     Status: None   Collection Time: 08/27/23  3:13 AM   Specimen: Anterior Nasal Swab  Result Value Ref Range Status   SARS Coronavirus 2 by RT PCR NEGATIVE NEGATIVE Final    Comment: (NOTE) SARS-CoV-2 target nucleic acids are NOT DETECTED.  The SARS-CoV-2 RNA is generally detectable in upper respiratory specimens during the acute phase of infection. The lowest concentration of SARS-CoV-2 viral copies this assay can detect is 138 copies/mL. A negative result does not preclude SARS-Cov-2 infection and should not be used as the sole basis for treatment or other patient management decisions. A negative result may occur with  improper specimen collection/handling, submission of specimen other than nasopharyngeal swab, presence of viral mutation(s) within the areas targeted by this assay, and inadequate number of viral copies(<138 copies/mL). A negative result must be combined with clinical observations, patient history, and epidemiological information. The expected result is  Negative.  Fact Sheet for Patients:  BloggerCourse.com  Fact Sheet for Healthcare Providers:  SeriousBroker.it  This test is no t yet approved or cleared by the [place]  FDA and  has been authorized for detection and/or diagnosis of SARS-CoV-2 by FDA under an Emergency Use Authorization (EUA). This EUA will remain  in effect (meaning this test can be used) for the duration of the COVID-19 declaration under Section 564(b)(1) of the Act, 21 U.S.C.section 360bbb-3(b)(1), unless the authorization is terminated  or revoked sooner.       Influenza A by PCR NEGATIVE NEGATIVE Final   Influenza B by PCR NEGATIVE NEGATIVE Final    Comment: (  NOTE) The Xpert Xpress SARS-CoV-2/FLU/RSV plus assay is intended as an aid in the diagnosis of influenza from Nasopharyngeal swab specimens and should not be used as a sole basis for treatment. Nasal washings and aspirates are unacceptable for Xpert Xpress SARS-CoV-2/FLU/RSV testing.  Fact Sheet for Patients: BloggerCourse.com  Fact Sheet for Healthcare Providers: SeriousBroker.it  This test is not yet approved or cleared by the [place]  FDA and has been authorized for detection and/or diagnosis of SARS-CoV-2 by FDA under an Emergency Use Authorization (EUA). This EUA will remain in effect (meaning this test can be used) for the duration of the COVID-19 declaration under Section 564(b)(1) of the Act, 21 U.S.C. section 360bbb-3(b)(1), unless the authorization is terminated or revoked.     Resp Syncytial Virus by PCR NEGATIVE NEGATIVE Final    Comment: (NOTE) Fact Sheet for Patients: BloggerCourse.com  Fact Sheet for Healthcare Providers: SeriousBroker.it  This test is not yet approved or cleared by the [place]  FDA and has been authorized for detection and/or diagnosis of  SARS-CoV-2 by FDA under an Emergency Use Authorization (EUA). This EUA will remain in effect (meaning this test can be used) for the duration of the COVID-19 declaration under Section 564(b)(1) of the Act, 21 U.S.C. section 360bbb-3(b)(1), unless the authorization is terminated or revoked.  Performed at [facility], [address] [place], [place] [address]     Labs: CBC: Recent Labs  Lab 08/27/23 0111 08/28/23 0105 08/29/23 0341  WBC 17.6* 8.0 8.5  NEUTROABS 15.6*  --  5.3  HGB 15.7 13.1 14.4  HCT 45.0 37.9* 41.3  MCV 93.0 92.4 91.6  PLT 213 194 193   Basic Metabolic Panel: Recent Labs  Lab 08/27/23 0111 08/28/23 0105 08/29/23 0341  NA 135 136 132*  K 3.1* 4.3 4.1  CL 103 107 103  CO2 21* 25 24  GLUCOSE 100* 90 87  BUN 13 12 16   CREATININE 0.97 1.00 1.01  CALCIUM  9.7 9.1 9.5  MG 1.5* 1.9  --    Liver Function Tests: Recent Labs  Lab 08/27/23 0111  AST 36  ALT 42  ALKPHOS 52  BILITOT 0.6  PROT 7.1  ALBUMIN 4.4   CBG: No results for input(s): GLUCAP in the last 168 hours.  Discharge time spent:  37 minutes.  Signed: Drue ONEIDA Potter, MD [facility] 08/29/2023

## 2023-08-29 NOTE — ED Triage Notes (Signed)
 Pt to ED via EMS, pt is homeless was d/c from hospital earlier today. Pt states he was told he had a room at a shelter, pt reports once he got there he had no available beds. Pt reports he cannot stay outside. No medical complaints at this time

## 2023-08-29 NOTE — Discharge Instructions (Signed)
Shelters Resource List  [employer] [facility] PROVIDED BY: [facility] [address] [place], [place], [place] Offers a faith-based shelter for homeless men, usually with substance use disorders. Residents receive counseling, life skills training, and help finding a job.  HOMELESS SHELTER PROVIDED BY: [facility] [address] [place], [place], [place] Offers a shelter for men, women, and families experiencing homelessness. Food, clothing and other items are available for residents. Also offers support and services to help residents become self-sufficient. Offers temporary emergency housing for 30 days. Additional shelter may be available when temperatures drop below freezing but is not guaranteed.  FAMILY [facility]BUSE SERVICES OF [facility] COUNTY PROVIDED BY: FAMILY [facility] OF [facility] [address], [place], [place] Offers services for victims of domestic violence. Offers a 24-hour crisis line and emergency shelter. Offers information and referrals to other community resources. Also offers court advocacy and support groups.  HOUSING CHOICE VOUCHER PROGRAM PROVIDED BY: [facility] [place] [address], [place], [place] Offers vouchers for approved Section 8 properties. Vouchers offer financial help with rent    [employer] PROVIDED BY: [facility], [place], [place] Offers emergency shelter for victims of domestic violence. Also offers a 24-hour crisis hotline for victims of domestic violence, safety planning, information and referrals, case management, and support groups for victims of domestic violence.   ACT [facility]GETHER EMERGENCY SHELTER PROVIDED BY: [facility] [address], [place], [place] Offers a 21-day emergency shelter for youth experiencing a family crisis, abuse, or homelessness. Case management, supportive services, healthcare services, and more are available for  residents. SHELTER PROVIDED BY: [facility] [address], [place], [place] Offers a homeless shelter for people and families. Meals, showers, community referrals, case management, and more are available for residents. HEARTH TRANSITIONAL LIVING PROGRAM PROVIDED BY: [facility] [address], [place], [place] Offers an 42-month homeless shelter for younger adults experiencing homelessness. Case management, independent living skills education, and more are available for residents. [facility] PROVIDED BY: [facility] [address], [place], [place] Offers transitional housing for families and single people experiencing homelessness. Residents meet regularly with a case manager to work towards self-sufficiency TRANSITIONAL HOUSING PROVIDED BY: [facility] [address], [place], [place] Offers transitional housing for male veterans with disabilities. Residents receive meals, transportation, and clothing. Also offers support groups, nutrition classes, and peer support to residents.   [facility] HOUSE PROVIDED BY: [place] [facility] [address], [place], [place] Offers shelter to adult men and women. Guests receive hot meals and case management. Also offers overnight shelter when temperatures drop during cold winter months  EMERGENCY FAMILY SHELTER PROVIDED BY: [facility] [address], [place], [place] Offers shelter and support services for families experiencing homelessness.

## 2023-08-29 NOTE — ED Triage Notes (Signed)
 EMS brings pt in after being d/c to homeless shelter this morning and was brought back because he shouldn't have been discharged; pt with no c/o

## 2023-08-29 NOTE — Care Management Important Message (Signed)
 Important Message  Patient Details  Name: Bryce Haney MRN: [id number] Date of Birth: [birth year]   Important Message Given:  Yes - Medicare IM     [name] 08/29/2023, 12:45 PM

## 2023-08-29 NOTE — Progress Notes (Signed)
 Physical Therapy Treatment Patient Details Name: Bryce Haney MRN: [id number] DOB: [birth year] Today's Date: 08/29/2023   History of Present Illness Pt is a 73 yo male that presented to the ED for increased chest discomfort, also a recent fall at home. PMH of afib, back pain, CHF, COPD, HLD, MI s/p CABG, gout, depression, chronic pain syndrome, tremors.    PT Comments  Patient aggravated at current situation on arrival. Impulsive with mobility and stating I'm just going to walk to the bathroom. I don't care if I fall or pass out. Required close supervision for safety to ambulate to/from bathroom with no AD. Returned self to bed and declined further mobility. Discharge plan remains appropriate.     If plan is discharge home, recommend the following: A little help with walking and/or transfers;A little help with bathing/dressing/bathroom;Assistance with cooking/housework;Assist for transportation;Help with stairs or ramp for entrance   Can travel by private vehicle        Equipment Recommendations  Rolling Dondrea Clendenin (2 wheels)    Recommendations for Other Services       Precautions / Restrictions Precautions Precautions: Fall Recall of Precautions/Restrictions: Intact Restrictions Weight Bearing Restrictions Per Provider Order: No     Mobility  Bed Mobility Overal bed mobility: Modified Independent                  Transfers Overall transfer level: Needs assistance Equipment used: None Transfers: Sit to/from Stand Sit to Stand: Supervision                Ambulation/Gait Ambulation/Gait assistance: Supervision Gait Distance (Feet): 25 Feet Assistive device: None Gait Pattern/deviations: Step-through pattern, Decreased stride length, Trunk flexed Gait velocity: WFL Gait velocity interpretation: >2.62 ft/sec, indicative of community ambulatory   General Gait Details: impulsive and aggravated at conversation between him and MD prior to session. Supervision for  safety to ambulate to/from bathroom   Stairs             Wheelchair Mobility     Tilt Bed    Modified Rankin (Stroke Patients Only)       Balance Overall balance assessment: Mild deficits observed, not formally tested                                          Communication Communication Communication: Impaired Factors Affecting Communication: Hearing impaired  Cognition Arousal: Alert Behavior During Therapy: Anxious, Agitated   PT - Cognitive impairments: No apparent impairments                         Following commands: Intact      Cueing Cueing Techniques: Verbal cues  Exercises      General Comments        Pertinent Vitals/Pain Pain Assessment Pain Assessment: No/denies pain    Home Living                          Prior Function            PT Goals (current goals can now be found in the care plan section) Acute Rehab PT Goals Patient Stated Goal: to find somewhere to live PT Goal Formulation: With patient Time For Goal Achievement: 09/10/23 Potential to Achieve Goals: Good Progress towards PT goals: Progressing toward goals    Frequency    Min 1X/week  PT Plan      Co-evaluation              AM-PAC PT 6 Clicks Mobility   Outcome Measure  Help needed turning from your back to your side while in a flat bed without using bedrails?: A Little Help needed moving from lying on your back to sitting on the side of a flat bed without using bedrails?: A Little Help needed moving to and from a bed to a chair (including a wheelchair)?: A Little Help needed standing up from a chair using your arms (e.g., wheelchair or bedside chair)?: A Little Help needed to walk in hospital room?: A Little Help needed climbing 3-5 steps with a railing? : A Little 6 Click Score: 18    End of Session   Activity Tolerance: Patient tolerated treatment well Patient left: in bed;with bed alarm set;with call  bell/phone within reach Nurse Communication: Mobility status PT Visit Diagnosis: Other abnormalities of gait and mobility (R26.89);Difficulty in walking, not elsewhere classified (R26.2);Muscle weakness (generalized) (M62.81)     Time: 9057-9042 PT Time Calculation (min) (ACUTE ONLY): 15 min  Charges:    $Therapeutic Activity: 8-22 mins PT General Charges $$ ACUTE PT VISIT: 1 Visit                     Maryanne Finder, PT, DPT Physical Therapist - [facility]  [facility]    Bryce Haney A Bryce Haney 08/29/2023, 12:41 PM

## 2023-08-29 NOTE — TOC Progression Note (Signed)
 Transition of Care [facility]) - Progression Note    Patient Details  Name: Bryce Haney MRN: [id number] Date of Birth: 1950-12-30  Transition of Care [facility]) CM/SW Contact  Tomasa JAYSON Childes, RN Phone Number: 08/29/2023, 12:24 PM  Clinical Narrative:    Spoke with patient numeros times at bedside.  Patient stated he is from [place]  and was staying with a friend. Since admission her at [facility] the patient is not allowed to return to previous address.  Patient stated he has $12. He requested to contact his ex-wife, Andrea contacted at [phone]. She informed patient  she does not have money for a bus ticket for him. Patient gave permission to contact Ron the home owner from where he came. Ron stated he was preparing for a interview but would allow patient to return to his home to to get his belongings.  Ron stated patient Bryce return once he calls in an approx one hour.   12:15 pm Ron contacted this RNCM to advise he was at home and patient could come get his belongings.  12: 20pm Ron called this RNCM requested for patient not to return to his home.  Nurse notified.            Expected Discharge Plan and Services         Expected Discharge Date: 08/29/23                                     Social Determinants of Health (SDOH) Interventions SDOH Screenings   Food Insecurity: No Food Insecurity (08/27/2023)  Housing: Unknown (08/27/2023)  Transportation Needs: Patient Declined (08/27/2023)  Utilities: Not At Risk (08/27/2023)  Depression (PHQ2-9): Low Risk  (04/20/2018)  Financial Resource Strain: Low Risk  (10/12/2017)  Physical Activity: Unknown (10/12/2017)  Social Connections: Patient Declined (08/27/2023)  Stress: No Stress Concern Present (10/12/2017)  Tobacco Use: High Risk (08/27/2023)    Readmission Risk Interventions     No data to display

## 2023-08-29 NOTE — ED Triage Notes (Signed)
 Pt was here earlier this evening and left...SABRASABRASABRAreturns because he has nowhere to go; pt denies any specific c/o

## 2023-08-29 NOTE — ED Notes (Signed)
 No answer when called several times from lobby

## 2023-08-29 NOTE — Progress Notes (Signed)
 Discharge paperwork reviewed with patient, pt is upset that he has to go to homeless shelter but understands his options. Resources for other shelters given to pt. Pt wheel chaired to front, [employer] [facility] has arrived to take pt to shelter on [address]   PIV removed

## 2023-08-30 ENCOUNTER — Emergency Department

## 2023-08-30 ENCOUNTER — Emergency Department
Admission: EM | Admit: 2023-08-30 | Discharge: 2023-08-30 | Discharge status: Against Medical Advice | Source: Home / Self Care

## 2023-08-30 ENCOUNTER — Other Ambulatory Visit: Payer: Self-pay

## 2023-08-30 ENCOUNTER — Emergency Department
Admission: EM | Admit: 2023-08-30 | Discharge: 2023-08-30 | Disposition: A | Discharge status: Home / Self Care | Attending: Emergency Medicine | Admitting: Emergency Medicine

## 2023-08-30 DIAGNOSIS — S79911A Unspecified injury of right hip, initial encounter: Secondary | ICD-10-CM | POA: Diagnosis present

## 2023-08-30 DIAGNOSIS — Y9301 Activity, walking, marching and hiking: Secondary | ICD-10-CM | POA: Insufficient documentation

## 2023-08-30 DIAGNOSIS — S86911A Strain of unspecified muscle(s) and tendon(s) at lower leg level, right leg, initial encounter: Secondary | ICD-10-CM | POA: Diagnosis not present

## 2023-08-30 DIAGNOSIS — S76011A Strain of muscle, fascia and tendon of right hip, initial encounter: Secondary | ICD-10-CM | POA: Insufficient documentation

## 2023-08-30 DIAGNOSIS — E039 Hypothyroidism, unspecified: Secondary | ICD-10-CM | POA: Diagnosis not present

## 2023-08-30 DIAGNOSIS — W1842XA Slipping, tripping and stumbling without falling due to stepping into hole or opening, initial encounter: Secondary | ICD-10-CM | POA: Insufficient documentation

## 2023-08-30 DIAGNOSIS — I2510 Atherosclerotic heart disease of native coronary artery without angina pectoris: Secondary | ICD-10-CM | POA: Insufficient documentation

## 2023-08-30 DIAGNOSIS — Y92410 Unspecified street and highway as the place of occurrence of the external cause: Secondary | ICD-10-CM | POA: Diagnosis not present

## 2023-08-30 LAB — LIPOPROTEIN A (LPA): Lipoprotein (a): 9.8 nmol/L (ref <75.0)

## 2023-08-30 NOTE — TOC Initial Note (Signed)
 Transition of Care [facility]) - Initial/Assessment Note    Patient Details  Name: Bryce Haney MRN: [id number] Date of Birth: [birth year]  Transition [facility] Care [facility]) CM/SW Contact:    [place] L Quante Pettry, LCSW Phone Number: 08/30/2023, 9:45 AM  Clinical Narrative:                    CSW completed a brief assessment with patient. Pt has no PT/OT needs. SNF placement is not an option. Pt moved to [place] from [place]. He was staying with a friend but the friend put him out. Pt was discharged from ED a day ago but he returned after shelter asked him to leave. Pt has no financial resources after paying his friend for housing. Pt was offered a shelter list in ED and TOC provided additional resources. Pt is medically cleared for discharge. TOC signing off.      Patient Goals and CMS Choice            Expected Discharge Plan and Services                                              Prior Living Arrangements/Services                       Activities of Daily Living      Permission Sought/Granted                  Emotional Assessment              Admission diagnosis:  Hip Pain Patient Active Problem List   Diagnosis Date Noted   Atypical chest pain 08/28/2023   Chest pain of uncertain etiology 08/27/2023   Shortness of breath 08/27/2023   Fall 08/27/2023   Rib pain on left side 08/27/2023   Spondylosis of cervical region without myelopathy or radiculopathy 10/27/2017   DDD (degenerative disc disease), cervical 10/27/2017   Cervical facet joint syndrome 10/27/2017   Chronic pain syndrome 10/27/2017   Special screening for malignant neoplasms, colon    Lower GI bleed 10/19/2016   Leukocytosis 10/19/2016   Diverticulitis large intestine 10/19/2016   Acute renal insufficiency 10/19/2016   Hyponatremia 10/19/2016   Coronary artery disease involving autologous artery coronary bypass graft 06/30/2016   PAF (paroxysmal atrial fibrillation) (HCC) 07/08/2015    Pneumonia 06/19/2015   Phlebitis and thrombophlebitis of superficial vessels of lower extremities, bilateral 06/05/2015   S/P CABG x 2 05/29/2015   Chronic depression 05/23/2015   GERD (gastroesophageal reflux disease) 05/23/2015   CAD (coronary artery disease) 11/01/2012   Mixed hyperlipidemia 11/01/2012   Hypothyroidism, unspecified 10/25/2012   STEMI (ST elevation myocardial infarction) (HCC) 10/25/2012   PCP:  Freddrick, No Pharmacy:   [facility] - [place], [place] - [address] [place] [address] Phone: [phone] Fax: [phone]  [facility] [address]),  - [address] [place]) [place] [address] Phone: [phone] Fax: [phone]     Social Drivers of Health (SDOH) Social History: SDOH Screenings   Food Insecurity: No Food Insecurity (08/27/2023)  Housing: Unknown (08/27/2023)  Transportation Needs: Patient Declined (08/27/2023)  Utilities: Not At Risk (08/27/2023)  Depression (PHQ2-9): Low Risk  (04/20/2018)  Financial Resource Strain: Low Risk  (10/12/2017)  Physical Activity: Unknown (10/12/2017)  Social Connections: Patient Declined (08/27/2023)  Stress: No Stress Concern Present (10/12/2017)  Tobacco Use: High Risk (08/30/2023)   SDOH Interventions:     Readmission Risk Interventions     No data to display

## 2023-08-30 NOTE — ED Provider Notes (Signed)
 [facility] Provider Note    Event Date/Time   First MD Initiated Contact with Patient 08/30/23 [phone]     (approximate)   History   Hip Pain   HPI  Bryce Haney is a 73 y.o. male with history of CAD, hypothyroidism, STEMI, degenerative disc disease and as listed in EMR presents to the emergency department for treatment and evaluation after stepping in a hole and falling last night.  He heard something pop in the right hip and knee.  He was able to continue walking to the homeless shelter.    He states that he had presented to the emergency department twice yesterday because he felt like he was overheated and just needed to be in the air conditioning but was unable to just sit in the lobby.  He was not evaluated by a provider either time.  He decided to get up and walk from here to the shelter which is the reason that he fell.  The shelter does not have any beds available and the patient states that he does not have any money.      Physical Exam   Triage Vital Signs: ED Triage Vitals  Encounter Vitals Group     BP 08/30/23 0707 103/63     Girls Systolic BP Percentile --      Girls Diastolic BP Percentile --      Boys Systolic BP Percentile --      Boys Diastolic BP Percentile --      Pulse Rate 08/30/23 0704 70     Resp 08/30/23 0704 16     Temp 08/30/23 0704 98.1 F (36.7 C)     Temp Source 08/30/23 0704 Oral     SpO2 08/30/23 0704 97 %     Weight 08/30/23 0707 149 lb 14.6 oz (68 kg)     Height 08/30/23 0707 5' 8 (1.727 m)     Head Circumference --      Peak Flow --      Pain Score 08/30/23 0708 9     Pain Loc --      Pain Education --      Exclude from Growth Chart --     Most recent vital signs: Vitals:   08/30/23 0704 08/30/23 0707  BP:  103/63  Pulse: 70 99  Resp: 16 20  Temp: 98.1 F (36.7 C) 98.6 F (37 C)  SpO2: 97% 96%    General: Awake, no distress.  CV:  Good peripheral perfusion.  Resp:  Normal effort.  Abd:  No  distention.  Other:  Demonstrates FROM of right hip and knee. No deformity of RLE. Leg is not shortened or rotated.   ED Results / Procedures / Treatments   Labs (all labs ordered are listed, but only abnormal results are displayed) Labs Reviewed - No data to display   EKG  Not indicated   RADIOLOGY  Image and radiology report reviewed and interpreted by me. Radiology report consistent with the same.  Images of the right knee and right hip are negative for acute concerns.  PROCEDURES:  Critical Care performed: No  Procedures   MEDICATIONS ORDERED IN ED:  Medications - No data to display   IMPRESSION / MDM / ASSESSMENT AND PLAN / ED COURSE   I have reviewed the triage note.  Differential diagnosis includes, but is not limited to, malingering, right hip strain, right knee strain  Patient's presentation is most consistent with acute illness / injury with system  symptoms.  73 year old male presenting to the emergency department for treatment evaluation after reportedly falling and injuring his right hip and right knee while walking from the ER to the homeless shelter last night.  See HPI for further details.  Vital signs are normal.  Imaging and exam are consistent.  Results reviewed with the patient.  He reports that he knew he did not have anything broken but needed to come back into the hospital to see what we could do about his homeless state.  Social work was contacted and she did come down and provide him with resources list for the community.  He was advised that other than this we do not have any services available through the hospital.  Discharge paperwork included the address for a place called [facility] which the patient heard about from the local homeless shelter however it is in [place] and he does not have any transportation. His plan is to go lay in the grass near the [employer]. I advised him to contact the [facility] Department to see if they  have any additional options. Patient ambulated out of the ER without assistance.      FINAL CLINICAL IMPRESSION(S) / ED DIAGNOSES   Final diagnoses:  Hip strain, right, initial encounter  Knee strain, right, initial encounter     Rx / DC Orders   ED Discharge Orders     None        Note:  This document was prepared using Dragon voice recognition software and may include unintentional dictation errors.   Herlinda Kirk NOVAK, FNP 08/30/23 1031    Bradler, Evan K, MD 08/30/23 [phone]

## 2023-08-30 NOTE — Discharge Instructions (Addendum)
 [address] [place], [place] [address] [phone]

## 2023-08-30 NOTE — ED Notes (Signed)
 No answer when called several times from lobby

## 2023-08-30 NOTE — ED Notes (Signed)
 See triage note  Presents with pain to right hip and knee pain  States he stepped wrong and felt a pop  Was able to make it too the shelter  States he is homeless

## 2023-08-30 NOTE — ED Triage Notes (Signed)
 Pt to ED for R hip and knee pain since last night. Pt was walking on the street and stepped in a hole and felt something pop to both R hip and knee. Pt is homeless. Pt also came here yesterday.

## 2023-08-31 ENCOUNTER — Emergency Department
Admission: EM | Admit: 2023-08-31 | Discharge: 2023-08-31 | Disposition: A | Discharge status: Home / Self Care | Attending: Emergency Medicine | Admitting: Emergency Medicine

## 2023-08-31 ENCOUNTER — Encounter: Payer: Self-pay | Admitting: Emergency Medicine

## 2023-08-31 ENCOUNTER — Other Ambulatory Visit: Payer: Self-pay

## 2023-08-31 DIAGNOSIS — Z5900 Homelessness unspecified: Secondary | ICD-10-CM | POA: Insufficient documentation

## 2023-08-31 DIAGNOSIS — E871 Hypo-osmolality and hyponatremia: Secondary | ICD-10-CM | POA: Diagnosis not present

## 2023-08-31 DIAGNOSIS — E860 Dehydration: Secondary | ICD-10-CM | POA: Diagnosis present

## 2023-08-31 DIAGNOSIS — J449 Chronic obstructive pulmonary disease, unspecified: Secondary | ICD-10-CM | POA: Insufficient documentation

## 2023-08-31 DIAGNOSIS — N179 Acute kidney failure, unspecified: Secondary | ICD-10-CM | POA: Insufficient documentation

## 2023-08-31 DIAGNOSIS — R110 Nausea: Secondary | ICD-10-CM | POA: Insufficient documentation

## 2023-08-31 DIAGNOSIS — I509 Heart failure, unspecified: Secondary | ICD-10-CM | POA: Insufficient documentation

## 2023-08-31 LAB — BASIC METABOLIC PANEL WITH GFR
Anion gap: 14 (ref 5–15)
BUN: 40 mg/dL — ABNORMAL HIGH (ref 8–23)
CO2: 19 mmol/L — ABNORMAL LOW (ref 22–32)
Calcium: 9.8 mg/dL (ref 8.9–10.3)
Chloride: 99 mmol/L (ref 98–111)
Creatinine, Ser: 1.57 mg/dL — ABNORMAL HIGH (ref 0.61–1.24)
GFR, Estimated: 47 mL/min — ABNORMAL LOW (ref >60)
Glucose, Bld: 80 mg/dL (ref 70–99)
Potassium: 4.2 mmol/L (ref 3.5–5.1)
Sodium: 132 mmol/L — ABNORMAL LOW (ref 135–145)

## 2023-08-31 LAB — CBC WITH DIFFERENTIAL/PLATELET
Abs Immature Granulocytes: 0.08 10*3/uL — ABNORMAL HIGH (ref 0.00–0.07)
Basophils Absolute: 0.1 10*3/uL (ref 0.0–0.1)
Basophils Relative: 1 %
Eosinophils Absolute: 0.1 10*3/uL (ref 0.0–0.5)
Eosinophils Relative: 0 %
HCT: 49.2 % (ref 39.0–52.0)
Hemoglobin: 17.1 g/dL — ABNORMAL HIGH (ref 13.0–17.0)
Immature Granulocytes: 1 %
Lymphocytes Relative: 10 %
Lymphs Abs: 1.7 10*3/uL (ref 0.7–4.0)
MCH: 32 pg (ref 26.0–34.0)
MCHC: 34.8 g/dL (ref 30.0–36.0)
MCV: 92 fL (ref 80.0–100.0)
Monocytes Absolute: 1.1 10*3/uL — ABNORMAL HIGH (ref 0.1–1.0)
Monocytes Relative: 6 %
Neutro Abs: 13.9 10*3/uL — ABNORMAL HIGH (ref 1.7–7.7)
Neutrophils Relative %: 82 %
Platelets: 226 10*3/uL (ref 150–400)
RBC: 5.35 MIL/uL (ref 4.22–5.81)
RDW: 12.6 % (ref 11.5–15.5)
WBC: 16.9 10*3/uL — ABNORMAL HIGH (ref 4.0–10.5)
nRBC: 0 % (ref 0.0–0.2)

## 2023-08-31 MED ORDER — DEXTROSE IN LACTATED RINGERS 5 % IV SOLN
1000.0000 mL | Freq: Once | INTRAVENOUS | Status: AC
  Administered 2023-08-31: 1000 mL via INTRAVENOUS

## 2023-08-31 MED ORDER — DEXTROSE 5 % IN LACTATED RINGERS IV BOLUS
1000.0000 mL | Freq: Once | INTRAVENOUS | Status: AC
  Administered 2023-08-31: 1000 mL via INTRAVENOUS
  Filled 2023-08-31: qty 1000

## 2023-08-31 MED ORDER — ONDANSETRON 4 MG PO TBDP
4.0000 mg | ORAL_TABLET | Freq: Once | ORAL | Status: AC
  Administered 2023-08-31: 4 mg via ORAL
  Filled 2023-08-31: qty 1

## 2023-08-31 NOTE — ED Triage Notes (Signed)
 Pt discharged shortly ago and returns stating he was having CP and vomiting with diarrhea.  Pt reports he is newly homeless as his roommate stated he was not welcome back.  States he was referred to a shelter but no shelters have openings.

## 2023-08-31 NOTE — ED Triage Notes (Signed)
 Per EMS, pt laying in a field next to a hospital since Monday due to back pain hip pain and leg, BGL 66  and has not eaten since Monday, no BM since last Wednesday, pt reports I have no money for food or my belongings, and no way to get to [place] to the shelter.

## 2023-08-31 NOTE — ED Provider Notes (Addendum)
 [facility] Provider Note    Event Date/Time   First MD Initiated Contact with Patient 08/31/23 [phone]     (approximate)   History   Back Pain   HPI Bryce Haney is a 73 y.o. male presenting today for homelessness.  Patient has been to the ER 4 times in the past 2 days due to homelessness.  He has several chronic pain complaints to his back and lower extremities which have already been evaluated with no concerning findings.  He states he is here because of his homelessness.  He states he came in to be able to get a bite to eat.  He denies any any acute symptoms otherwise at this time.  No recent changes.  Stated he left the ER yesterday and went to sleep in a field.  Did not follow-up with any of the recommendations from social work for homeless shelters in the area.  He specifically denies any new cough, congestion, shortness of breath, nausea, vomiting, dysuria, diarrhea, constipation, abdominal pain, or chest pain.     Physical Exam   Triage Vital Signs: ED Triage Vitals  Encounter Vitals Group     BP 08/31/23 0620 137/81     Girls Systolic BP Percentile --      Girls Diastolic BP Percentile --      Boys Systolic BP Percentile --      Boys Diastolic BP Percentile --      Pulse Rate 08/31/23 0620 72     Resp 08/31/23 0620 20     Temp 08/31/23 0623 97.7 F (36.5 C)     Temp Source 08/31/23 0620 Oral     SpO2 08/31/23 0618 100 %     Weight --      Height --      Head Circumference --      Peak Flow --      Pain Score --      Pain Loc --      Pain Education --      Exclude from Growth Chart --     Most recent vital signs: Vitals:   08/31/23 0620 08/31/23 0623  BP: 137/81   Pulse: 72   Resp: 20   Temp:  97.7 F (36.5 C)  SpO2: 100%    I have reviewed the vital signs. General:  Awake, alert, no acute distress. Head:  Normocephalic, Atraumatic. EENT:  PERRL, EOMI, Oral mucosa pink and moist, Neck is supple. Cardiovascular: Regular  rate, 2+ distal pulses. Respiratory:  Normal respiratory effort, symmetrical expansion, no distress.   Extremities:  Moving all four extremities through full ROM without pain.   Neuro:  Alert and oriented.  Interacting appropriately.   Skin:  Warm, dry, no rash.   Psych: Appropriate affect.    ED Results / Procedures / Treatments   Labs (all labs ordered are listed, but only abnormal results are displayed) Labs Reviewed  CBC WITH DIFFERENTIAL/PLATELET - Abnormal; Notable for the following components:      Result Value   WBC 16.9 (*)    Hemoglobin 17.1 (*)    Neutro Abs 13.9 (*)    Monocytes Absolute 1.1 (*)    Abs Immature Granulocytes 0.08 (*)    All other components within normal limits  BASIC METABOLIC PANEL WITH GFR - Abnormal; Notable for the following components:   Sodium 132 (*)    CO2 19 (*)    BUN 40 (*)    Creatinine, Ser 1.57 (*)  GFR, Estimated 47 (*)    All other components within normal limits     EKG My EKG interpretation: Rate of 69, normal sinus rhythm, normal axis, normal intervals.  No acute ST elevations or depressions   RADIOLOGY    PROCEDURES:  Critical Care performed: No  Procedures   MEDICATIONS ORDERED IN ED: Medications  dextrose  5 % in lactated ringers  infusion (has no administration in time range)  dextrose  5% lactated ringers  bolus 1,000 mL (1,000 mLs Intravenous New Bag/Given 08/31/23 9371)     IMPRESSION / MDM / ASSESSMENT AND PLAN / ED COURSE  I reviewed the triage vital signs and the nursing notes.                              Differential diagnosis includes, but is not limited to, homelessness, malingering  Patient's presentation is most consistent with acute complicated illness / injury requiring diagnostic workup.  Patient is a 73 year old male presenting today for homelessness.  He reports coming back to the ER to me in order to have a bite to eat.  He has chronic pain symptoms in his back and lower extremities which  have already been evaluated.  He was seen in the ER yesterday with complete workup of his pain complaints with negative x-rays.  Vital signs are stable and physical exam is unremarkable.  EMS reported blood sugar was 66 so we will get CBC and BMP and give 1 L of D5LR.  Will provide with p.o. food.  Patient states he did not follow-up with any of the social worker recommendations from yesterday.  He was not a candidate for SNF as he is able to ambulate with no PT or OT concerns.  I stated to patient that there is nothing further we can provide in the ER regarding his unfortunate social situation at this time.  Specifically, he denies any acute new complaints such as cough, congestion, shortness of breath, chest pain, abdominal pain, nausea, vomiting, diarrhea, constipation, dysuria to indicate any new infectious symptoms.  BMP does show a mild AKI with creatinine of 1.57 and some mild hyponatremia.  Likely indicative of dehydration.  Will give a second liter of fluids while he is here but since he is able to tolerate p.o. without any concerns, do not see any direct admission criteria at this time.  Patient will be discharged and once again emphasized that he follow-up with resources given to him by the social worker.  Patient stated to me that when he gets discharged he is either going to go lay back out in the field or call the police to come arrest him.  When I asked why he did not want to follow-up with any of the resources provided regarding homeless shelters in the area, he stated because he did not want to stay at those places.  The patient is on the cardiac monitor to evaluate for evidence of arrhythmia and/or significant heart rate changes. Clinical Course as of 08/31/23 0653  Wed Aug 31, 2023  0640 CBC with Differential(!) Hemoconcentration likely from dehydration. No infectious symptoms [DW]  0650 Basic metabolic panel(!) Mild AKI which is not surprising given the heat yesterday and him stating he  has not ate or drink.  Will give total of 2 L of fluid.  He otherwise does not meet any admission criteria at this time as he is tolerating p.o. without issue and will be well-hydrated following 2 L.  Will discharge afterwards. [DW]    Clinical Course User Index [DW] Malvina Alm DASEN, MD     FINAL CLINICAL IMPRESSION(S) / ED DIAGNOSES   Final diagnoses:  Homelessness  AKI (acute kidney injury) (HCC)  Dehydration     Rx / DC Orders   ED Discharge Orders     None        Note:  This document was prepared using Dragon voice recognition software and may include unintentional dictation errors.   Malvina Alm DASEN, MD 08/31/23 SCOTTY    Malvina Alm DASEN, MD 08/31/23 [phone]

## 2023-08-31 NOTE — Discharge Instructions (Signed)
 Please use the resources provided by the social workers to follow-up with the homeless shelters in the area.

## 2023-08-31 NOTE — ED Provider Notes (Signed)
 [facility] Provider Note    Event Date/Time   First MD Initiated Contact with Patient 08/31/23 1710     (approximate)  History   Chief Complaint: Emesis  HPI  Bryce Haney is a 73 y.o. male with a past Eckel history of CHF, COPD, hyperlipidemia, recently homeless, presents to the emergency department for nausea.  According to the patient he has been experiencing nausea throughout the day.  Patient was seen in the emergency department earlier today as well as 4 additional times of last couple days.  Patient admits that he is homeless and he has nowhere else to go.  States he is hungry and is asking for something to eat and drink.  Patient states he tried going to the homeless shelter but was turned away.  Patient says he does not get a so security check until 7/3, once he gets this check he will no longer be homeless.  Physical Exam   Triage Vital Signs: ED Triage Vitals  Encounter Vitals Group     BP 08/31/23 1705 128/78     Girls Systolic BP Percentile --      Girls Diastolic BP Percentile --      Boys Systolic BP Percentile --      Boys Diastolic BP Percentile --      Pulse Rate 08/31/23 1705 65     Resp 08/31/23 1705 19     Temp 08/31/23 1705 98.6 F (37 C)     Temp Source 08/31/23 1705 Oral     SpO2 08/31/23 1705 100 %     Weight --      Height --      Head Circumference --      Peak Flow --      Pain Score 08/31/23 1702 10     Pain Loc --      Pain Education --      Exclude from Growth Chart --     Most recent vital signs: Vitals:   08/31/23 1705  BP: 128/78  Pulse: 65  Resp: 19  Temp: 98.6 F (37 C)  SpO2: 100%    General: Awake, no distress.  CV:  Good peripheral perfusion.   Resp:  Normal effort.  Abd:  No distention.    ED Results / Procedures / Treatments    MEDICATIONS ORDERED IN ED: Medications  ondansetron  (ZOFRAN -ODT) disintegrating tablet 4 mg (has no administration in time range)     IMPRESSION / MDM /  ASSESSMENT AND PLAN / ED COURSE  I reviewed the triage vital signs and the nursing notes.  Patient's presentation is most consistent with acute illness / injury with system symptoms.  Patient presents emergency department for nausea.  I reviewed the patient's workup from earlier today showing reassuring results.  Patient was given IV fluids at that time.  Patient's vital signs are reassuring and the patient appears well.  In further discussion the patient admits that he is homeless and states he has no money in order to go which is why he came back here hoping for something to eat and drink.  We will get the patient something to drink and we will give a one-time dose of Zofran .  I discussed with the patient he needs to follow-up with the homeless shelters in the area which she has been given resources for.  Patient states he will call but last time he called no one had availability.  Patient was stating that he was having some chest  pain earlier today I reviewed the EKG which shows a normal sinus rhythm at 78 bpm with a narrow QRS, left axis deviation, largely normal intervals with no concerning ST changes.  Discussed with the patient that although I sympathize with him, from an emergency department there is not much that we can offer and that the patient needs to follow-up with the resources that have been provided to him.    FINAL CLINICAL IMPRESSION(S) / ED DIAGNOSES   Homelessness Nausea  Note:  This document was prepared using Dragon voice recognition software and may include unintentional dictation errors.   Dorothyann Drivers, MD 08/31/23 1729

## 2023-08-31 NOTE — ED Notes (Signed)
 Patient assisted to use toilet by this RN and Neurosurgeon. Patient had an unsteady gait and was SOB the entire ambulation. Patient states I have issues with my heart. MD Jossie made aware and states it is okay to discharge.
